# Patient Record
Sex: Female | Born: 1945 | Race: White | Hispanic: No | Marital: Married | Smoking: Never smoker
Health system: Southern US, Community
[De-identification: ages and names within clinical notes are randomized; demographics above are authoritative.]

## PROBLEM LIST (undated history)

## (undated) DIAGNOSIS — E785 Hyperlipidemia, unspecified: Secondary | ICD-10-CM

## (undated) DIAGNOSIS — I4891 Unspecified atrial fibrillation: Secondary | ICD-10-CM

## (undated) DIAGNOSIS — E039 Hypothyroidism, unspecified: Secondary | ICD-10-CM

## (undated) DIAGNOSIS — M810 Age-related osteoporosis without current pathological fracture: Secondary | ICD-10-CM

## (undated) DIAGNOSIS — I10 Essential (primary) hypertension: Secondary | ICD-10-CM

## (undated) DIAGNOSIS — J189 Pneumonia, unspecified organism: Secondary | ICD-10-CM

## (undated) DIAGNOSIS — E079 Disorder of thyroid, unspecified: Secondary | ICD-10-CM

## (undated) HISTORY — DX: Disorder of thyroid, unspecified: E07.9

## (undated) HISTORY — DX: Unspecified atrial fibrillation: I48.91

## (undated) HISTORY — PX: CARDIOVERSION: SHX1299

## (undated) HISTORY — DX: Age-related osteoporosis without current pathological fracture: M81.0

## (undated) HISTORY — DX: Essential (primary) hypertension: I10

## (undated) HISTORY — DX: Hyperlipidemia, unspecified: E78.5

---

## 2010-11-27 ENCOUNTER — Inpatient Hospital Stay (HOSPITAL_COMMUNITY)
Admission: EM | Admit: 2010-11-27 | Discharge: 2010-12-06 | DRG: 194 | Disposition: A | Discharge status: Home / Self Care | Payer: Medicare Other | Attending: Internal Medicine | Admitting: Internal Medicine

## 2010-11-27 ENCOUNTER — Emergency Department (HOSPITAL_COMMUNITY): Admit: 2010-11-27 | Discharge: 2010-11-27 | Disposition: A | Discharge status: Home / Self Care | Payer: Medicare Other

## 2010-11-27 DIAGNOSIS — T380X5A Adverse effect of glucocorticoids and synthetic analogues, initial encounter: Secondary | ICD-10-CM | POA: Diagnosis present

## 2010-11-27 DIAGNOSIS — J90 Pleural effusion, not elsewhere classified: Secondary | ICD-10-CM | POA: Diagnosis present

## 2010-11-27 DIAGNOSIS — R7309 Other abnormal glucose: Secondary | ICD-10-CM | POA: Diagnosis present

## 2010-11-27 DIAGNOSIS — E039 Hypothyroidism, unspecified: Secondary | ICD-10-CM | POA: Diagnosis present

## 2010-11-27 DIAGNOSIS — D509 Iron deficiency anemia, unspecified: Secondary | ICD-10-CM | POA: Diagnosis present

## 2010-11-27 DIAGNOSIS — E236 Other disorders of pituitary gland: Secondary | ICD-10-CM | POA: Diagnosis present

## 2010-11-27 DIAGNOSIS — N390 Urinary tract infection, site not specified: Secondary | ICD-10-CM | POA: Diagnosis present

## 2010-11-27 DIAGNOSIS — J189 Pneumonia, unspecified organism: Principal | ICD-10-CM | POA: Diagnosis present

## 2010-11-27 DIAGNOSIS — J040 Acute laryngitis: Secondary | ICD-10-CM | POA: Diagnosis present

## 2010-11-27 DIAGNOSIS — E876 Hypokalemia: Secondary | ICD-10-CM | POA: Diagnosis present

## 2010-11-27 DIAGNOSIS — I10 Essential (primary) hypertension: Secondary | ICD-10-CM | POA: Diagnosis present

## 2010-11-28 ENCOUNTER — Encounter (HOSPITAL_COMMUNITY): Payer: Self-pay

## 2010-11-28 LAB — URINE MICROSCOPIC-ADD ON

## 2010-11-28 LAB — DIFFERENTIAL
Basophils Absolute: 0 10*3/uL (ref 0.0–0.1)
Basophils Relative: 0 % (ref 0–1)
Lymphocytes Relative: 15 % (ref 12–46)
Monocytes Absolute: 0.8 10*3/uL (ref 0.1–1.0)
Neutro Abs: 3.2 10*3/uL (ref 1.7–7.7)
Neutrophils Relative %: 67 % (ref 43–77)

## 2010-11-28 LAB — BASIC METABOLIC PANEL
BUN: 4 mg/dL — ABNORMAL LOW (ref 6–23)
CO2: 26 mEq/L (ref 19–32)
CO2: 23 mEq/L (ref 19–32)
Calcium: 8.8 mg/dL (ref 8.4–10.5)
Calcium: 8.4 mg/dL (ref 8.4–10.5)
Calcium: 8.5 mg/dL (ref 8.4–10.5)
Creatinine, Ser: 0.63 mg/dL (ref 0.4–1.2)
Creatinine, Ser: 0.61 mg/dL (ref 0.4–1.2)
GFR calc Af Amer: >60 mL/min (ref >60)
GFR calc non Af Amer: >60 mL/min (ref >60)
GFR calc non Af Amer: >60 mL/min (ref >60)
GFR calc non Af Amer: >60 mL/min (ref >60)
Glucose, Bld: 132 mg/dL — ABNORMAL HIGH (ref 70–99)
Glucose, Bld: 143 mg/dL — ABNORMAL HIGH (ref 70–99)
Glucose, Bld: 117 mg/dL — ABNORMAL HIGH (ref 70–99)
Potassium: 2.8 mEq/L — ABNORMAL LOW (ref 3.5–5.1)
Sodium: 121 mEq/L — ABNORMAL LOW (ref 135–145)

## 2010-11-28 LAB — MAGNESIUM: Magnesium: 1.7 mg/dL (ref 1.5–2.5)

## 2010-11-28 LAB — CBC
HCT: 31.2 % — ABNORMAL LOW (ref 36.0–46.0)
Hemoglobin: 11.9 g/dL — ABNORMAL LOW (ref 12.0–15.0)
MCHC: 38.1 g/dL — ABNORMAL HIGH (ref 30.0–36.0)
WBC: 4.7 10*3/uL (ref 4.0–10.5)

## 2010-11-28 LAB — URINALYSIS, ROUTINE W REFLEX MICROSCOPIC
Hgb urine dipstick: NEGATIVE
Nitrite: NEGATIVE
Specific Gravity, Urine: 1.005 (ref 1.005–1.030)
Urobilinogen, UA: 0.2 mg/dL (ref 0.0–1.0)
pH: 5.5 (ref 5.0–8.0)

## 2010-11-28 LAB — HEPATIC FUNCTION PANEL
Bilirubin, Direct: 0.1 mg/dL (ref 0.0–0.3)
Indirect Bilirubin: 0.1 mg/dL — ABNORMAL LOW (ref 0.3–0.9)

## 2010-11-28 LAB — TSH: TSH: 9.818 u[IU]/mL — ABNORMAL HIGH (ref 0.350–4.500)

## 2010-11-28 LAB — T4, FREE: Free T4: 0.83 ng/dL (ref 0.80–1.80)

## 2010-11-29 ENCOUNTER — Inpatient Hospital Stay (HOSPITAL_COMMUNITY): Payer: Medicare Other

## 2010-11-29 LAB — VITAMIN B12: Vitamin B-12: 475 pg/mL (ref 211–911)

## 2010-11-29 LAB — BASIC METABOLIC PANEL
BUN: 4 mg/dL — ABNORMAL LOW (ref 6–23)
GFR calc Af Amer: >60 mL/min (ref >60)
GFR calc non Af Amer: >60 mL/min (ref >60)
Potassium: 4 mEq/L (ref 3.5–5.1)

## 2010-11-29 LAB — CBC
MCV: 86.5 fL (ref 78.0–100.0)
Platelets: 162 10*3/uL (ref 150–400)
RBC: 3.41 MIL/uL — ABNORMAL LOW (ref 3.87–5.11)
WBC: 4.2 10*3/uL (ref 4.0–10.5)

## 2010-11-29 LAB — DIFFERENTIAL
Basophils Relative: 0 % (ref 0–1)
Eosinophils Absolute: 0 10*3/uL (ref 0.0–0.7)
Lymphs Abs: 0.6 10*3/uL — ABNORMAL LOW (ref 0.7–4.0)
Neutrophils Relative %: 71 % (ref 43–77)

## 2010-11-29 LAB — OSMOLALITY, URINE: Osmolality, Ur: 489 mOsm/kg (ref 390–1090)

## 2010-11-29 LAB — URIC ACID: Uric Acid, Serum: 2.8 mg/dL (ref 2.4–7.0)

## 2010-11-29 LAB — CORTISOL: Cortisol, Plasma: 13.4 ug/dL

## 2010-11-30 LAB — DIFFERENTIAL
Basophils Absolute: 0 10*3/uL (ref 0.0–0.1)
Eosinophils Absolute: 0 10*3/uL (ref 0.0–0.7)
Eosinophils Relative: 0 % (ref 0–5)

## 2010-11-30 LAB — BASIC METABOLIC PANEL
BUN: 1 mg/dL — ABNORMAL LOW (ref 6–23)
CO2: 24 mEq/L (ref 19–32)
Calcium: 8.9 mg/dL (ref 8.4–10.5)
Chloride: 96 mEq/L (ref 96–112)
Creatinine, Ser: 0.54 mg/dL (ref 0.4–1.2)
GFR calc Af Amer: >60 mL/min (ref >60)
Glucose, Bld: 120 mg/dL — ABNORMAL HIGH (ref 70–99)

## 2010-11-30 LAB — CBC
MCH: 31.7 pg (ref 26.0–34.0)
MCHC: 36.5 g/dL — ABNORMAL HIGH (ref 30.0–36.0)
MCV: 87 fL (ref 78.0–100.0)
WBC: 6.3 10*3/uL (ref 4.0–10.5)

## 2010-11-30 LAB — URINE CULTURE
Culture: NO GROWTH
Special Requests: NEGATIVE

## 2010-12-01 LAB — CBC
HCT: 29.9 % — ABNORMAL LOW (ref 36.0–46.0)
MCV: 85.7 fL (ref 78.0–100.0)
Platelets: 193 10*3/uL (ref 150–400)
RBC: 3.49 MIL/uL — ABNORMAL LOW (ref 3.87–5.11)
RDW: 13.3 % (ref 11.5–15.5)
WBC: 7.2 10*3/uL (ref 4.0–10.5)

## 2010-12-01 LAB — DIFFERENTIAL
Basophils Absolute: 0 10*3/uL (ref 0.0–0.1)
Eosinophils Relative: 0 % (ref 0–5)
Lymphocytes Relative: 10 % — ABNORMAL LOW (ref 12–46)
Lymphs Abs: 0.7 10*3/uL (ref 0.7–4.0)
Neutrophils Relative %: 79 % — ABNORMAL HIGH (ref 43–77)

## 2010-12-02 ENCOUNTER — Inpatient Hospital Stay (HOSPITAL_COMMUNITY): Payer: Medicare Other

## 2010-12-02 LAB — URINALYSIS, MICROSCOPIC ONLY
Leukocytes, UA: NEGATIVE
Nitrite: NEGATIVE
Specific Gravity, Urine: 1.01 (ref 1.005–1.030)
Urine Glucose, Fasting: NEGATIVE mg/dL
pH: 7 (ref 5.0–8.0)

## 2010-12-02 LAB — BASIC METABOLIC PANEL
Chloride: 89 mEq/L — ABNORMAL LOW (ref 96–112)
GFR calc non Af Amer: >60 mL/min (ref >60)
Potassium: 3.5 mEq/L (ref 3.5–5.1)
Sodium: 124 mEq/L — ABNORMAL LOW (ref 135–145)

## 2010-12-02 LAB — BRAIN NATRIURETIC PEPTIDE: Pro B Natriuretic peptide (BNP): 364 pg/mL — ABNORMAL HIGH (ref 0.0–100.0)

## 2010-12-02 MED ORDER — IOHEXOL 300 MG/ML  SOLN
80.0000 mL | Freq: Once | INTRAMUSCULAR | Status: AC | PRN
  Administered 2010-12-02: 80 mL via INTRAVENOUS

## 2010-12-03 LAB — VANCOMYCIN, TROUGH: Vancomycin Tr: 5.2 ug/mL — ABNORMAL LOW (ref 10.0–20.0)

## 2010-12-03 LAB — BASIC METABOLIC PANEL
Calcium: 8.9 mg/dL (ref 8.4–10.5)
GFR calc Af Amer: >60 mL/min (ref >60)
GFR calc non Af Amer: >60 mL/min (ref >60)
Glucose, Bld: 120 mg/dL — ABNORMAL HIGH (ref 70–99)
Sodium: 129 mEq/L — ABNORMAL LOW (ref 135–145)

## 2010-12-03 LAB — CULTURE, BLOOD (ROUTINE X 2)

## 2010-12-03 LAB — INFLUENZA PANEL BY PCR (TYPE A & B)
H1N1 flu by pcr: NOT DETECTED
Influenza A By PCR: NEGATIVE

## 2010-12-04 LAB — BASIC METABOLIC PANEL
BUN: 5 mg/dL — ABNORMAL LOW (ref 6–23)
Creatinine, Ser: 0.5 mg/dL (ref 0.4–1.2)
GFR calc non Af Amer: >60 mL/min (ref >60)
Glucose, Bld: 115 mg/dL — ABNORMAL HIGH (ref 70–99)
Potassium: 3.1 mEq/L — ABNORMAL LOW (ref 3.5–5.1)

## 2010-12-04 LAB — LEGIONELLA ANTIGEN, URINE: Legionella Antigen, Urine: NEGATIVE

## 2010-12-04 LAB — MAGNESIUM: Magnesium: 2.5 mg/dL (ref 1.5–2.5)

## 2010-12-04 LAB — VANCOMYCIN, TROUGH: Vancomycin Tr: <5 ug/mL — ABNORMAL LOW (ref 10.0–20.0)

## 2010-12-05 LAB — BASIC METABOLIC PANEL
BUN: 4 mg/dL — ABNORMAL LOW (ref 6–23)
Calcium: 8.6 mg/dL (ref 8.4–10.5)
Creatinine, Ser: 0.55 mg/dL (ref 0.4–1.2)
Creatinine, Ser: 0.66 mg/dL (ref 0.4–1.2)
GFR calc Af Amer: >60 mL/min (ref >60)
GFR calc non Af Amer: >60 mL/min (ref >60)
Potassium: 2.8 mEq/L — ABNORMAL LOW (ref 3.5–5.1)

## 2010-12-05 NOTE — H&P (Signed)
NAME:  April Duke, April Duke                 ACCOUNT NO.:  [id number]  MEDICAL RECORD NO.:  [id number]           PATIENT TYPE:  I  LOCATION:  A318                          FACILITY:  [facility]  PHYSICIAN:  Hollice Espy, M.D.DATE OF BIRTH:  12/27/1945  DATE OF ADMISSION:  11/28/2010 DATE OF DISCHARGE:  LH                             HISTORY & PHYSICAL   ATTENDING PHYSICIAN:  Hollice Espy, MD  PATIENT'S PCP:  Delaney Meigs, MD  CHIEF COMPLAINT:  Fever and cough.  HISTORY OF PRESENT ILLNESS:  The patient is a 64-year white female, past medical history of hypertension, hypothyroidism, and has been recently treated for several days for a urinary tract infection, who presents with weakness as well as a cough and fever.  When she came into the emergency room, she was noted to have a temp of 100.7, normal white count, however, most concerning was being getting her blood work, she was noted to have sodium of 121, it was likely felt that she had some dehydration in combination with her medications causing low sodium and hospitalist was called for evaluation and admission.  When I saw the patient, she was doing okay.  She denied any headaches, vision changes. No dysphagia.  No chest pain, palpitations, but did complain of cough, but no shortness of breath, some mild wheezing, but no abdominal pain. No hematuria and she had no dysuria.  No constipation, diarrhea.  No focal extremity numbness, weakness, or pain, although she felt quite fatigued.  REVIEW OF SYSTEMS:  Otherwise negative.  PAST MEDICAL HISTORY:  Includes hypertension, hypothyroidism, and a recent urinary tract infection.  MEDICATIONS: 1. The patient is on captopril 50. 2. Atenolol 50. 3. Crestor 25. 4. Synthroid 25 mcg. 5. HCTZ 25. 6. Cipro 250 daily.  She has an allergy to SULFA.  SOCIAL HISTORY:  She denies any tobacco, alcohol, or drug use.  FAMILY HISTORY:  Noted for hypertension, mom had a cancer of  unknown primary.  PHYSICAL EXAMINATION:  VITAL SIGNS:  On admission, temp 100.7, heart rate 74, blood pressure 159/72, respirations 18, O2 sat 99% on room air. GENERAL:  She is alert and oriented x3 in no apparent distress. HEENT:  Normocephalic, atraumatic.  Mucous membranes are slightly dry. She has no carotid bruits. HEART:  Regular rate and rhythm, S1 and S2. LUNGS:  Bilateral mild end-expiratory wheezing. ABDOMEN:  Soft, nontender, nondistended.  Positive bowel sounds. EXTREMITIES:  Show no clubbing, cyanosis, or edema.  LABORATORY WORK:  Sodium 121, potassium 2.8, chloride 84, bicarb 26, the patient is noted to not have an anion gap, BUN 7, creatinine 0.6, glucose 132.  White count 4.7, H and H is 12 and 31, MCV of 84, platelet count of 53, no shift.  Urinalysis and TSH have been ordered, both of which are pending.  Chest x-ray notes COPD with cardiomegaly, but no active infiltrates.  ASSESSMENT AND PLAN: 1. Hyponatremia, likely this is a combination of dehydration brought     on by upper respiratory infection plus her hydrochlorothiazide.  We     will gently hydrate the patient.  Recheck a sodium  in 12 hours and     hold her HCTZ indefinitely. 2. Bronchitis, it is possible her fever could be from untreated     urinary tract infection given her normal white count however and     her upper respiratory symptoms and her findings on chest x-ray.  We     will plan to treat with IV antibiotics for now which should cover     both. 3. Hypertension, again we will hold her HCTZ, continue her beta-     blocker and captopril. 4. Recent urinary tract infection.  It is clear, she is on 250 daily.     She has no renal insufficiency.  She should be more on 500 q.12     hours, but again the Rocephin and Zithromax will cover both. 5. Hypothyroidism.  TSH is pending. 6. Hypokalemia.  We will replace.     Hollice Espy, M.D.     SKK/MEDQ  D:  11/28/2010  T:  11/28/2010  Job:   [id number]  cc:   Delaney Meigs, M.D. Fax: [phone]  Electronically Signed by Virginia Rochester M.D. on 11/30/2010 11:10:01 AM

## 2010-12-06 ENCOUNTER — Inpatient Hospital Stay (HOSPITAL_COMMUNITY): Payer: Medicare Other

## 2010-12-06 LAB — BASIC METABOLIC PANEL
BUN: 5 mg/dL — ABNORMAL LOW (ref 6–23)
CO2: 29 mEq/L (ref 19–32)
Chloride: 90 mEq/L — ABNORMAL LOW (ref 96–112)
Creatinine, Ser: 0.55 mg/dL (ref 0.4–1.2)
Glucose, Bld: 164 mg/dL — ABNORMAL HIGH (ref 70–99)
Potassium: 3.2 mEq/L — ABNORMAL LOW (ref 3.5–5.1)

## 2010-12-07 LAB — CULTURE, BLOOD (ROUTINE X 2): Culture: NO GROWTH

## 2010-12-11 NOTE — Discharge Summary (Signed)
NAME:  April Duke, COCKERILL                 ACCOUNT NO.:  [id number]  MEDICAL RECORD NO.:  [id number]           PATIENT TYPE:  I  LOCATION:  A311                          FACILITY:  [facility]  PHYSICIAN:  Elliot Cousin, M.D.    DATE OF BIRTH:  Mar 10, 1946  DATE OF ADMISSION:  11/27/2010 DATE OF DISCHARGE:  02/12/2012LH                              DISCHARGE SUMMARY   DISCHARGE DIAGNOSES: 1. Bilateral pneumonia with concomitant peribronchial thickening and a     small bilateral pleural effusions. 2. Urinary tract infection. 3. Laryngitis 4. Malignant hypertension. 5. Elevated inflammatory markers (the patient's C-reactive protein was     26.3, less than 0.6 being normal;  her sed rate was 120 with     0-22 being the normal range). 6. 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  The     patient's serum sodium was 121 on admission and 130 at the time of     discharge. 7. Hypokalemia.  The patient's serum potassium was 3.2 at the time of     discharge. 8. Hyperglycemia secondary to steroids.  The patient's venous glucose     will need reassessment off of prednisone. 9. Iron deficiency anemia.  The patient's total iron was less than 10,     vitamin B12 was 475, and ferritin was 706. 10.Hypothyroidism.  The patient has a history of hypothyroidism.  Her     TSH was elevated at 9.8 and her free T4 was on the lower end of     normal at 0.83.  DISCHARGE MEDICATIONS: 1. Albuterol inhaler 2 puffs every 4 hours as needed for shortness of     breath and chest congestion (new medication). 2. Amlodipine 5 mg daily (new medication). 3. Tessalon Perles 100 mg 1 tablet 3 times daily as needed for cough     (new medication). 4. Doxycycline 100 mg b.i.d. for 6 more days. 5. Multivitamin with iron 1 tablet daily. 6. Potassium chloride 20 mEq b.i.d. for 7 more days (the patient will     need to have her serum potassium rechecked  in 1 week). 7. Prednisone 10-mg tablets, taper as directed over the next 5 days. 8. Captopril.  The dose was increased from 50 mg once a day to 50 mg 3     times a day. 9. Synthroid.  The dose was increased from 25 mcg daily to 50 mcg     daily. 10.Atenolol 50 mg daily. 11.Calcium 600 mg daily. 12.Crestor 5 mg daily. 13.Stop hydrochlorothiazide. 14.Stop Cipro.  DISCHARGE DISPOSITION:  The patient was discharged home in improved and stable condition on December 06, 2010.  She will follow up with her primary care physician, Dr. Lysbeth Galas, as scheduled on December 14, 2010.  CONSULTATIONS:  None.  PROCEDURE PERFORMED: 1. Chest x-ray on December 06, 2010.  The results revealed bilateral     lower lobe airspace disease compatible with pneumonia.  Small     bilateral pleural effusions.  Peribronchial thickening. 2. CT of the chest with contrast on December 02, 2010.  The results  revealed multifocal airspace disease compatible with pneumonia.     Airspace consolidation is greater in the right lower lobe.     Bilateral pleural effusions.  Degenerative changes of the thoracic     spine with posterior endplate spurring at the T6-T7 contacting and     distorting the ventral surface of the cord.  Nodular ground-glass     attenuation within the superior segment of the left lower lobe of     the lung and ground-glass attenuation within the upper lobes     bilaterally.  HISTORY OF PRESENT ILLNESS:  The patient is a April Duke with a past medical history significant for hypertension and hypothyroidism. She had been recently started on Cipro for several days for treatment of urinary tract infection.  She presented to the emergency department on November 28, 2010, with a chief complaint of fever and cough.  When she was initially evaluated, she was noted to be febrile with a temperature of 100.7.  Otherwise, she was hemodynamically stable.  Her chest x-ray revealed COPD and cardiomegaly,  but no active infiltrates.  Her WBC was 4.7.  Her urinalysis revealed 3-6 wbc's.  Serum sodium was 121 and her serum potassium was 2.8.  She was admitted for further evaluation and management.  HOSPITAL COURSE: 1. BILATERAL PNEUMONIA, URINARY TRACT INFECTION, AND LARYNGITIS.  The     patient was started empirically on Rocephin and azithromycin to     cover both a possible acute bronchitis or pneumonia that had not     fluffed out yet and the partially treated urinary tract infection.     A urine culture was ordered.  It revealed no growth.  The patient     remained febrile on Rocephin and azithromycin and therefore     vancomycin was added empirically.  Blood cultures were ordered when     her temperature increased to 102.7.  The blood cultures remained     without any growth for 5 days.  A followup chest x-ray 1 day after     admission revealed new or progressive right lower lobe airspace     disease consistent with infection.  Her oxygen saturations were     generally ranging from 93 to 99% on 2-3 liters of oxygen.  She     really had no complaints of shortness of breath but she did     complain of a cough and generalized weakness.  Albuterol     bronchodilator therapy was added as needed.  Tessalon Perles were     given 3 times daily for her persistent cough.  Antibiotic therapy     was changed several times due to her persistent fever.  Zosyn was     added to vancomycin while Rocephin was discontinued.  Another set     of blood cultures were ordered on December 02, 2010, and as of the     time of this dictation, they have remained without any growth.  A     CT of her chest was ordered for further clarification.  The results     were dictated above.  However in essence, the CT revealed     multifocal airspace disease and small pleural effusions and     associated ground-glass opacities.  Additional studies were     ordered.  Influenza A and B PCR were both negative.  Her sed rate      was elevated at 120.  Her C-reactive protein was quite elevated at  26.3.  Her Legionella antigen was negative.  Due to the elevations     in her inflammatory markers, there was some concern about an     organizing pneumonia versus an autoimmune phenomenon versus an     immunodeficiency disorder.  Therefore, HIV antibody, rheumatoid     factor, and ANA were ordered.  All of the results are currently     pending.  Prednisone was added empirically.  Intravenous     antibiotics were discontinued in favor of Augmentin and     doxycycline.  Her followup chest x-ray prior to discharge revealed     persistent bilateral lower lobe airspace disease.  The patient has     improved symptomatically .  She has been afebrile over 24 hours.     She is oxygenating 93% on room air.  She is insistent on going home     today.  She received a total of 10 days of antibiotic therapy.  She     was discharged home on doxycycline 100 mg b.i.d.  Of note, the     patient did develop laryngitis.  She had no complaints of sore     throat.  Upon my examination, there was no evidence of posterior     pharyngeal erythema, edema, or exudates.  The patient would benefit     from a followup chest x-ray or CT of the chest in 4-6 weeks to     assess for interval improvement. 2. MALIGNANT HYPERTENSION.  The patient was maintained on atenolol,     however, hydrochlorothiazide was discontinued in light of the     severe hyponatremia and hypokalemia.  At the time of the initial     assessment, the dose of captopril was unknown and therefore was not     restarted until the dose was confirmed.  She was therefore     restarted on captopril 50 mg daily.  Her blood pressure became     quite elevated.  The IV fluids were tapered off.  Captopril was     increased to three times daily.  Norvasc was added at 5 mg daily.     The dose of atenolol was increased to 100 mg.  The patient was     somewhat resistant to the increases in  captopril and atenolol.  She     did finally accept the increase in captopril, however, she refused     to take the higher dose of atenolol.  Hydrochlorothiazide was     discontinued which may have attributed to the increasing blood     pressures.  I encouraged the patient to take the medications as     prescribed because of the stroke risk associated with malignant     hypertension.  She still was not receptive to increasing the dose     of atenolol.  At the time of this dictation, her blood pressure is     195/82.  Further management will be deferred to her primary care     physician, Dr. Lysbeth Galas. 3. HYPOKALEMIA AND HYPONATREMIA.  On admission, the patient's sodium     was 121 and her potassium was 2.8.  She was started on gentle IV     fluids with normal saline.  She was repleted with potassium     chloride orally.  A blood magnesium level was ordered and it was     within normal limits at 1.7.  After approximately 24-36 hours of IV  fluids, her sodium level improved to 127, but the next day it     decreased to 125.  The IV fluids were discontinued as it appeared     that the patient had been amply hydrated.  A number of studies were     ordered to assess her hyponatremia.  Her TSH was elevated at 9.8     with a borderline low free T4 of 0.83.  The dose of Synthroid was     increased to 37.5 and then subsequently to 50 mcg daily.  Her uric     acid level was borderline low at 2.8.  Her urine sodium was 148.     Her cortisol level was within normal limits at 13.4.  Her serum     osmolality was low at 265.  Her urine osmolality was normal at 489.     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  It was also noted that she drank quite a bit     of water and certainly polydipsia could have played a role.  Also,     HCTZ may have contributed to her initial hyponatremia.     Nevertheless, the patient was started on fluid restrictions.  Her      serum sodium did increase to 132 but then decreased again to 126.     Prior to discharge, it stabilized at 130.  Her serum potassium also     improved but decreased again to 3.2 despite multiple doses of oral     potassium chloride.  She was discharged home on potassium chloride     supplementation.  She will need to have her serum potassium and     sodium reassessed in 1 week. 4. IRON DEFICIENCY ANEMIA.  The patient's hemoglobin ranged from 11.9     to 10.8.  Her total iron was low at less than 10, ferritin was 706,     and vitamin B12 was 475.  She was not started on iron     supplementation due to the infections.  She was advised to take a     multivitamin with iron once daily.  Further assessment and/or     management will be deferred to her primary care physician, Dr.     Lysbeth Galas. 5. HYPOTHYROIDISM.  As stated above, the patient's TSH was elevated     and her free T4 was borderline low.  The dose of Synthroid was     increased to 50 mcg daily.  A followup TSH and free T4 is     recommended in 4-5 weeks. 6. Hyperglycemia.  The patient's venous glucose increased to the 150s-     160s on prednisone.  Her venous glucose will need to be reassessed     off of steroid therapy.  DISCHARGE LABORATORY RESULTS:  Sodium 130, potassium 3.2, chloride 90, CO2 29, glucose 164, BUN 5, creatinine 0.55, calcium 9.0.  Blood cultures negative x4 days.  LABORATORY RESULTS PENDING:  HIV antibody, ANA, and rheumatoid factor.     Elliot Cousin, M.D.     DF/MEDQ  D:  12/06/2010  T:  12/07/2010  Job:  [id number]  cc:   Delaney Meigs, M.D. Fax: [phone]  Electronically Signed by Elliot Cousin M.D. on 12/11/2010 09:15:13 AM

## 2010-12-20 NOTE — Progress Notes (Signed)
NAME:  April Duke, April Duke                 ACCOUNT NO.:  [id number]  MEDICAL RECORD NO.:  [id number]           PATIENT TYPE:  I  LOCATION:  A311                          FACILITY:  [facility]  PHYSICIAN:  Tynell Winchell L. Lendell Caprice, MDDATE OF BIRTH:  1945-12-12  DATE OF PROCEDURE:  12/05/2010 DATE OF DISCHARGE:  11/27/2010                                PROGRESS NOTE   SUBJECTIVE:  Ms. Aguinaldo feels better today.  She still is hoarse and can only whisper.  She still has a cough.  PHYSICAL EXAMINATION:  VITAL SIGNS:  Temperature is 101.4, blood pressure is 197/91, oxygenation is 96% on 2 L on room air, she drops to 85.  Earlier in the day, she appeared comfortable, nontoxic and had clear lung sounds.  Currently upon recheck, she appears more ill.  She has some wheezing.  She has right-sided pleural rub, some rhonchi. CARDIOVASCULAR:  Regular rate and rhythm without murmurs, gallops or rubs. ABDOMEN:  Normal bowel sounds, soft, nontender and nondistended. EXTREMITIES:  No clubbing, cyanosis or edema.  LABORATORY DATA:  Sodium is 126, potassium 3.1, chloride 86, bicarbonate 27, glucose 196, BUN 4, creatinine 0.66.  Her CRP from yesterday is 26. HIV is pending.  Urine Legionella antigen negative.  Flu swab negative. ESR is 120.  ASSESSMENT AND PLAN: 1. Continued fevers despite 12 days of antibiotic including Cipro for     earlier urinary tract infection and subsequently Rocephin and     azithromycin.  She has had 5 days of azithromycin and been switched     to vancomycin and Zosyn, but continues to spikes fevers and has     continued worsening infiltrates based on her lung imaging on the     [facility].  Also, her inflammatory markers are quite high.  She did have     ground-glass opacities on her CT of the chest, but no empyema.  I     am concerned about organizing pneumonia or an autoimmune     phenomenon.  She is wheezing currently and I will start prednisone     which would help hopefully all of the above.   I will check a     rheumatoid factor and ANA.  We also need to follow up the human     immunodeficiency virus and if positive put her on Bactrim, but the     clinical picture is inconsistent with Pneumocystis pneumonia.  The     patient has limited understanding, but I have tried to explain to     her the reasoning behind her medication changes.  Initially earlier     today, I was going to discharge her home as she had not had a fever     until recently again.  I had changed her over to Augmentin and     doxycycline in preparation for a possible discharge today or     tomorrow.  I will keep her on this for now and repeat a PA and     lateral chest x-ray. 2. Community acquired pneumonia, failing to improve on maximal medical     therapy.  See above. 3. Difficult to control hypertension.  The patient is very hesitant to     accelerate any of her medications, but I have explained to her the     risk of running high blood pressures and I will place her on     Norvasc.  I have already doubled her atenolol dose and changed her     captopril to t.i.d.  She was only taking it once daily previously. 4. 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  She is on a higher dose of Synthroid     and has been off     her hydrochlorothiazide. 5. Hypokalemia being repleted.  Her magnesium level is normal. 6. Hypoxia, see above.     Kellyn Mccary L. Lendell Caprice, MD     CLS/MEDQ  D:  12/05/2010  T:  12/06/2010  Job:  [id number]  Electronically Signed by Crista Curb MD on 12/20/2010 09:29:30 PM

## 2011-01-18 ENCOUNTER — Other Ambulatory Visit (HOSPITAL_COMMUNITY): Payer: Self-pay | Admitting: Family Medicine

## 2011-01-18 ENCOUNTER — Ambulatory Visit (HOSPITAL_COMMUNITY)
Admission: RE | Admit: 2011-01-18 | Discharge: 2011-01-18 | Disposition: A | Discharge status: Home / Self Care | Payer: Medicare Other | Source: Ambulatory Visit | Attending: Family Medicine | Admitting: Family Medicine

## 2011-01-18 DIAGNOSIS — J189 Pneumonia, unspecified organism: Secondary | ICD-10-CM

## 2014-07-02 DIAGNOSIS — K219 Gastro-esophageal reflux disease without esophagitis: Secondary | ICD-10-CM | POA: Insufficient documentation

## 2014-11-06 DIAGNOSIS — J0191 Acute recurrent sinusitis, unspecified: Secondary | ICD-10-CM | POA: Insufficient documentation

## 2015-12-22 DIAGNOSIS — Z8679 Personal history of other diseases of the circulatory system: Secondary | ICD-10-CM | POA: Insufficient documentation

## 2019-04-26 ENCOUNTER — Other Ambulatory Visit: Payer: Self-pay

## 2019-04-27 ENCOUNTER — Ambulatory Visit (INDEPENDENT_AMBULATORY_CARE_PROVIDER_SITE_OTHER): Payer: Medicare Other | Admitting: Family Medicine

## 2019-04-27 ENCOUNTER — Encounter: Payer: Self-pay | Admitting: Family Medicine

## 2019-04-27 DIAGNOSIS — I10 Essential (primary) hypertension: Secondary | ICD-10-CM | POA: Diagnosis not present

## 2019-04-27 DIAGNOSIS — I4891 Unspecified atrial fibrillation: Secondary | ICD-10-CM | POA: Insufficient documentation

## 2019-04-27 DIAGNOSIS — E039 Hypothyroidism, unspecified: Secondary | ICD-10-CM | POA: Insufficient documentation

## 2019-04-27 DIAGNOSIS — E782 Mixed hyperlipidemia: Secondary | ICD-10-CM

## 2019-04-27 DIAGNOSIS — M810 Age-related osteoporosis without current pathological fracture: Secondary | ICD-10-CM | POA: Insufficient documentation

## 2019-04-27 DIAGNOSIS — I480 Paroxysmal atrial fibrillation: Secondary | ICD-10-CM | POA: Diagnosis not present

## 2019-04-27 DIAGNOSIS — E785 Hyperlipidemia, unspecified: Secondary | ICD-10-CM | POA: Insufficient documentation

## 2019-04-27 NOTE — Progress Notes (Signed)
BP (!) 163/87   Pulse 88   Temp 98 F (36.7 C) (Oral)   Ht 5' 3.5" (1.613 m)   Wt 144 lb 9.6 oz (65.6 kg)   BMI 25.21 kg/m    Subjective:    Patient ID: April Duke, female    DOB: 05-31-1946, 73 y.o.   MRN: [id number]  HPI: April Duke is a 73 y.o. female presenting on 04/27/2019 for New Patient (Initial Visit) ([place]- no complaints) and Establish Care   HPI Hypothyroidism recheck Patient is coming in for thyroid recheck today as well. They deny any issues with hair changes or heat or cold problems or diarrhea or constipation. They deny any chest pain or palpitations. They are currently on levothyroxine 42micrograms   Hyperlipidemia Patient is coming in for recheck of his hyperlipidemia. The patient is currently taking Crestor. They deny any issues with myalgias or history of liver damage from it. They deny any focal numbness or weakness or chest pain.   Hypertension Patient is currently on amlodipine and atenolol and captopril, and their blood pressure today is 163/87. Patient denies any lightheadedness or dizziness. Patient denies headaches, blurred vision, chest pains, shortness of breath, or weakness. Denies any side effects from medication and is content with current medication.   Patient has A. fib and wants to get a cardiologist in our system.  She says occasionally she will feel a flutter but most the time she does not feel a flutter.  She is currently taking Xarelto for this.  She denies any chest pain or shortness of breath.  Patient also has a diagnosis of osteoporosis but is not currently on any medication because she is previously tried oral did not tolerate them well and had a lot of side effects with them.  Relevant past medical, surgical, family and social history reviewed and updated as indicated. Interim medical history since our last visit reviewed. Allergies and medications reviewed and updated.  Review of Systems  Constitutional: Negative for chills and fever.   HENT: Negative for congestion, ear discharge and ear pain.   Eyes: Negative for redness and visual disturbance.  Respiratory: Negative for chest tightness and shortness of breath.   Cardiovascular: Negative for chest pain and leg swelling.  Genitourinary: Negative for difficulty urinating and dysuria.  Musculoskeletal: Negative for back pain and gait problem.  Skin: Negative for rash.  Neurological: Negative for light-headedness and headaches.  Psychiatric/Behavioral: Negative for agitation and behavioral problems.  All other systems reviewed and are negative.   Per HPI unless specifically indicated above  Social History   Socioeconomic History  . Marital status: Married    Spouse name: Not on file  . Number of children: Not on file  . Years of education: Not on file  . Highest education level: Not on file  Occupational History  . Not on file  Social Needs  . Financial resource strain: Not on file  . Food insecurity    Worry: Not on file    Inability: Not on file  . Transportation needs    Medical: Not on file    Non-medical: Not on file  Tobacco Use  . Smoking status: Never Smoker  . Smokeless tobacco: Never Used  Substance and Sexual Activity  . Alcohol use: Never    Frequency: Never  . Drug use: Never  . Sexual activity: Not on file  Lifestyle  . Physical activity    Days per week: Not on file    Minutes per session: Not on  file  . Stress: Not on file  Relationships  . Social Musicianconnections    Talks on phone: Not on file    Gets together: Not on file    Attends religious service: Not on file    Active member of club or organization: Not on file    Attends meetings of clubs or organizations: Not on file    Relationship status: Not on file  . Intimate partner violence    Fear of current or ex partner: Not on file    Emotionally abused: Not on file    Physically abused: Not on file    Forced sexual activity: Not on file  Other Topics Concern  . Not on file   Social History Narrative  . Not on file    History reviewed. No pertinent surgical history.  Family History  Problem Relation Age of Onset  . Cancer Mother        breast  . Hypertension Mother   . Hypertension Father     Allergies as of 04/27/2019      Reactions   Sulfa Antibiotics Rash      Medication List       Accurate as of April 27, 2019  1:45 PM. If you have any questions, ask your nurse or doctor.        amLODipine 5 MG tablet Commonly known as: NORVASC Take 1 tablet by mouth daily.   atenolol 50 MG tablet Commonly known as: TENORMIN Take 1 tablet by mouth daily.   calcium carbonate 1500 (600 Ca) MG Tabs tablet Commonly known as: OSCAL Take by mouth 2 (two) times daily with a meal.   captopril 25 MG tablet Commonly known as: CAPOTEN Take 1 tablet by mouth daily.   multivitamin tablet Take 1 tablet by mouth daily.   rosuvastatin 5 MG tablet Commonly known as: CRESTOR Take 1 tablet by mouth daily.   Synthroid 50 MCG tablet Generic drug: levothyroxine Take 1 tablet by mouth daily.   Xarelto 20 MG Tabs tablet Generic drug: rivaroxaban Take 20 mg by mouth daily.          Objective:    BP (!) 163/87   Pulse 88   Temp 98 F (36.7 C) (Oral)   Ht 5' 3.5" (1.613 m)   Wt 144 lb 9.6 oz (65.6 kg)   BMI 25.21 kg/m   Wt Readings from Last 3 Encounters:  04/27/19 144 lb 9.6 oz (65.6 kg)    Physical Exam Vitals signs and nursing note reviewed.  Constitutional:      General: She is not in acute distress.    Appearance: She is well-developed. She is not diaphoretic.  Eyes:     Conjunctiva/sclera: Conjunctivae normal.  Cardiovascular:     Rate and Rhythm: Normal rate and regular rhythm.     Heart sounds: Normal heart sounds. No murmur.  Pulmonary:     Effort: Pulmonary effort is normal. No respiratory distress.     Breath sounds: Normal breath sounds. No wheezing.  Musculoskeletal: Normal range of motion.        General: No tenderness.  Skin:     General: Skin is warm and dry.     Findings: No rash.  Neurological:     Mental Status: She is alert and oriented to person, place, and time.     Coordination: Coordination normal.  Psychiatric:        Behavior: Behavior normal.         Assessment & Plan:  Problem List Items Addressed This Visit      Cardiovascular and Mediastinum   A-fib (HCC)   Relevant Medications   XARELTO 20 MG TABS tablet   amLODipine (NORVASC) 5 MG tablet   captopril (CAPOTEN) 25 MG tablet   rosuvastatin (CRESTOR) 5 MG tablet   atenolol (TENORMIN) 50 MG tablet   Other Relevant Orders   Ambulatory referral to Cardiology   HTN (hypertension)   Relevant Medications   XARELTO 20 MG TABS tablet   amLODipine (NORVASC) 5 MG tablet   captopril (CAPOTEN) 25 MG tablet   rosuvastatin (CRESTOR) 5 MG tablet   atenolol (TENORMIN) 50 MG tablet   Other Relevant Orders   Ambulatory referral to Cardiology     Endocrine   Hypothyroid   Relevant Medications   atenolol (TENORMIN) 50 MG tablet   levothyroxine (SYNTHROID) 50 MCG tablet     Musculoskeletal and Integument   Osteoporosis   Relevant Medications   calcium carbonate (OSCAL) 1500 (600 Ca) MG TABS tablet     Other   Hyperlipemia   Relevant Medications   XARELTO 20 MG TABS tablet   amLODipine (NORVASC) 5 MG tablet   captopril (CAPOTEN) 25 MG tablet   rosuvastatin (CRESTOR) 5 MG tablet   atenolol (TENORMIN) 50 MG tablet      Continue current medication, no changes currently.   Will establish with cardiology in [facility] Follow up plan: Return in about 4 months (around 08/28/2019), or if symptoms worsen or fail to improve, for htn and afib.  Arville CareJoshua Avan Gullett, MD [facility] 04/27/2019, 1:45 PM

## 2019-05-08 NOTE — Progress Notes (Signed)
Cardiology Office Note   Date:  05/09/2019   ID:  Daven Pinckney, DOB [birth year], MRN [id number]  PCP:  Dettinger, Fransisca Kaufmann, MD  Cardiologist:   No primary care provider on file. Referring:  Dettinger, Fransisca Kaufmann, MD  No chief complaint on file.     History of Present Illness: April Duke is a 73 y.o. female who is referred by Dettinger, Fransisca Kaufmann, MD for evaluation of atrial fib.  Marland Kitchen  She was previously seen by Dr. Eulogio Ditch at [place].  I reviewed these records for this visit.   The patient has been on anticoagulation for several years.  She has had cardioversion in the past.    She says that she does well with fibrillation.  She feels that sometimes it becomes fast.  She is been instructed to take an extra half of atenolol if it is going fast but she rarely has to do this.  She does not want to take a lot of medications.  She does not switch a lot of medicines that she is done well with her respiratory.  She does take a blood thinner.  She not had any problems with this.  She typically does not feel palpitations and has no presyncope or syncope.  She denies any chest pressure, neck or arm discomfort.  She has no weight gain or edema.  She exercises by walking every day.   Past Medical History:  Diagnosis Date  . Atrial fibrillation ([place])   . Hyperlipidemia   . Hypertension   . Osteoporosis   . Thyroid disease     No past surgical history on file.   Current Outpatient Medications  Medication Sig Dispense Refill  . amLODipine (NORVASC) 5 MG tablet Take 1 tablet by mouth daily.    Marland Kitchen atenolol (TENORMIN) 50 MG tablet Take 1 tablet by mouth daily.    . calcium carbonate (OSCAL) 1500 (600 Ca) MG TABS tablet Take by mouth 2 (two) times daily with a meal.    . captopril (CAPOTEN) 25 MG tablet Take 1 tablet by mouth daily.    Marland Kitchen levothyroxine (SYNTHROID) 50 MCG tablet Take 1 tablet by mouth daily.    . Multiple Vitamin (MULTIVITAMIN) tablet Take 1 tablet by mouth daily.    .  rosuvastatin (CRESTOR) 5 MG tablet Take 1 tablet by mouth daily.    Alveda Reasons 20 MG TABS tablet Take 20 mg by mouth daily.     No current facility-administered medications for this visit.     Allergies:   Sulfa antibiotics    Social History:  The patient  reports that she has never smoked. She has never used smokeless tobacco. She reports that she does not drink alcohol or use drugs.   Family History:  The patient's family history includes Cancer in her mother; Hypertension in her father and mother.    ROS:  Please see the history of present illness.   Otherwise, review of systems are positive for none.   All other systems are reviewed and negative.    PHYSICAL EXAM: VS:  BP (!) 160/80   Pulse 70   Ht 5' 3.5" (1.613 m)   Wt 146 lb (66.2 kg)   BMI 25.46 kg/m  , BMI Body mass index is 25.46 kg/m. GENERAL:  Well appearing HEENT:  Pupils equal round and reactive, fundi not visualized, oral mucosa unremarkable NECK:  No jugular venous distention, waveform within normal limits, carotid upstroke brisk and symmetric, no bruits, no thyromegaly LYMPHATICS:  No cervical, inguinal adenopathy LUNGS:  Clear to auscultation bilaterally BACK:  No CVA tenderness CHEST:  Unremarkable HEART:  PMI not displaced or sustained,S1 and S2 within normal limits, no S3, no clicks, no rubs, no murmurs, irregular ABD:  Flat, positive bowel sounds normal in frequency in pitch, no bruits, no rebound, no guarding, no midline pulsatile mass, no hepatomegaly, no splenomegaly EXT:  2 plus pulses throughout, no edema, no cyanosis no clubbing SKIN:  No rashes no nodules NEURO:  Cranial nerves II through XII grossly intact, motor grossly intact throughout PSYCH:  Cognitively intact, oriented to person place and time    EKG:  EKG is ordered today. The ekg ordered today demonstrates atrial fib with RVR, no acute ST T wave changes.  05/09/2019   Recent Labs: No results found for requested labs within last 8760  hours.    Lipid Panel No results found for: CHOL, TRIG, HDL, CHOLHDL, VLDL, LDLCALC, LDLDIRECT    Wt Readings from Last 3 Encounters:  05/09/19 146 lb (66.2 kg)  04/27/19 144 lb 9.6 oz (65.6 kg)      Other studies Reviewed: Additional studies/ records that were reviewed today include:   None. Review of the above records demonstrates:  Please see elsewhere in the note.     ASSESSMENT AND PLAN:  PAF:  Ms. Alvina FilbertBrenda Digman has a CHA2DS2 - VASc score of 3.  She tolerates this rhythm.  She tolerates anticoagulation.  No change in therapy.  We talked about an ablation which was discussed with her in the past and she prefer not to and I do not think it is indicated.  We talked about cardioversion and again I do not think this is indicated.  HTN: The blood pressure is elevated.  She is only taking her captopril once a day.  She does not want to take a lot of medications which she does agree to at least take this twice a day and then she can keep an eye on her blood pressure.  DYSLIPIDEMIA:   LDL was 40 in May.  No change in therapy.   Current medicines are reviewed at length with the patient today.  The patient does not have concerns regarding medicines.  The following changes have been made:  no change  Labs/ tests ordered today include: None No orders of the defined types were placed in this encounter.    Disposition:   FU with me in 12 months    Signed, Rollene RotundaJames Kamalei Roeder, MD  05/09/2019 4:04 PM    [facility]

## 2019-05-09 ENCOUNTER — Other Ambulatory Visit: Payer: Self-pay

## 2019-05-09 ENCOUNTER — Encounter: Payer: Self-pay | Admitting: Cardiology

## 2019-05-09 ENCOUNTER — Ambulatory Visit (INDEPENDENT_AMBULATORY_CARE_PROVIDER_SITE_OTHER): Payer: Medicare Other | Admitting: Cardiology

## 2019-05-09 VITALS — BP 160/80 | HR 70 | Ht 63.5 in | Wt 146.0 lb

## 2019-05-09 DIAGNOSIS — I482 Chronic atrial fibrillation, unspecified: Secondary | ICD-10-CM

## 2019-05-09 DIAGNOSIS — I10 Essential (primary) hypertension: Secondary | ICD-10-CM

## 2019-05-09 MED ORDER — CAPTOPRIL 25 MG PO TABS: 25.0000 mg | ORAL_TABLET | Freq: Two times a day (BID) | ORAL | 11 refills | Status: DC

## 2019-05-09 NOTE — Patient Instructions (Signed)
Medication Instructions:  Please increase Captopril to 25 mg twice a day. Continue all other medications as listed.  If you need a refill on your cardiac medications before your next appointment, please call your pharmacy.   Follow-Up: Follow up in 1 year with Dr. Percival Spanish.  You will receive a letter in the mail 2 months before you are due.  Please call us when you receive this letter to schedule your follow up appointment.   Thank you for choosing [facility]!!

## 2019-07-11 ENCOUNTER — Other Ambulatory Visit: Payer: Self-pay | Admitting: *Deleted

## 2019-07-11 MED ORDER — AMLODIPINE BESYLATE 5 MG PO TABS: 5.0000 mg | ORAL_TABLET | Freq: Every day | ORAL | 3 refills | Status: DC

## 2019-07-27 ENCOUNTER — Other Ambulatory Visit: Payer: Self-pay | Admitting: Family Medicine

## 2019-07-27 MED ORDER — LEVOTHYROXINE SODIUM 50 MCG PO TABS: 50.0000 ug | ORAL_TABLET | Freq: Every day | ORAL | 1 refills | Status: DC

## 2019-07-27 NOTE — Telephone Encounter (Signed)
She had labs last year with Dr. Edrick Oh, new patient to [place], okay to go ahead and refill her thyroid, she will need labs at the next visit

## 2019-07-30 NOTE — Telephone Encounter (Signed)
No answer at phone number listed. Script is ready.

## 2019-08-01 ENCOUNTER — Telehealth: Payer: Self-pay | Admitting: Family Medicine

## 2019-08-01 MED ORDER — SYNTHROID 50 MCG PO TABS: 50.0000 ug | ORAL_TABLET | Freq: Every day | ORAL | 1 refills | Status: DC

## 2019-08-01 NOTE — Telephone Encounter (Signed)
Resent to pharmacy for brand name

## 2019-08-22 ENCOUNTER — Other Ambulatory Visit: Payer: Self-pay

## 2019-08-22 ENCOUNTER — Other Ambulatory Visit: Payer: Medicare Other

## 2019-08-22 DIAGNOSIS — E039 Hypothyroidism, unspecified: Secondary | ICD-10-CM

## 2019-08-22 DIAGNOSIS — E782 Mixed hyperlipidemia: Secondary | ICD-10-CM

## 2019-08-22 DIAGNOSIS — I10 Essential (primary) hypertension: Secondary | ICD-10-CM

## 2019-08-23 LAB — CMP14+EGFR
ALT: 14 IU/L (ref 0–32)
AST: 22 IU/L (ref 0–40)
Albumin/Globulin Ratio: 1.6 (ref 1.2–2.2)
Albumin: 4.9 g/dL — ABNORMAL HIGH (ref 3.7–4.7)
Alkaline Phosphatase: 86 IU/L (ref 39–117)
BUN/Creatinine Ratio: 13 (ref 12–28)
BUN: 9 mg/dL (ref 8–27)
Bilirubin Total: 0.4 mg/dL (ref 0.0–1.2)
CO2: 22 mmol/L (ref 20–29)
Calcium: 10 mg/dL (ref 8.7–10.3)
Chloride: 99 mmol/L (ref 96–106)
Creatinine, Ser: 0.67 mg/dL (ref 0.57–1.00)
GFR calc Af Amer: 100 mL/min/{1.73_m2} (ref >59)
GFR calc non Af Amer: 87 mL/min/{1.73_m2} (ref >59)
Globulin, Total: 3 g/dL (ref 1.5–4.5)
Glucose: 123 mg/dL — ABNORMAL HIGH (ref 65–99)
Potassium: 4.6 mmol/L (ref 3.5–5.2)
Sodium: 136 mmol/L (ref 134–144)
Total Protein: 7.9 g/dL (ref 6.0–8.5)

## 2019-08-23 LAB — LIPID PANEL
Chol/HDL Ratio: 3 ratio (ref 0.0–4.4)
Cholesterol, Total: 155 mg/dL (ref 100–199)
HDL: 52 mg/dL (ref >39)
LDL Chol Calc (NIH): 83 mg/dL (ref 0–99)
Triglycerides: 108 mg/dL (ref 0–149)
VLDL Cholesterol Cal: 20 mg/dL (ref 5–40)

## 2019-08-23 LAB — CBC WITH DIFFERENTIAL/PLATELET
Basophils Absolute: 0 10*3/uL (ref 0.0–0.2)
Basos: 1 %
EOS (ABSOLUTE): 0.3 10*3/uL (ref 0.0–0.4)
Eos: 6 %
Hematocrit: 40.1 % (ref 34.0–46.6)
Hemoglobin: 13.8 g/dL (ref 11.1–15.9)
Immature Grans (Abs): 0 10*3/uL (ref 0.0–0.1)
Immature Granulocytes: 0 %
Lymphocytes Absolute: 1.7 10*3/uL (ref 0.7–3.1)
Lymphs: 37 %
MCH: 30.9 pg (ref 26.6–33.0)
MCHC: 34.4 g/dL (ref 31.5–35.7)
MCV: 90 fL (ref 79–97)
Monocytes Absolute: 0.6 10*3/uL (ref 0.1–0.9)
Monocytes: 13 %
Neutrophils Absolute: 2 10*3/uL (ref 1.4–7.0)
Neutrophils: 43 %
Platelets: 240 10*3/uL (ref 150–450)
RBC: 4.46 x10E6/uL (ref 3.77–5.28)
RDW: 12.8 % (ref 11.7–15.4)
WBC: 4.6 10*3/uL (ref 3.4–10.8)

## 2019-08-23 LAB — TSH: TSH: 1.76 u[IU]/mL (ref 0.450–4.500)

## 2019-08-28 ENCOUNTER — Other Ambulatory Visit: Payer: Self-pay

## 2019-08-29 ENCOUNTER — Encounter: Payer: Self-pay | Admitting: Family Medicine

## 2019-08-29 ENCOUNTER — Ambulatory Visit (INDEPENDENT_AMBULATORY_CARE_PROVIDER_SITE_OTHER): Payer: Medicare Other | Admitting: Family Medicine

## 2019-08-29 VITALS — BP 134/77 | HR 62 | Temp 93.7°F | Ht 63.5 in | Wt 145.2 lb

## 2019-08-29 DIAGNOSIS — E782 Mixed hyperlipidemia: Secondary | ICD-10-CM | POA: Diagnosis not present

## 2019-08-29 DIAGNOSIS — E039 Hypothyroidism, unspecified: Secondary | ICD-10-CM | POA: Diagnosis not present

## 2019-08-29 DIAGNOSIS — I480 Paroxysmal atrial fibrillation: Secondary | ICD-10-CM | POA: Diagnosis not present

## 2019-08-29 DIAGNOSIS — I10 Essential (primary) hypertension: Secondary | ICD-10-CM

## 2019-08-29 DIAGNOSIS — R7301 Impaired fasting glucose: Secondary | ICD-10-CM

## 2019-08-29 DIAGNOSIS — Z13820 Encounter for screening for osteoporosis: Secondary | ICD-10-CM

## 2019-08-29 NOTE — Progress Notes (Addendum)
BP (!) 163/82   Pulse 62   Temp (!) 93.7 F (34.3 C) (Temporal)   Ht 5' 3.5" (1.613 m)   Wt 145 lb 3.2 oz (65.9 kg)   SpO2 99%   BMI 25.32 kg/m    Subjective:   Patient ID: April Duke, female    DOB: [birth year], 73 y.o.   MRN: [id number]  HPI: April Duke is a 73 y.o. female presenting on 08/29/2019 for Atrial Fibrillation (4 month follow up) and Depression   HPI Hypothyroidism recheck Patient is coming in for thyroid recheck today as well. They deny any issues with hair changes or heat or cold problems or diarrhea or constipation. They deny any chest pain or palpitations. They are currently on levothyroxine 50 micrograms   Hyperlipidemia Patient is coming in for recheck of his hyperlipidemia. The patient is currently taking Crestor. They deny any issues with myalgias or history of liver damage from it. They deny any focal numbness or weakness or chest pain.   Hypertension Patient is currently on atenolol and amlodipine and captopril, and their blood pressure today is 163/82. Patient denies any lightheadedness or dizziness. Patient denies headaches, blurred vision, chest pains, shortness of breath, or weakness. Denies any side effects from medication and is content with current medication.   Patient has known A. fib and takes Xarelto and denies any major bruising or bleeding or palpitations or flutters or chest pain or shortness of breath.  Relevant past medical, surgical, family and social history reviewed and updated as indicated. Interim medical history since our last visit reviewed. Allergies and medications reviewed and updated.  Review of Systems  Constitutional: Negative for chills and fever.  HENT: Negative for congestion, ear discharge and ear pain.   Eyes: Negative for redness and visual disturbance.  Respiratory: Negative for chest tightness and shortness of breath.   Cardiovascular: Negative for chest pain and leg swelling.  Musculoskeletal: Negative for back pain  and gait problem.  Skin: Negative for rash.  Neurological: Negative for dizziness, light-headedness and headaches.  Psychiatric/Behavioral: Negative for agitation and behavioral problems.  All other systems reviewed and are negative.   Per HPI unless specifically indicated above   Allergies as of 08/29/2019      Reactions   Sulfa Antibiotics Rash      Medication List       Accurate as of August 29, 2019  8:36 AM. If you have any questions, ask your nurse or doctor.        amLODipine 5 MG tablet Commonly known as: NORVASC Take 1 tablet (5 mg total) by mouth daily.   atenolol 50 MG tablet Commonly known as: TENORMIN Take 1 tablet by mouth daily.   calcium carbonate 1500 (600 Ca) MG Tabs tablet Commonly known as: OSCAL Take by mouth 2 (two) times daily with a meal.   captopril 25 MG tablet Commonly known as: CAPOTEN Take 1 tablet (25 mg total) by mouth 2 (two) times daily.   multivitamin tablet Take 1 tablet by mouth daily.   rosuvastatin 5 MG tablet Commonly known as: CRESTOR Take 1 tablet by mouth daily.   Synthroid 50 MCG tablet Generic drug: levothyroxine Take 1 tablet (50 mcg total) by mouth daily.   Xarelto 20 MG Tabs tablet Generic drug: rivaroxaban Take 20 mg by mouth daily.        Objective:   BP (!) 163/82   Pulse 62   Temp (!) 93.7 F (34.3 C) (Temporal)   Ht 5' 3.5" (1.613  m)   Wt 145 lb 3.2 oz (65.9 kg)   SpO2 99%   BMI 25.32 kg/m   Wt Readings from Last 3 Encounters:  08/29/19 145 lb 3.2 oz (65.9 kg)  05/09/19 146 lb (66.2 kg)  04/27/19 144 lb 9.6 oz (65.6 kg)    Physical Exam Vitals signs and nursing note reviewed.  Constitutional:      General: She is not in acute distress.    Appearance: She is well-developed. She is not diaphoretic.  Eyes:     Conjunctiva/sclera: Conjunctivae normal.  Cardiovascular:     Rate and Rhythm: Normal rate. Rhythm irregular.     Heart sounds: Normal heart sounds. No murmur.  Pulmonary:      Effort: Pulmonary effort is normal. No respiratory distress.     Breath sounds: Normal breath sounds. No wheezing.  Musculoskeletal: Normal range of motion.        General: No tenderness.  Skin:    General: Skin is warm and dry.     Findings: No rash.  Neurological:     Mental Status: She is alert and oriented to person, place, and time.     Coordination: Coordination normal.  Psychiatric:        Behavior: Behavior normal.     Results for orders placed or performed in visit on 08/22/19  CBC with Differential/Platelet  Result Value Ref Range   WBC 4.6 3.4 - 10.8 x10E3/uL   RBC 4.46 3.77 - 5.28 x10E6/uL   Hemoglobin 13.8 11.1 - 15.9 g/dL   Hematocrit 40.1 34.0 - 46.6 %   MCV 90 79 - 97 fL   MCH 30.9 26.6 - 33.0 pg   MCHC 34.4 31.5 - 35.7 g/dL   RDW 12.8 11.7 - 15.4 %   Platelets 240 150 - 450 x10E3/uL   Neutrophils 43 Not Estab. %   Lymphs 37 Not Estab. %   Monocytes 13 Not Estab. %   Eos 6 Not Estab. %   Basos 1 Not Estab. %   Neutrophils Absolute 2.0 1.4 - 7.0 x10E3/uL   Lymphocytes Absolute 1.7 0.7 - 3.1 x10E3/uL   Monocytes Absolute 0.6 0.1 - 0.9 x10E3/uL   EOS (ABSOLUTE) 0.3 0.0 - 0.4 x10E3/uL   Basophils Absolute 0.0 0.0 - 0.2 x10E3/uL   Immature Granulocytes 0 Not Estab. %   Immature Grans (Abs) 0.0 0.0 - 0.1 x10E3/uL  CMP14+EGFR  Result Value Ref Range   Glucose 123 (H) 65 - 99 mg/dL   BUN 9 8 - 27 mg/dL   Creatinine, Ser 0.67 0.57 - 1.00 mg/dL   GFR calc non Af Amer 87 >59 mL/min/1.73   GFR calc Af Amer 100 >59 mL/min/1.73   BUN/Creatinine Ratio 13 12 - 28   Sodium 136 134 - 144 mmol/L   Potassium 4.6 3.5 - 5.2 mmol/L   Chloride 99 96 - 106 mmol/L   CO2 22 20 - 29 mmol/L   Calcium 10.0 8.7 - 10.3 mg/dL   Total Protein 7.9 6.0 - 8.5 g/dL   Albumin 4.9 (H) 3.7 - 4.7 g/dL   Globulin, Total 3.0 1.5 - 4.5 g/dL   Albumin/Globulin Ratio 1.6 1.2 - 2.2   Bilirubin Total 0.4 0.0 - 1.2 mg/dL   Alkaline Phosphatase 86 39 - 117 IU/L   AST 22 0 - 40 IU/L   ALT 14 0  - 32 IU/L  Lipid panel  Result Value Ref Range   Cholesterol, Total 155 100 - 199 mg/dL   Triglycerides 108 0 -  149 mg/dL   HDL 52 >39 mg/dL   VLDL Cholesterol Cal 20 5 - 40 mg/dL   LDL Chol Calc (NIH) 83 0 - 99 mg/dL   Chol/HDL Ratio 3.0 0.0 - 4.4 ratio  TSH  Result Value Ref Range   TSH 1.760 0.450 - 4.500 uIU/mL    Assessment & Plan:   Problem List Items Addressed This Visit      Cardiovascular and Mediastinum   A-fib ([place]) - Primary   HTN (hypertension)     Endocrine   Hypothyroid     Other   Hyperlipemia    Other Visit Diagnoses    Elevated fasting glucose       Relevant Orders   Bayer DCA Hb A1c Waived   Osteoporosis screening       Relevant Orders   DG Bone Density    Continue current medication including amlodipine and atenolol and captopril and Crestor and Synthroid and Xarelto.  Patient's blood pressure is up slightly today, she says she runs in the 120s and 130s at home, will have her check it over the next couple weeks and send [place] some numbers.  Likely elevated because of mask and situation visit in the doctor's office during coronavirus.  Follow up plan: Return in about 4 months (around 12/27/2019), or if symptoms worsen or fail to improve, for Thyroid and A. fib and hypertension.  Counseling provided for all of the vaccine components Orders Placed This Encounter  Procedures  . DG Bone Density  . Bayer [facility] Hb A1c Waived    Caryl Pina, MD [facility] 08/29/2019, 8:36 AM

## 2019-08-30 LAB — SPECIMEN STATUS REPORT

## 2019-09-05 ENCOUNTER — Telehealth: Payer: Self-pay | Admitting: Family Medicine

## 2019-09-05 NOTE — Telephone Encounter (Signed)
Patient aware that las A1c I can see was 2018. Patient will come by Friday and have one drawn with order that is in from 11/04

## 2019-09-07 ENCOUNTER — Other Ambulatory Visit: Payer: Medicare Other

## 2019-09-07 ENCOUNTER — Other Ambulatory Visit: Payer: Self-pay

## 2019-09-07 DIAGNOSIS — R7301 Impaired fasting glucose: Secondary | ICD-10-CM

## 2019-09-07 LAB — BAYER DCA HB A1C WAIVED: HB A1C (BAYER DCA - WAIVED): 6 % (ref <7.0)

## 2019-09-11 ENCOUNTER — Encounter: Payer: Self-pay | Admitting: Family Medicine

## 2019-11-16 ENCOUNTER — Other Ambulatory Visit: Payer: Self-pay | Admitting: *Deleted

## 2019-11-16 MED ORDER — ATENOLOL 50 MG PO TABS: 50.0000 mg | ORAL_TABLET | Freq: Every day | ORAL | 0 refills | Status: DC

## 2019-11-19 ENCOUNTER — Telehealth: Payer: Self-pay | Admitting: Family Medicine

## 2019-11-20 NOTE — Telephone Encounter (Signed)
Attempted to contact patient - NA °

## 2019-11-20 NOTE — Telephone Encounter (Signed)
I do not fully understand what this question is asking, was the February 2 appointment to early because it had not been 2 years since her insurance initially scheduled for November 30?  I agree with getting her DEXA but insurance does typically make you wait 2 years, I do not know if that is the issue, please let me know, I do not fully understand the question Arville Care, MD [facility] 11/20/2019, 9:29 AM

## 2019-12-17 ENCOUNTER — Ambulatory Visit: Payer: Medicare Other

## 2019-12-21 ENCOUNTER — Other Ambulatory Visit: Payer: Medicare Other

## 2019-12-21 ENCOUNTER — Other Ambulatory Visit: Payer: Self-pay

## 2019-12-21 DIAGNOSIS — E782 Mixed hyperlipidemia: Secondary | ICD-10-CM

## 2019-12-21 DIAGNOSIS — E039 Hypothyroidism, unspecified: Secondary | ICD-10-CM

## 2019-12-21 DIAGNOSIS — I10 Essential (primary) hypertension: Secondary | ICD-10-CM

## 2019-12-21 LAB — BAYER DCA HB A1C WAIVED: HB A1C (BAYER DCA - WAIVED): 6.1 % (ref <7.0)

## 2019-12-22 LAB — CBC WITH DIFFERENTIAL/PLATELET
Basophils Absolute: 0 10*3/uL (ref 0.0–0.2)
Basos: 1 %
EOS (ABSOLUTE): 0.2 10*3/uL (ref 0.0–0.4)
Eos: 5 %
Hematocrit: 37.5 % (ref 34.0–46.6)
Hemoglobin: 12.6 g/dL (ref 11.1–15.9)
Immature Grans (Abs): 0 10*3/uL (ref 0.0–0.1)
Immature Granulocytes: 1 %
Lymphocytes Absolute: 1.4 10*3/uL (ref 0.7–3.1)
Lymphs: 35 %
MCH: 30.6 pg (ref 26.6–33.0)
MCHC: 33.6 g/dL (ref 31.5–35.7)
MCV: 91 fL (ref 79–97)
Monocytes Absolute: 0.5 10*3/uL (ref 0.1–0.9)
Monocytes: 14 %
Neutrophils Absolute: 1.8 10*3/uL (ref 1.4–7.0)
Neutrophils: 44 %
Platelets: 220 10*3/uL (ref 150–450)
RBC: 4.12 x10E6/uL (ref 3.77–5.28)
RDW: 12.7 % (ref 11.7–15.4)
WBC: 3.9 10*3/uL (ref 3.4–10.8)

## 2019-12-22 LAB — LIPID PANEL
Chol/HDL Ratio: 2.7 ratio (ref 0.0–4.4)
Cholesterol, Total: 137 mg/dL (ref 100–199)
HDL: 51 mg/dL (ref >39)
LDL Chol Calc (NIH): 70 mg/dL (ref 0–99)
Triglycerides: 86 mg/dL (ref 0–149)
VLDL Cholesterol Cal: 16 mg/dL (ref 5–40)

## 2019-12-22 LAB — TSH: TSH: 2.1 u[IU]/mL (ref 0.450–4.500)

## 2019-12-22 LAB — CMP14+EGFR
ALT: 9 IU/L (ref 0–32)
AST: 21 IU/L (ref 0–40)
Albumin/Globulin Ratio: 1.5 (ref 1.2–2.2)
Albumin: 4.6 g/dL (ref 3.7–4.7)
Alkaline Phosphatase: 80 IU/L (ref 39–117)
BUN/Creatinine Ratio: 14 (ref 12–28)
BUN: 10 mg/dL (ref 8–27)
Bilirubin Total: 0.4 mg/dL (ref 0.0–1.2)
CO2: 22 mmol/L (ref 20–29)
Calcium: 9.7 mg/dL (ref 8.7–10.3)
Chloride: 97 mmol/L (ref 96–106)
Creatinine, Ser: 0.72 mg/dL (ref 0.57–1.00)
GFR calc Af Amer: 95 mL/min/{1.73_m2} (ref >59)
GFR calc non Af Amer: 83 mL/min/{1.73_m2} (ref >59)
Globulin, Total: 3 g/dL (ref 1.5–4.5)
Glucose: 112 mg/dL — ABNORMAL HIGH (ref 65–99)
Potassium: 4.3 mmol/L (ref 3.5–5.2)
Sodium: 135 mmol/L (ref 134–144)
Total Protein: 7.6 g/dL (ref 6.0–8.5)

## 2019-12-27 ENCOUNTER — Other Ambulatory Visit: Payer: Self-pay

## 2019-12-27 ENCOUNTER — Ambulatory Visit (INDEPENDENT_AMBULATORY_CARE_PROVIDER_SITE_OTHER): Payer: Medicare Other | Admitting: Family Medicine

## 2019-12-27 ENCOUNTER — Encounter: Payer: Self-pay | Admitting: Family Medicine

## 2019-12-27 ENCOUNTER — Ambulatory Visit (INDEPENDENT_AMBULATORY_CARE_PROVIDER_SITE_OTHER): Payer: Medicare Other

## 2019-12-27 VITALS — BP 139/74 | HR 72 | Temp 98.7°F | Ht 64.0 in | Wt 144.2 lb

## 2019-12-27 DIAGNOSIS — K219 Gastro-esophageal reflux disease without esophagitis: Secondary | ICD-10-CM | POA: Diagnosis not present

## 2019-12-27 DIAGNOSIS — I480 Paroxysmal atrial fibrillation: Secondary | ICD-10-CM

## 2019-12-27 DIAGNOSIS — Z780 Asymptomatic menopausal state: Secondary | ICD-10-CM | POA: Diagnosis not present

## 2019-12-27 DIAGNOSIS — E782 Mixed hyperlipidemia: Secondary | ICD-10-CM

## 2019-12-27 DIAGNOSIS — E039 Hypothyroidism, unspecified: Secondary | ICD-10-CM

## 2019-12-27 DIAGNOSIS — Z8781 Personal history of (healed) traumatic fracture: Secondary | ICD-10-CM

## 2019-12-27 DIAGNOSIS — I10 Essential (primary) hypertension: Secondary | ICD-10-CM | POA: Diagnosis not present

## 2019-12-27 DIAGNOSIS — Z13820 Encounter for screening for osteoporosis: Secondary | ICD-10-CM

## 2019-12-27 NOTE — Progress Notes (Signed)
BP 139/74   Pulse 72   Temp 98.7 F (37.1 C)   Ht '5\' 4"'  (1.626 m)   Wt 144 lb 4 oz (65.4 kg)   SpO2 100%   BMI 24.76 kg/m    Subjective:   Patient ID: April Duke, female    DOB: 1946/07/13, 74 y.o.   MRN: [id number]  HPI: April Duke is a 74 y.o. female presenting on 12/27/2019 for Medical Management of Chronic Issues, Atrial Fibrillation, and Dexa (folllowing appt today- pt states she was told she had right hip fracture. C/O pain today.)   HPI Right hip fracture Patient had a right hip fracture and needs a bone density scan follow-up for this.  We will do this today.  She had the hip fracture due to medicine that she was taking for her bone density  Patient has chronic A. fib and does see cardiology once a year for this.  Her hemoglobin looks stable.  She denies any bruising or bleeding.  She denies any chest pain or palpitations.  Hypothyroidism recheck Patient is coming in for thyroid recheck today as well. They deny any issues with hair changes or heat or cold problems or diarrhea or constipation. They deny any chest pain or palpitations. They are currently on levothyroxine 50 micrograms   Hyperlipidemia Patient is coming in for recheck of his hyperlipidemia. The patient is currently taking Crestor. They deny any issues with myalgias or history of liver damage from it. They deny any focal numbness or weakness or chest pain.   GERD Patient is currently on no medication currently and has been doing well without.  She denies any major symptoms or abdominal pain or belching or burping. She denies any blood in her stool or lightheadedness or dizziness.   Hypertension Patient is currently on amlodipine and atenolol and captopril, and their blood pressure today is 139/74. Patient denies any lightheadedness or dizziness. Patient denies headaches, blurred vision, chest pains, shortness of breath, or weakness. Denies any side effects from medication and is content with current medication.    Relevant past medical, surgical, family and social history reviewed and updated as indicated. Interim medical history since our last visit reviewed. Allergies and medications reviewed and updated.  Review of Systems  Constitutional: Negative for chills and fever.  Eyes: Negative for visual disturbance.  Respiratory: Negative for chest tightness and shortness of breath.   Cardiovascular: Negative for chest pain and leg swelling.  Genitourinary: Negative for difficulty urinating and dysuria.  Musculoskeletal: Negative for back pain and gait problem.  Skin: Negative for rash.  Neurological: Negative for dizziness, light-headedness and headaches.  Psychiatric/Behavioral: Negative for agitation and behavioral problems.  All other systems reviewed and are negative.   Per HPI unless specifically indicated above   Allergies as of 12/27/2019      Reactions   Sulfa Antibiotics Rash      Medication List       Accurate as of December 27, 2019  9:18 AM. If you have any questions, ask your nurse or doctor.        amLODipine 5 MG tablet Commonly known as: NORVASC Take 1 tablet (5 mg total) by mouth daily.   atenolol 50 MG tablet Commonly known as: TENORMIN Take 1 tablet (50 mg total) by mouth daily.   calcium carbonate 1500 (600 Ca) MG Tabs tablet Commonly known as: OSCAL Take by mouth 2 (two) times daily with a meal.   captopril 25 MG tablet Commonly known as: CAPOTEN Take 1  tablet (25 mg total) by mouth 2 (two) times daily.   multivitamin tablet Take 1 tablet by mouth daily.   rosuvastatin 5 MG tablet Commonly known as: CRESTOR Take 1 tablet by mouth daily.   Synthroid 50 MCG tablet Generic drug: levothyroxine Take 1 tablet (50 mcg total) by mouth daily.   Xarelto 20 MG Tabs tablet Generic drug: rivaroxaban Take 20 mg by mouth daily.        Objective:   BP 139/74   Pulse 72   Temp 98.7 F (37.1 C)   Ht '5\' 4"'  (1.626 m)   Wt 144 lb 4 oz (65.4 kg)   SpO2 100%    BMI 24.76 kg/m   Wt Readings from Last 3 Encounters:  12/27/19 144 lb 4 oz (65.4 kg)  08/29/19 145 lb 3.2 oz (65.9 kg)  05/09/19 146 lb (66.2 kg)    Physical Exam Vitals and nursing note reviewed.  Constitutional:      General: She is not in acute distress.    Appearance: She is well-developed. She is not diaphoretic.  Eyes:     Conjunctiva/sclera: Conjunctivae normal.  Cardiovascular:     Rate and Rhythm: Normal rate and regular rhythm.     Heart sounds: Normal heart sounds. No murmur.  Pulmonary:     Effort: Pulmonary effort is normal. No respiratory distress.     Breath sounds: Normal breath sounds. No wheezing.  Musculoskeletal:        General: No tenderness. Normal range of motion.  Skin:    General: Skin is warm and dry.     Findings: No rash.  Neurological:     Mental Status: She is alert and oriented to person, place, and time.     Coordination: Coordination normal.  Psychiatric:        Behavior: Behavior normal.     Results for orders placed or performed in visit on 12/21/19  Lipid panel  Result Value Ref Range   Cholesterol, Total 137 100 - 199 mg/dL   Triglycerides 86 0 - 149 mg/dL   HDL 51 >39 mg/dL   VLDL Cholesterol Cal 16 5 - 40 mg/dL   LDL Chol Calc (NIH) 70 0 - 99 mg/dL   Chol/HDL Ratio 2.7 0.0 - 4.4 ratio  CBC with Differential/Platelet  Result Value Ref Range   WBC 3.9 3.4 - 10.8 x10E3/uL   RBC 4.12 3.77 - 5.28 x10E6/uL   Hemoglobin 12.6 11.1 - 15.9 g/dL   Hematocrit 37.5 34.0 - 46.6 %   MCV 91 79 - 97 fL   MCH 30.6 26.6 - 33.0 pg   MCHC 33.6 31.5 - 35.7 g/dL   RDW 12.7 11.7 - 15.4 %   Platelets 220 150 - 450 x10E3/uL   Neutrophils 44 Not Estab. %   Lymphs 35 Not Estab. %   Monocytes 14 Not Estab. %   Eos 5 Not Estab. %   Basos 1 Not Estab. %   Neutrophils Absolute 1.8 1.4 - 7.0 x10E3/uL   Lymphocytes Absolute 1.4 0.7 - 3.1 x10E3/uL   Monocytes Absolute 0.5 0.1 - 0.9 x10E3/uL   EOS (ABSOLUTE) 0.2 0.0 - 0.4 x10E3/uL   Basophils  Absolute 0.0 0.0 - 0.2 x10E3/uL   Immature Granulocytes 1 Not Estab. %   Immature Grans (Abs) 0.0 0.0 - 0.1 x10E3/uL  CMP14+EGFR  Result Value Ref Range   Glucose 112 (H) 65 - 99 mg/dL   BUN 10 8 - 27 mg/dL   Creatinine, Ser 0.72 0.57 -  1.00 mg/dL   GFR calc non Af Amer 83 >59 mL/min/1.73   GFR calc Af Amer 95 >59 mL/min/1.73   BUN/Creatinine Ratio 14 12 - 28   Sodium 135 134 - 144 mmol/L   Potassium 4.3 3.5 - 5.2 mmol/L   Chloride 97 96 - 106 mmol/L   CO2 22 20 - 29 mmol/L   Calcium 9.7 8.7 - 10.3 mg/dL   Total Protein 7.6 6.0 - 8.5 g/dL   Albumin 4.6 3.7 - 4.7 g/dL   Globulin, Total 3.0 1.5 - 4.5 g/dL   Albumin/Globulin Ratio 1.5 1.2 - 2.2   Bilirubin Total 0.4 0.0 - 1.2 mg/dL   Alkaline Phosphatase 80 39 - 117 IU/L   AST 21 0 - 40 IU/L   ALT 9 0 - 32 IU/L  Bayer DCA Hb A1c Waived  Result Value Ref Range   HB A1C (BAYER DCA - WAIVED) 6.1 <7.0 %  TSH  Result Value Ref Range   TSH 2.100 0.450 - 4.500 uIU/mL    Assessment & Plan:   Problem List Items Addressed This Visit      Cardiovascular and Mediastinum   A-fib (HCC)   HTN (hypertension) - Primary   Relevant Orders   CBC with Differential/Platelet     Digestive   GERD (gastroesophageal reflux disease)   Relevant Orders   CBC with Differential/Platelet     Endocrine   Hypothyroid   Relevant Orders   TSH     Other   Hyperlipemia   Relevant Orders   Lipid panel    Other Visit Diagnoses    Status post closed fracture of right hip       Relevant Orders   DG [facility] DEXA      Continue current medication, everything looks good, will do a bone density scan and may do referral to specialist for osteoporosis management because of history of fracture to the medications. Follow up plan: Return in about 4 months (around 04/27/2020), or if symptoms worsen or fail to improve, for Thyroid and hypertension recheck.  Counseling provided for all of the vaccine components Orders Placed This Encounter  Procedures  . DG  [facility] DEXA  . CBC with Differential/Platelet  . Lipid panel  . TSH    Caryl Pina, MD [facility] 12/27/2019, 9:18 AM

## 2020-01-04 ENCOUNTER — Telehealth: Payer: Self-pay | Admitting: Family Medicine

## 2020-01-04 ENCOUNTER — Telehealth: Payer: Self-pay

## 2020-01-04 NOTE — Telephone Encounter (Signed)
Patient called and would like DEXA results and to know if there is any changes or additions that she needs to make at this time. Please review and advise.

## 2020-01-07 NOTE — Telephone Encounter (Signed)
No changes at this time.

## 2020-01-08 ENCOUNTER — Ambulatory Visit (INDEPENDENT_AMBULATORY_CARE_PROVIDER_SITE_OTHER): Payer: Medicare Other

## 2020-01-08 DIAGNOSIS — Z0000 Encounter for general adult medical examination without abnormal findings: Secondary | ICD-10-CM | POA: Diagnosis not present

## 2020-01-08 NOTE — Progress Notes (Addendum)
MEDICARE ANNUAL WELLNESS VISIT  01/08/2020  Telephone Visit Disclaimer This Medicare AWV was conducted by telephone due to national recommendations for restrictions regarding the COVID-19 Pandemic (e.g. social distancing).  I verified, using two identifiers, that I am speaking with April Duke or their authorized healthcare agent. I discussed the limitations, risks, security, and privacy concerns of performing an evaluation and management service by telephone and the potential availability of an in-person appointment in the future. The patient expressed understanding and agreed to proceed.   Subjective:  April Duke is a 74 y.o. female patient of Dettinger, Elige Radon, MD who had a Medicare Annual Wellness Visit today via telephone. April Duke is Retired and lives with their spouse. she has 3 children. she reports that she is socially active and does interact with friends/family regularly. she is minimally physically active and enjoys walking and cooking, staying busy.  Patient Care Team: Dettinger, Elige Radon, MD as PCP - General (Family Medicine)  No flowsheet data found.  Hospital Utilization Over the Past 12 Months: # of hospitalizations or ER visits: 0 # of surgeries: 0  Review of Systems    Patient reports that her overall health is unchanged compared to last year.  Patient Reported Readings (BP, Pulse, CBG, Weight, etc) none  Pain Assessment       Current Medications & Allergies (verified) Allergies as of 01/08/2020       Reactions   Sulfa Antibiotics Rash        Medication List        Accurate as of January 08, 2020  8:14 AM. If you have any questions, ask your nurse or doctor.          amLODipine 5 MG tablet Commonly known as: NORVASC Take 1 tablet (5 mg total) by mouth daily.   atenolol 50 MG tablet Commonly known as: TENORMIN Take 1 tablet (50 mg total) by mouth daily.   calcium carbonate 1500 (600 Ca) MG Tabs tablet Commonly known as: OSCAL Take by  mouth 2 (two) times daily with a meal.   captopril 25 MG tablet Commonly known as: CAPOTEN Take 1 tablet (25 mg total) by mouth 2 (two) times daily.   multivitamin tablet Take 1 tablet by mouth daily.   rosuvastatin 5 MG tablet Commonly known as: CRESTOR Take 1 tablet by mouth daily.   Synthroid 50 MCG tablet Generic drug: levothyroxine Take 1 tablet (50 mcg total) by mouth daily.   Xarelto 20 MG Tabs tablet Generic drug: rivaroxaban Take 20 mg by mouth daily.        History (reviewed): Past Medical History:  Diagnosis Date   Atrial fibrillation (HCC)    Hyperlipidemia    Hypertension    Osteoporosis    Thyroid disease    No past surgical history on file. Family History  Problem Relation Age of Onset   Cancer Mother        breast   Hypertension Mother    Hypertension Father    Social History   Socioeconomic History   Marital status: Married    Spouse name: Not on file   Number of children: 3   Years of education: Not on file   Highest education level: Not on file  Occupational History   Not on file  Tobacco Use   Smoking status: Never Smoker   Smokeless tobacco: Never Used  Substance and Sexual Activity   Alcohol use: Never   Drug use: Never   Sexual activity: Not on file  Comment: married 56 years in 2020  Other Topics Concern   Not on file  Social History Narrative   Lives with husband.  3 sons.     Social Determinants of Health   Financial Resource Strain:    Difficulty of Paying Living Expenses:   Food Insecurity:    Worried About Programme researcher, broadcasting/film/video in the Last Year:    Barista in the Last Year:   Transportation Needs:    Freight forwarder (Medical):    Lack of Transportation (Non-Medical):   Physical Activity:    Days of Exercise per Week:    Minutes of Exercise per Session:   Stress:    Feeling of Stress :   Social Connections:    Frequency of Communication with Friends and Family:    Frequency of Social  Gatherings with Friends and Family:    Attends Religious Services:    Active Member of Clubs or Organizations:    Attends Banker Meetings:    Marital Status:     Activities of Daily Living No flowsheet data found.  Patient Education/ Literacy  9th grade  Exercise  Walking 5 days per week  Diet Patient reports consuming 3 meals a day and 0 snack(s) a day Patient reports that her primary diet is: Low Sodium Patient reports that she does have regular access to food.   Depression Screen PHQ 2/9 Scores 12/27/2019 08/29/2019 04/27/2019  PHQ - 2 Score 0 0 0     Fall Risk Fall Risk  12/27/2019 08/29/2019 04/27/2019  Falls in the past year? 0 0 0     Objective:  April Duke seemed alert and oriented and she participated appropriately during our telephone visit.  Blood Pressure Weight BMI  BP Readings from Last 3 Encounters:  12/27/19 139/74  08/29/19 134/77  05/09/19 (!) 160/80   Wt Readings from Last 3 Encounters:  12/27/19 144 lb 4 oz (65.4 kg)  08/29/19 145 lb 3.2 oz (65.9 kg)  05/09/19 146 lb (66.2 kg)   BMI Readings from Last 1 Encounters:  12/27/19 24.76 kg/m    *Unable to obtain current vital signs, weight, and BMI due to telephone visit type  Hearing/Vision  April Duke did not seem to have difficulty with hearing/understanding during the telephone conversation Reports that she has had a formal eye exam by an eye care professional within the past year Reports that she has not had a formal hearing evaluation within the past year *Unable to fully assess hearing and vision during telephone visit type  Cognitive Function: No flowsheet data found. (Normal:0-7, Significant for Dysfunction: >8)  Normal Cognitive Function Screening: Yes   Immunization & Health Maintenance Record Immunization History  Administered Date(s) Administered   Tdap 04/16/2015    Health Maintenance  Topic Date Due   INFLUENZA VACCINE  01/23/2020 (Originally 05/26/2019)   COLONOSCOPY   04/26/2020 (Originally 01/27/1995)   PNA vac Low Risk Adult (1 of 2 - PCV13) 04/26/2020 (Originally 01/26/2010)   Hepatitis C Screening  08/28/2020 (Originally 10/01/1946)   MAMMOGRAM  04/26/2020   TETANUS/TDAP  04/26/2028   DEXA SCAN  Completed       Assessment  This is a routine wellness examination for [employer].  Health Maintenance: Due or Overdue There are no preventive care reminders to display for this patient.  April Duke does not need a referral for Community Assistance: Care Management:   no Social Work:    no Prescription Assistance:  no Nutrition/Diabetes Education:  no   Plan:  Personalized Goals  Fall Prevention  Personalized Health Maintenance & Screening Recommendations    Lung Cancer Screening Recommended: no (Low Dose CT Chest recommended if Age 25-80 years, 30 pack-year currently smoking OR have quit w/in past 15 years) Hepatitis C Screening recommended: no HIV Screening recommended: no  Advanced Directives: Written information was not prepared per patient's request.  Referrals & Orders No orders of the defined types were placed in this encounter.   Follow-up Plan Follow-up with Dettinger, Fransisca Kaufmann, MD as planned Schedule 04/30/2020    I have personally reviewed and noted the following in the patient's chart:   Medical and social history Use of alcohol, tobacco or illicit drugs  Current medications and supplements Functional ability and status Nutritional status Physical activity Advanced directives List of other physicians Hospitalizations, surgeries, and ER visits in previous 12 months Vitals Screenings to include cognitive, depression, and falls Referrals and appointments  In addition, I have reviewed and discussed with April Duke certain preventive protocols, quality metrics, and best practice recommendations. A written personalized care plan for preventive services as well as general preventive health recommendations is available and can  be mailed to the patient at her request.      Alphonzo Dublin, LPN 1/49/7026  I have reviewed and agree with the above AWV documentation.   Evelina Dun, FNP

## 2020-01-31 ENCOUNTER — Ambulatory Visit (INDEPENDENT_AMBULATORY_CARE_PROVIDER_SITE_OTHER): Payer: Medicare Other | Admitting: Nurse Practitioner

## 2020-01-31 ENCOUNTER — Encounter: Payer: Self-pay | Admitting: Nurse Practitioner

## 2020-01-31 DIAGNOSIS — N3000 Acute cystitis without hematuria: Secondary | ICD-10-CM | POA: Diagnosis not present

## 2020-01-31 MED ORDER — FLUCONAZOLE 150 MG PO TABS: 150.0000 mg | ORAL_TABLET | Freq: Once | ORAL | 0 refills | Status: AC

## 2020-01-31 MED ORDER — AMOXICILLIN-POT CLAVULANATE 875-125 MG PO TABS: 1.0000 | ORAL_TABLET | Freq: Two times a day (BID) | ORAL | 0 refills | Status: DC

## 2020-01-31 NOTE — Progress Notes (Signed)
   Virtual Visit via telephone Note Due to COVID-19 pandemic this visit was conducted virtually. This visit type was conducted due to national recommendations for restrictions regarding the COVID-19 Pandemic (e.g. social distancing, sheltering in place) in an effort to limit this patient's exposure and mitigate transmission in our community. All issues noted in this document were discussed and addressed.  A physical exam was not performed with this format.  I connected with April Duke on 01/31/20 at 11:25 by telephone and verified that I am speaking with the correct person using two identifiers. April Duke is currently located at home and  No one is currently with  her during visit. The provider, Mary-Margaret Daphine Deutscher, FNP is located in their office at time of visit.  I discussed the limitations, risks, security and privacy concerns of performing an evaluation and management service by telephone and the availability of in person appointments. I also discussed with the patient that there may be a patient responsible charge related to this service. The patient expressed understanding and agreed to proceed.   History and Present Illness:  Patient calls in for telephone visit c/o freq and urgency. She went to the ER 2 weeks ago with UTI. She was given antibiotics. She says her symptoms have returned. Now she has a fever and slight nausea. She says she has dysuria and vaginal itching.   Review of Systems  Constitutional: Positive for chills and fever.  Respiratory: Negative.   Cardiovascular: Negative.   Genitourinary: Positive for dysuria, frequency and urgency.       Vaginal itching  Neurological: Negative.   Psychiatric/Behavioral: Negative.   All other systems reviewed and are negative.    Observations/Objective: Alert and oriented- answers all questions appropriately No distress    Assessment and Plan: April Duke in today with chief complaint of No chief complaint on file.   1.  Acute cystitis without hematuria Take medication as prescribe Cotton underwear Take shower not bath Cranberry juice, yogurt Force fluids AZO over the counter X2 days RTO prn- will need  To be seen if not getting better  - amoxicillin-clavulanate (AUGMENTIN) 875-125 MG tablet; Take 1 tablet by mouth 2 (two) times daily.  Dispense: 14 tablet; Refill: 0 - fluconazole (DIFLUCAN) 150 MG tablet; Take 1 tablet (150 mg total) by mouth once for 1 dose.  Dispense: 1 tablet; Refill: 0    Follow Up Instructions: prn    I discussed the assessment and treatment plan with the patient. The patient was provided an opportunity to ask questions and all were answered. The patient agreed with the plan and demonstrated an understanding of the instructions.   The patient was advised to call back or seek an in-person evaluation if the symptoms worsen or if the condition fails to improve as anticipated.  The above assessment and management plan was discussed with the patient. The patient verbalized understanding of and has agreed to the management plan. Patient is aware to call the clinic if symptoms persist or worsen. Patient is aware when to return to the clinic for a follow-up visit. Patient educated on when it is appropriate to go to the emergency department.   Time call ended:  11:40  I provided 20 minutes of non-face-to-face time during this encounter.    Mary-Margaret Daphine Deutscher, FNP

## 2020-02-14 ENCOUNTER — Other Ambulatory Visit: Payer: Self-pay | Admitting: Family Medicine

## 2020-02-29 ENCOUNTER — Other Ambulatory Visit: Payer: Self-pay

## 2020-02-29 ENCOUNTER — Ambulatory Visit
Admission: EM | Admit: 2020-02-29 | Discharge: 2020-02-29 | Disposition: A | Discharge status: Home / Self Care | Payer: Medicare Other | Attending: Emergency Medicine | Admitting: Emergency Medicine

## 2020-02-29 DIAGNOSIS — L0390 Cellulitis, unspecified: Secondary | ICD-10-CM | POA: Diagnosis not present

## 2020-02-29 MED ORDER — CEPHALEXIN 500 MG PO CAPS: 500.0000 mg | ORAL_CAPSULE | Freq: Four times a day (QID) | ORAL | 0 refills | Status: DC

## 2020-02-29 NOTE — Discharge Instructions (Addendum)
Apply warm compresses 3-4x daily for 10-15 minutes Take antibiotic as prescribed and to completion Follow up here or with PCP if symptoms persists Return or go to the ED if you have any new or worsening symptoms increased redness, swelling, pain, nausea, vomiting, fever, chills, etc..Marland Kitchen

## 2020-02-29 NOTE — ED Provider Notes (Addendum)
.  [place]   [id number] 02/29/20 Arrival Time: 6295   [id number]  SUBJECTIVE:  April Duke is a 74 y.o. female who presents with a possible abscess and pain on her left elbow for the past few days.  Report redness and swelling.  Described the pain as achy and rated a 7 on a scale 1-10.  Denies any drainage.  Has not tried any OTC medication.  Denies similar symptoms in the past.  Denies chills, fever, nausea, vomiting, diarrhea  ROS: As per HPI.  All other pertinent ROS negative.     Past Medical History:  Diagnosis Date  . Atrial fibrillation ([place])   . Hyperlipidemia   . Hypertension   . Osteoporosis   . Thyroid disease    History reviewed. No pertinent surgical history. Allergies  Allergen Reactions  . Sulfa Antibiotics Rash   No current facility-administered medications on file prior to encounter.   Current Outpatient Medications on File Prior to Encounter  Medication Sig Dispense Refill  . amLODipine (NORVASC) 5 MG tablet Take 1 tablet (5 mg total) by mouth daily. 90 tablet 3  . amoxicillin-clavulanate (AUGMENTIN) 875-125 MG tablet Take 1 tablet by mouth 2 (two) times daily. 14 tablet 0  . atenolol (TENORMIN) 50 MG tablet Take 1 tablet by mouth once daily 90 tablet 0  . calcium carbonate (OSCAL) 1500 (600 Ca) MG TABS tablet Take by mouth 2 (two) times daily with a meal.    . captopril (CAPOTEN) 25 MG tablet Take 1 tablet (25 mg total) by mouth 2 (two) times daily. 60 tablet 11  . Multiple Vitamin (MULTIVITAMIN) tablet Take 1 tablet by mouth daily.    . rosuvastatin (CRESTOR) 5 MG tablet Take 1 tablet by mouth daily.    Marland Kitchen SYNTHROID 50 MCG tablet Take 1 tablet (50 mcg total) by mouth daily. 90 tablet 1  . XARELTO 20 MG TABS tablet Take 20 mg by mouth daily.     Social History   Socioeconomic History  . Marital status: Married    Spouse name: Not on file  . Number of children: 3  . Years of education: Not on file  . Highest education level: Not on file    Occupational History  . Not on file  Tobacco Use  . Smoking status: Never Smoker  . Smokeless tobacco: Never Used  Substance and Sexual Activity  . Alcohol use: Never  . Drug use: Never  . Sexual activity: Not on file    Comment: married 56 years in 2020  Other Topics Concern  . Not on file  Social History Narrative   Lives with husband.  3 sons.     Social Determinants of Health   Financial Resource Strain:   . Difficulty of Paying Living Expenses:   Food Insecurity:   . Worried About Charity fundraiser in the Last Year:   . Arboriculturist in the Last Year:   Transportation Needs:   . Film/video editor (Medical):   Marland Kitchen Lack of Transportation (Non-Medical):   Physical Activity:   . Days of Exercise per Week:   . Minutes of Exercise per Session:   Stress:   . Feeling of Stress :   Social Connections:   . Frequency of Communication with Friends and Family:   . Frequency of Social Gatherings with Friends and Family:   . Attends Religious Services:   . Active Member of Clubs or Organizations:   . Attends Archivist Meetings:   .  Marital Status:   Intimate Partner Violence:   . Fear of Current or Ex-Partner:   . Emotionally Abused:   Marland Kitchen Physically Abused:   . Sexually Abused:    Family History  Problem Relation Age of Onset  . Cancer Mother        breast  . Hypertension Mother   . Hypertension Father     OBJECTIVE:  Vitals:   02/29/20 1603  BP: (!) 164/81  Pulse: 72  Resp: 20  Temp: 98.1 F (36.7 C)  SpO2: 98%     Physical Exam Vitals and nursing note reviewed.  Constitutional:      General: She is not in acute distress.    Appearance: Normal appearance. She is normal weight. She is not ill-appearing, toxic-appearing or diaphoretic.  Cardiovascular:     Rate and Rhythm: Normal rate and regular rhythm.     Pulses: Normal pulses.     Heart sounds: Normal heart sounds. No murmur. No friction rub. No gallop.   Pulmonary:     Effort:  Pulmonary effort is normal. No respiratory distress.     Breath sounds: Normal breath sounds. No stridor. No wheezing, rhonchi or rales.  Chest:     Chest wall: No tenderness.  Skin:    Findings: Abscess and erythema present.     Comments: There is a hard knot around her left elbow.  Tender to touch with swelling  Neurological:     Mental Status: She is alert.     ASSESSMENT & PLAN:  1. Cellulitis of skin    Meds ordered this encounter  Medications  . cephALEXin (KEFLEX) 500 MG capsule    Sig: Take 1 capsule (500 mg total) by mouth 4 (four) times daily.    Dispense:  20 capsule    Refill:  0   There is a concern of abscess and cellulitis of skin.  Keflex will be prescribed.  She was advised to follow-up with PCP if symptoms persist.  Discharge instruction Apply warm compresses 3-4x daily for 10-15 minutes Take antibiotic as prescribed and to completion Follow up here or with PCP if symptoms persists Return or go to the ED if you have any new or worsening symptoms increased redness, swelling, pain, nausea, vomiting, fever, chills, etc...   Reviewed expectations re: course of current medical issues. Questions answered. Outlined signs and symptoms indicating need for more acute intervention. Patient verbalized understanding. After Visit Summary given.       Durward Parcel, FNP 02/29/20 1624

## 2020-02-29 NOTE — ED Triage Notes (Signed)
Pt has developed cellulitis in left upper arm. Pain began in shoulder

## 2020-04-24 ENCOUNTER — Other Ambulatory Visit: Payer: Medicare Other

## 2020-04-24 ENCOUNTER — Other Ambulatory Visit: Payer: Self-pay

## 2020-04-24 DIAGNOSIS — E782 Mixed hyperlipidemia: Secondary | ICD-10-CM

## 2020-04-24 DIAGNOSIS — E039 Hypothyroidism, unspecified: Secondary | ICD-10-CM

## 2020-04-24 DIAGNOSIS — K219 Gastro-esophageal reflux disease without esophagitis: Secondary | ICD-10-CM

## 2020-04-24 DIAGNOSIS — I10 Essential (primary) hypertension: Secondary | ICD-10-CM

## 2020-04-25 LAB — CBC WITH DIFFERENTIAL/PLATELET
Basophils Absolute: 0.1 10*3/uL (ref 0.0–0.2)
Basos: 1 %
EOS (ABSOLUTE): 0.2 10*3/uL (ref 0.0–0.4)
Eos: 5 %
Hematocrit: 38.1 % (ref 34.0–46.6)
Hemoglobin: 13 g/dL (ref 11.1–15.9)
Immature Grans (Abs): 0 10*3/uL (ref 0.0–0.1)
Immature Granulocytes: 0 %
Lymphocytes Absolute: 2 10*3/uL (ref 0.7–3.1)
Lymphs: 39 %
MCH: 30 pg (ref 26.6–33.0)
MCHC: 34.1 g/dL (ref 31.5–35.7)
MCV: 88 fL (ref 79–97)
Monocytes Absolute: 0.7 10*3/uL (ref 0.1–0.9)
Monocytes: 13 %
Neutrophils Absolute: 2.1 10*3/uL (ref 1.4–7.0)
Neutrophils: 42 %
Platelets: 222 10*3/uL (ref 150–450)
RBC: 4.34 x10E6/uL (ref 3.77–5.28)
RDW: 13.6 % (ref 11.7–15.4)
WBC: 5.1 10*3/uL (ref 3.4–10.8)

## 2020-04-25 LAB — LIPID PANEL
Chol/HDL Ratio: 2.6 ratio (ref 0.0–4.4)
Cholesterol, Total: 132 mg/dL (ref 100–199)
HDL: 51 mg/dL (ref >39)
LDL Chol Calc (NIH): 65 mg/dL (ref 0–99)
Triglycerides: 81 mg/dL (ref 0–149)
VLDL Cholesterol Cal: 16 mg/dL (ref 5–40)

## 2020-04-25 LAB — TSH: TSH: 2.66 u[IU]/mL (ref 0.450–4.500)

## 2020-04-30 ENCOUNTER — Ambulatory Visit (INDEPENDENT_AMBULATORY_CARE_PROVIDER_SITE_OTHER): Payer: Medicare Other | Admitting: Family Medicine

## 2020-04-30 ENCOUNTER — Encounter: Payer: Self-pay | Admitting: Family Medicine

## 2020-04-30 ENCOUNTER — Other Ambulatory Visit: Payer: Self-pay

## 2020-04-30 VITALS — BP 152/76 | HR 64 | Temp 97.6°F | Ht 64.0 in | Wt 139.0 lb

## 2020-04-30 DIAGNOSIS — R7301 Impaired fasting glucose: Secondary | ICD-10-CM | POA: Diagnosis not present

## 2020-04-30 DIAGNOSIS — E039 Hypothyroidism, unspecified: Secondary | ICD-10-CM

## 2020-04-30 DIAGNOSIS — Z1159 Encounter for screening for other viral diseases: Secondary | ICD-10-CM

## 2020-04-30 DIAGNOSIS — K219 Gastro-esophageal reflux disease without esophagitis: Secondary | ICD-10-CM

## 2020-04-30 DIAGNOSIS — E782 Mixed hyperlipidemia: Secondary | ICD-10-CM

## 2020-04-30 DIAGNOSIS — Z1211 Encounter for screening for malignant neoplasm of colon: Secondary | ICD-10-CM

## 2020-04-30 LAB — GLUCOSE HEMOCUE WAIVED: Glu Hemocue Waived: 126 mg/dL — ABNORMAL HIGH (ref 65–99)

## 2020-04-30 NOTE — Progress Notes (Signed)
BP (!) 152/76   Pulse 64   Temp 97.6 F (36.4 C)   Ht '5\' 4"'  (1.626 m)   Wt 139 lb (63 kg)   SpO2 98%   BMI 23.86 kg/m    Subjective:   Patient ID: April Duke, female    DOB: 1946-08-28, 74 y.o.   MRN: [id number]  HPI: April Duke is a 74 y.o. female presenting on 04/30/2020 for Medical Management of Chronic Issues   HPI Hypothyroidism recheck Patient is coming in for thyroid recheck today as well. They deny any issues with hair changes or heat or cold problems or diarrhea or constipation. They deny any chest pain or palpitations. They are currently on levothyroxine 50 micrograms   Hypertension Patient is currently on amlodipine and captopril and atenolol, and their blood pressure today is 152/76. Patient denies any lightheadedness or dizziness. Patient denies headaches, blurred vision, chest pains, shortness of breath, or weakness. Denies any side effects from medication and is content with current medication.   GERD Patient is currently on no medication currently.  She denies any major symptoms or abdominal pain or belching or burping. She denies any blood in her stool or lightheadedness or dizziness.   Patient has A. fib and is currently anticoagulated with Xarelto and denies any bruising or bleeding. Blood counts look good.  Patient did have an elevated fasting glucose last time we will keep an eye on it. A1c is 6.0 today  Relevant past medical, surgical, family and social history reviewed and updated as indicated. Interim medical history since our last visit reviewed. Allergies and medications reviewed and updated.  Review of Systems  Constitutional: Negative for chills and fever.  Eyes: Negative for visual disturbance.  Respiratory: Negative for chest tightness and shortness of breath.   Cardiovascular: Negative for chest pain and leg swelling.  Musculoskeletal: Negative for back pain and gait problem.  Skin: Negative for rash.  Neurological: Negative for  light-headedness and headaches.  Psychiatric/Behavioral: Negative for agitation and behavioral problems.  All other systems reviewed and are negative.   Per HPI unless specifically indicated above   Allergies as of 04/30/2020      Reactions   Sulfa Antibiotics Rash      Medication List       Accurate as of April 30, 2020  8:53 AM. If you have any questions, ask your nurse or doctor.        STOP taking these medications   amoxicillin-clavulanate 875-125 MG tablet Commonly known as: AUGMENTIN Stopped by: Fransisca Kaufmann Ariv Penrod, MD   cephALEXin 500 MG capsule Commonly known as: KEFLEX Stopped by: Fransisca Kaufmann Samah Lapiana, MD     TAKE these medications   amLODipine 5 MG tablet Commonly known as: NORVASC Take 1 tablet (5 mg total) by mouth daily.   atenolol 50 MG tablet Commonly known as: TENORMIN Take 1 tablet by mouth once daily   calcium carbonate 1500 (600 Ca) MG Tabs tablet Commonly known as: OSCAL Take by mouth 2 (two) times daily with a meal.   captopril 25 MG tablet Commonly known as: CAPOTEN Take 1 tablet (25 mg total) by mouth 2 (two) times daily.   multivitamin tablet Take 1 tablet by mouth daily.   rosuvastatin 5 MG tablet Commonly known as: CRESTOR Take 1 tablet by mouth daily.   Synthroid 50 MCG tablet Generic drug: levothyroxine Take 1 tablet (50 mcg total) by mouth daily.   Xarelto 20 MG Tabs tablet Generic drug: rivaroxaban Take 20 mg by mouth  daily.        Objective:   BP (!) 152/76   Pulse 64   Temp 97.6 F (36.4 C)   Ht '5\' 4"'  (1.626 m)   Wt 139 lb (63 kg)   SpO2 98%   BMI 23.86 kg/m   Wt Readings from Last 3 Encounters:  04/30/20 139 lb (63 kg)  12/27/19 144 lb 4 oz (65.4 kg)  08/29/19 145 lb 3.2 oz (65.9 kg)    Physical Exam Vitals and nursing note reviewed.  Constitutional:      General: She is not in acute distress.    Appearance: She is well-developed. She is not diaphoretic.  Eyes:     Conjunctiva/sclera: Conjunctivae  normal.  Cardiovascular:     Rate and Rhythm: Normal rate and regular rhythm.     Heart sounds: Normal heart sounds. No murmur heard.   Pulmonary:     Effort: Pulmonary effort is normal. No respiratory distress.     Breath sounds: Normal breath sounds. No wheezing.  Musculoskeletal:        General: No tenderness. Normal range of motion.  Skin:    General: Skin is warm and dry.     Findings: No rash.  Neurological:     Mental Status: She is alert and oriented to person, place, and time.     Coordination: Coordination normal.  Psychiatric:        Behavior: Behavior normal.     Results for orders placed or performed in visit on 04/24/20  TSH  Result Value Ref Range   TSH 2.660 0.450 - 4.500 uIU/mL  Lipid panel  Result Value Ref Range   Cholesterol, Total 132 100 - 199 mg/dL   Triglycerides 81 0 - 149 mg/dL   HDL 51 >39 mg/dL   VLDL Cholesterol Cal 16 5 - 40 mg/dL   LDL Chol Calc (NIH) 65 0 - 99 mg/dL   Chol/HDL Ratio 2.6 0.0 - 4.4 ratio  CBC with Differential/Platelet  Result Value Ref Range   WBC 5.1 3.4 - 10.8 x10E3/uL   RBC 4.34 3.77 - 5.28 x10E6/uL   Hemoglobin 13.0 11.1 - 15.9 g/dL   Hematocrit 38.1 34.0 - 46.6 %   MCV 88 79 - 97 fL   MCH 30.0 26.6 - 33.0 pg   MCHC 34.1 31 - 35 g/dL   RDW 13.6 11.7 - 15.4 %   Platelets 222 150 - 450 x10E3/uL   Neutrophils 42 Not Estab. %   Lymphs 39 Not Estab. %   Monocytes 13 Not Estab. %   Eos 5 Not Estab. %   Basos 1 Not Estab. %   Neutrophils Absolute 2.1 1 - 7 x10E3/uL   Lymphocytes Absolute 2.0 0 - 3 x10E3/uL   Monocytes Absolute 0.7 0 - 0 x10E3/uL   EOS (ABSOLUTE) 0.2 0.0 - 0.4 x10E3/uL   Basophils Absolute 0.1 0 - 0 x10E3/uL   Immature Granulocytes 0 Not Estab. %   Immature Grans (Abs) 0.0 0.0 - 0.1 x10E3/uL    Assessment & Plan:   Problem List Items Addressed This Visit      Digestive   GERD (gastroesophageal reflux disease)   Relevant Orders   CBC with Differential/Platelet     Endocrine   Hypothyroid    Relevant Orders   TSH     Other   Hyperlipemia    Other Visit Diagnoses    Elevated fasting glucose    -  Primary   Relevant Orders   CMP14+EGFR  Bayer DCA Hb A1c Waived   Glucose Hemocue Waived   Colon cancer screening       Relevant Orders   Cologuard   Need for hepatitis C screening test       Relevant Orders   Hepatitis C antibody      Blood work looked good from last time, A1c is 6.0 today. Patient seems to be doing well. Continue current medication. Her bone density had improved and shows improvement on her osteoporosis. Follow up plan: Return in about 4 months (around 08/31/2020), or if symptoms worsen or fail to improve, for hypothyroid.  Counseling provided for all of the vaccine components Orders Placed This Encounter  Procedures  . Cologuard  . Hepatitis C antibody  . CMP14+EGFR  . CBC with Differential/Platelet  . TSH  . Bayer DCA Hb A1c Waived  . Glucose Hemocue [place] Keyaira Clapham, MD [facility] 04/30/2020, 8:53 AM

## 2020-05-11 ENCOUNTER — Other Ambulatory Visit: Payer: Self-pay | Admitting: Family Medicine

## 2020-05-11 ENCOUNTER — Other Ambulatory Visit: Payer: Self-pay | Admitting: Cardiology

## 2020-05-12 NOTE — Telephone Encounter (Signed)
Rx(s) sent to pharmacy electronically.  

## 2020-05-20 LAB — COLOGUARD: Cologuard: NEGATIVE

## 2020-05-27 DIAGNOSIS — Z7189 Other specified counseling: Secondary | ICD-10-CM | POA: Insufficient documentation

## 2020-05-27 NOTE — Progress Notes (Signed)
Cardiology Office Note   Date:  05/27/2020   ID:  April Duke, DOB [birth year], MRN [id number]  PCP:  Dettinger, Elige Radon, MD  Cardiologist:   No primary care provider on file. Referring:  Dettinger, Elige Radon, MD  No chief complaint on file.     History of Present Illness: April Duke is a 74 y.o. female who is referred by Dettinger, Elige Radon, MD for evaluation of atrial fib.    She was previously seen by Dr. Billey Chang at [facility].   The patient has been on anticoagulation for several years.  She has had cardioversion in the past.    Since I last saw her she has done well.  The patient denies any new symptoms such as chest discomfort, neck or arm discomfort. There has been no new shortness of breath, PND or orthopnea. There have been no reported palpitations, presyncope or syncope.  She does notice when her heart is out of rhythm it does not bother her.  She might rarely take an extra half of atenolol if she is feeling a little anxious particularly having trouble sleeping at night.  She walks for exercise.  Past Medical History:  Diagnosis Date  . Atrial fibrillation ([facility])   . Hyperlipidemia   . Hypertension   . Osteoporosis   . Thyroid disease     No past surgical history on file.   Current Outpatient Medications  Medication Sig Dispense Refill  . amLODipine (NORVASC) 5 MG tablet Take 1 tablet (5 mg total) by mouth daily. 90 tablet 3  . atenolol (TENORMIN) 50 MG tablet Take 1 tablet by mouth once daily 90 tablet 1  . calcium carbonate (OSCAL) 1500 (600 Ca) MG TABS tablet Take by mouth 2 (two) times daily with a meal.    . captopril (CAPOTEN) 25 MG tablet Take 1 tablet by mouth twice daily 60 tablet 1  . Multiple Vitamin (MULTIVITAMIN) tablet Take 1 tablet by mouth daily.    . rosuvastatin (CRESTOR) 5 MG tablet Take 1 tablet by mouth daily.    Marland Kitchen SYNTHROID 50 MCG tablet Take 1 tablet (50 mcg total) by mouth daily. 90 tablet 1  . XARELTO 20 MG TABS tablet Take 20 mg by mouth  daily.     No current facility-administered medications for this visit.    Allergies:   Sulfa antibiotics    ROS:  Please see the history of present illness.   Otherwise, review of systems are positive for none.   All other systems are reviewed and negative.    PHYSICAL EXAM: VS:  There were no vitals taken for this visit. , BMI There is no height or weight on file to calculate BMI. GENERAL:  Well appearing NECK:  No jugular venous distention, waveform within normal limits, carotid upstroke brisk and symmetric, no bruits, no thyromegaly LUNGS:  Clear to auscultation bilaterally CHEST:  Unremarkable HEART:  PMI not displaced or sustained,S1 and S2 within normal limits, no S3, no clicks, no rubs, no murmurs, irregular ABD:  Flat, positive bowel sounds normal in frequency in pitch, no bruits, no rebound, no guarding, no midline pulsatile mass, no hepatomegaly, no splenomegaly EXT:  2 plus pulses throughout, no edema, no cyanosis no clubbing   EKG:  EKG is  ordered today. The ekg ordered today demonstrates atrial fibrillation, rate 61, axis within normal limits, intervals within normal limits, no acute ST-T wave changes.  Recent Labs: 12/21/2019: ALT 9; BUN 10; Creatinine, Ser 0.72; Potassium 4.3; Sodium 135  04/24/2020: Hemoglobin 13.0; Platelets 222; TSH 2.660    Lipid Panel    Component Value Date/Time   CHOL 132 04/24/2020 0938   TRIG 81 04/24/2020 0938   HDL 51 04/24/2020 0938   CHOLHDL 2.6 04/24/2020 0938   LDLCALC 65 04/24/2020 0938      Wt Readings from Last 3 Encounters:  04/30/20 139 lb (63 kg)  12/27/19 144 lb 4 oz (65.4 kg)  08/29/19 145 lb 3.2 oz (65.9 kg)      Other studies Reviewed: Additional studies/ records that were reviewed today includes:  Labs  Review of the above records demonstrates:  Please see elsewhere in the note.     ASSESSMENT AND PLAN:  PAF:  April Duke has a CHA2DS2 - VASc score of 3.  She tolerates this rhythm.  She tolerates  anticoagulation.   No change in therapy.  She is up-to-date with blood work.  She is on the current dose of anticoagulation.  HTN: The blood pressure is well controlled.  No change in therapy.   DYSLIPIDEMIA:   LDL was 65 in July.  No change in therapy.    COVID EDUCATION:  She has not had the vaccine.  Her sister had a bad reaction.  We talked about risk benefits.   Current medicines are reviewed at length with the patient today.  The patient does not have concerns regarding medicines.  The following changes have been made:  None  Labs/ tests ordered today include: None No orders of the defined types were placed in this encounter.    Disposition:   FU with me in 12 months    Signed, April Rotunda, MD  05/27/2020 10:51 AM    [facility]

## 2020-05-28 ENCOUNTER — Ambulatory Visit (INDEPENDENT_AMBULATORY_CARE_PROVIDER_SITE_OTHER): Payer: Medicare Other | Admitting: Cardiology

## 2020-05-28 ENCOUNTER — Encounter: Payer: Self-pay | Admitting: Cardiology

## 2020-05-28 ENCOUNTER — Other Ambulatory Visit: Payer: Self-pay

## 2020-05-28 VITALS — BP 146/80 | HR 68 | Ht 64.0 in | Wt 137.0 lb

## 2020-05-28 DIAGNOSIS — I482 Chronic atrial fibrillation, unspecified: Secondary | ICD-10-CM | POA: Diagnosis not present

## 2020-05-28 DIAGNOSIS — Z7189 Other specified counseling: Secondary | ICD-10-CM

## 2020-05-28 DIAGNOSIS — I10 Essential (primary) hypertension: Secondary | ICD-10-CM | POA: Diagnosis not present

## 2020-05-28 MED ORDER — XARELTO 20 MG PO TABS: 20.0000 mg | ORAL_TABLET | Freq: Every day | ORAL | 11 refills | Status: DC

## 2020-05-28 NOTE — Addendum Note (Signed)
Addended by: Sharin Grave on: 05/28/2020 01:56 PM   Modules accepted: Orders

## 2020-05-28 NOTE — Patient Instructions (Signed)
Medication Instructions:  The current medical regimen is effective;  continue present plan and medications.  *If you need a refill on your cardiac medications before your next appointment, please call your pharmacy*  Follow-Up: At [facility], you and your health needs are our priority.  As part of our continuing mission to provide you with exceptional heart care, we have created designated Provider Care Teams.  These Care Teams include your primary Cardiologist (physician) and Advanced Practice Providers (APPs -  Physician Assistants and Nurse Practitioners) who all work together to provide you with the care you need, when you need it.  We recommend signing up for the patient portal called "MyChart".  Sign up information is provided on this After Visit Summary.  MyChart is used to connect with patients for Virtual Visits (Telemedicine).  Patients are able to view lab/test results, encounter notes, upcoming appointments, etc.  Non-urgent messages can be sent to your provider as well.   To learn more about what you can do with MyChart, go to [email or web address].    Your next appointment:   12 month(s)  The format for your next appointment:   In Person  Provider:   James Hochrein, MD   Thank you for choosing [facility]!!     

## 2020-06-24 ENCOUNTER — Other Ambulatory Visit: Payer: Self-pay | Admitting: *Deleted

## 2020-06-24 MED ORDER — ROSUVASTATIN CALCIUM 5 MG PO TABS: 5.0000 mg | ORAL_TABLET | Freq: Every day | ORAL | 0 refills | Status: DC

## 2020-07-02 ENCOUNTER — Other Ambulatory Visit: Payer: Self-pay | Admitting: Family Medicine

## 2020-07-14 ENCOUNTER — Encounter: Payer: Self-pay | Admitting: Family Medicine

## 2020-07-14 ENCOUNTER — Other Ambulatory Visit: Payer: Self-pay

## 2020-07-14 ENCOUNTER — Ambulatory Visit (INDEPENDENT_AMBULATORY_CARE_PROVIDER_SITE_OTHER): Payer: Medicare Other | Admitting: Family Medicine

## 2020-07-14 DIAGNOSIS — U071 COVID-19: Secondary | ICD-10-CM | POA: Diagnosis not present

## 2020-07-14 NOTE — Progress Notes (Signed)
Virtual Visit via Telephone Note  I connected with April Duke on 07/14/20 at 4:26 PM by telephone and verified that I am speaking with the correct person using two identifiers. April Duke is currently located at home and her husband is currently with her during this visit. The provider, Gwenlyn Fudge, FNP is located in their office at time of visit.  I discussed the limitations, risks, security and privacy concerns of performing an evaluation and management service by telephone and the availability of in person appointments. I also discussed with the patient that there may be a patient responsible charge related to this service. The patient expressed understanding and agreed to proceed.  Subjective: PCP: Dettinger, Elige Radon, MD  Chief Complaint  Patient presents with  . Covid Positive   Patient reports she was diagnosed with COVID-19 2 days ago.  Her symptoms have actually been going on for 2 weeks and include weakness and a fever on and off that was easily reduced with Tylenol.  She does feel she is getting better, she was just wanting to know if there was something she could take for weakness.   ROS: Per HPI  Current Outpatient Medications:  .  amLODipine (NORVASC) 5 MG tablet, Take 1 tablet by mouth once daily, Disp: 90 tablet, Rfl: 0 .  atenolol (TENORMIN) 50 MG tablet, Take 1 tablet by mouth once daily, Disp: 90 tablet, Rfl: 1 .  calcium carbonate (OSCAL) 1500 (600 Ca) MG TABS tablet, Take by mouth 2 (two) times daily with a meal., Disp: , Rfl:  .  captopril (CAPOTEN) 25 MG tablet, Take 1 tablet by mouth twice daily, Disp: 60 tablet, Rfl: 1 .  Multiple Vitamin (MULTIVITAMIN) tablet, Take 1 tablet by mouth daily., Disp: , Rfl:  .  rosuvastatin (CRESTOR) 5 MG tablet, Take 1 tablet (5 mg total) by mouth daily., Disp: 90 tablet, Rfl: 0 .  SYNTHROID 50 MCG tablet, Take 1 tablet (50 mcg total) by mouth daily., Disp: 90 tablet, Rfl: 1 .  XARELTO 20 MG TABS tablet, Take 1 tablet (20 mg  total) by mouth daily., Disp: 30 tablet, Rfl: 11  Allergies  Allergen Reactions  . Sulfa Antibiotics Rash   Past Medical History:  Diagnosis Date  . Atrial fibrillation ([facility])   . Hyperlipidemia   . Hypertension   . Osteoporosis   . Thyroid disease     Observations/Objective: A&O  No respiratory distress or wheezing audible over the phone Mood, judgement, and thought processes all WNL  Assessment and Plan: 1. COVID-19 - Encouraged to try adding zinc, vitamin C, and vitamin D to her daily regimen, but discussed there is not a pill I can give her to help with weakness.  Encouraged her to stay active and well-hydrated.   Follow Up Instructions:  I discussed the assessment and treatment plan with the patient. The patient was provided an opportunity to ask questions and all were answered. The patient agreed with the plan and demonstrated an understanding of the instructions.   The patient was advised to call back or seek an in-person evaluation if the symptoms worsen or if the condition fails to improve as anticipated.  The above assessment and management plan was discussed with the patient. The patient verbalized understanding of and has agreed to the management plan. Patient is aware to call the clinic if symptoms persist or worsen. Patient is aware when to return to the clinic for a follow-up visit. Patient educated on when it is appropriate to go  to the emergency department.   Time call ended: 4:32 PM  I provided 8 minutes of non-face-to-face time during this encounter.  Deliah Boston, MSN, APRN, FNP-C [facility] 07/14/20

## 2020-07-18 ENCOUNTER — Other Ambulatory Visit: Payer: Self-pay | Admitting: Family Medicine

## 2020-08-27 ENCOUNTER — Other Ambulatory Visit: Payer: Self-pay

## 2020-08-27 ENCOUNTER — Other Ambulatory Visit: Payer: Medicare Other

## 2020-08-27 DIAGNOSIS — R7301 Impaired fasting glucose: Secondary | ICD-10-CM

## 2020-08-27 DIAGNOSIS — E039 Hypothyroidism, unspecified: Secondary | ICD-10-CM

## 2020-08-27 DIAGNOSIS — Z1159 Encounter for screening for other viral diseases: Secondary | ICD-10-CM

## 2020-08-27 DIAGNOSIS — K219 Gastro-esophageal reflux disease without esophagitis: Secondary | ICD-10-CM

## 2020-08-28 LAB — CBC WITH DIFFERENTIAL/PLATELET
Basophils Absolute: 0.1 10*3/uL (ref 0.0–0.2)
Basos: 1 %
EOS (ABSOLUTE): 0.2 10*3/uL (ref 0.0–0.4)
Eos: 5 %
Hematocrit: 43.9 % (ref 34.0–46.6)
Hemoglobin: 14.2 g/dL (ref 11.1–15.9)
Immature Grans (Abs): 0 10*3/uL (ref 0.0–0.1)
Immature Granulocytes: 0 %
Lymphocytes Absolute: 1.6 10*3/uL (ref 0.7–3.1)
Lymphs: 35 %
MCH: 29 pg (ref 26.6–33.0)
MCHC: 32.3 g/dL (ref 31.5–35.7)
MCV: 90 fL (ref 79–97)
Monocytes Absolute: 0.6 10*3/uL (ref 0.1–0.9)
Monocytes: 13 %
Neutrophils Absolute: 2.1 10*3/uL (ref 1.4–7.0)
Neutrophils: 46 %
Platelets: 264 10*3/uL (ref 150–450)
RBC: 4.89 x10E6/uL (ref 3.77–5.28)
RDW: 13.9 % (ref 11.7–15.4)
WBC: 4.5 10*3/uL (ref 3.4–10.8)

## 2020-08-28 LAB — TSH: TSH: 2 u[IU]/mL (ref 0.450–4.500)

## 2020-08-28 LAB — CMP14+EGFR
ALT: 11 IU/L (ref 0–32)
AST: 19 IU/L (ref 0–40)
Albumin/Globulin Ratio: 1.6 (ref 1.2–2.2)
Albumin: 5 g/dL — ABNORMAL HIGH (ref 3.7–4.7)
Alkaline Phosphatase: 89 IU/L (ref 44–121)
BUN/Creatinine Ratio: 10 — ABNORMAL LOW (ref 12–28)
BUN: 7 mg/dL — ABNORMAL LOW (ref 8–27)
Bilirubin Total: 0.5 mg/dL (ref 0.0–1.2)
CO2: 24 mmol/L (ref 20–29)
Calcium: 10.4 mg/dL — ABNORMAL HIGH (ref 8.7–10.3)
Chloride: 97 mmol/L (ref 96–106)
Creatinine, Ser: 0.73 mg/dL (ref 0.57–1.00)
GFR calc Af Amer: 93 mL/min/{1.73_m2} (ref >59)
GFR calc non Af Amer: 81 mL/min/{1.73_m2} (ref >59)
Globulin, Total: 3.2 g/dL (ref 1.5–4.5)
Glucose: 128 mg/dL — ABNORMAL HIGH (ref 65–99)
Potassium: 4.2 mmol/L (ref 3.5–5.2)
Sodium: 136 mmol/L (ref 134–144)
Total Protein: 8.2 g/dL (ref 6.0–8.5)

## 2020-08-28 LAB — HEPATITIS C ANTIBODY: Hep C Virus Ab: <0.1 s/co ratio (ref 0.0–0.9)

## 2020-09-01 ENCOUNTER — Other Ambulatory Visit: Payer: Self-pay

## 2020-09-01 ENCOUNTER — Encounter: Payer: Self-pay | Admitting: Family Medicine

## 2020-09-01 ENCOUNTER — Ambulatory Visit (INDEPENDENT_AMBULATORY_CARE_PROVIDER_SITE_OTHER): Payer: Medicare Other | Admitting: Family Medicine

## 2020-09-01 VITALS — BP 176/81 | HR 57 | Temp 97.4°F | Ht 64.0 in | Wt 134.0 lb

## 2020-09-01 DIAGNOSIS — R739 Hyperglycemia, unspecified: Secondary | ICD-10-CM

## 2020-09-01 DIAGNOSIS — K219 Gastro-esophageal reflux disease without esophagitis: Secondary | ICD-10-CM | POA: Diagnosis not present

## 2020-09-01 DIAGNOSIS — E039 Hypothyroidism, unspecified: Secondary | ICD-10-CM

## 2020-09-01 DIAGNOSIS — I10 Essential (primary) hypertension: Secondary | ICD-10-CM | POA: Diagnosis not present

## 2020-09-01 DIAGNOSIS — I480 Paroxysmal atrial fibrillation: Secondary | ICD-10-CM

## 2020-09-01 DIAGNOSIS — E782 Mixed hyperlipidemia: Secondary | ICD-10-CM | POA: Diagnosis not present

## 2020-09-01 LAB — BAYER DCA HB A1C WAIVED: HB A1C (BAYER DCA - WAIVED): 5.7 % (ref <7.0)

## 2020-09-01 MED ORDER — ATENOLOL 50 MG PO TABS
50.0000 mg | ORAL_TABLET | Freq: Every day | ORAL | 3 refills | Status: DC
Start: 2020-09-01 — End: 2020-11-06

## 2020-09-01 MED ORDER — SYNTHROID 50 MCG PO TABS
50.0000 ug | ORAL_TABLET | Freq: Every day | ORAL | 3 refills | Status: DC
Start: 2020-09-01 — End: 2020-10-15

## 2020-09-01 MED ORDER — ROSUVASTATIN CALCIUM 5 MG PO TABS
5.0000 mg | ORAL_TABLET | Freq: Every day | ORAL | 3 refills | Status: DC
Start: 2020-09-01 — End: 2020-09-16

## 2020-09-01 MED ORDER — AMLODIPINE BESYLATE 5 MG PO TABS: 5.0000 mg | ORAL_TABLET | Freq: Every day | ORAL | 3 refills | Status: DC

## 2020-09-01 NOTE — Progress Notes (Signed)
BP (!) 176/81    Pulse (!) 57    Temp (!) 97.4 F (36.3 C)    Ht '5\' 4"'  (1.626 m)    Wt 134 lb (60.8 kg)    SpO2 100%    BMI 23.00 kg/m    Subjective:   Patient ID: April Duke, female    DOB: 03-Jul-1946, 74 y.o.   MRN: [id number]  HPI: April Duke is a 74 y.o. female presenting on 09/01/2020 for Medical Management of Chronic Issues, Hypertension, Hypothyroidism, and Atrial Fibrillation   HPI Hypothyroidism recheck Patient is coming in for thyroid recheck today as well. They deny any issues with hair changes or heat or cold problems or diarrhea or constipation. They deny any chest pain or palpitations. They are currently on levothyroxine 50 micrograms   Hypertension Patient is currently on amlodipine and atenolol and captopril, and their blood pressure today is 176/81 the patient does admit she did not take her medicines today, she checked it at home last week and it was 115/62.Marland Kitchen Patient denies any lightheadedness or dizziness. Patient denies headaches, blurred vision, chest pains, shortness of breath, or weakness. Denies any side effects from medication and is content with current medication.   GERD Patient is currently on no medication currently has been doing well..  She denies any major symptoms or abdominal pain or belching or burping. She denies any blood in her stool or lightheadedness or dizziness.   Hyperlipidemia Patient is coming in for recheck of his hyperlipidemia. The patient is currently taking Crestor. They deny any issues with myalgias or history of liver damage from it. They deny any focal numbness or weakness or chest pain.   Patient is on Xarelto for A. fib and denies any bruising or bleeding.  She says she gets palpitations or flutters every now and then but not back consistently.  Patient has some right hip soreness that is been bothering her off and on but definitely worse with the weather change.  She did have history of fracture in that hip.  Recommend stretching  and Tylenol and heating pad.  Relevant past medical, surgical, family and social history reviewed and updated as indicated. Interim medical history since our last visit reviewed. Allergies and medications reviewed and updated.  Review of Systems  Constitutional: Negative for chills and fever.  Eyes: Negative for visual disturbance.  Respiratory: Negative for chest tightness and shortness of breath.   Cardiovascular: Negative for chest pain and leg swelling.  Musculoskeletal: Positive for arthralgias. Negative for back pain and gait problem.  Skin: Negative for rash.  Neurological: Negative for light-headedness and headaches.  Psychiatric/Behavioral: Negative for agitation and behavioral problems.  All other systems reviewed and are negative.   Per HPI unless specifically indicated above   Allergies as of 09/01/2020      Reactions   Sulfa Antibiotics Rash      Medication List       Accurate as of September 01, 2020  8:43 AM. If you have any questions, ask your nurse or doctor.        amLODipine 5 MG tablet Commonly known as: NORVASC Take 1 tablet (5 mg total) by mouth daily.   atenolol 50 MG tablet Commonly known as: TENORMIN Take 1 tablet (50 mg total) by mouth daily.   calcium carbonate 1500 (600 Ca) MG Tabs tablet Commonly known as: OSCAL Take by mouth 2 (two) times daily with a meal.   captopril 25 MG tablet Commonly known as: CAPOTEN Take 1 tablet  by mouth twice daily   multivitamin tablet Take 1 tablet by mouth daily.   rosuvastatin 5 MG tablet Commonly known as: CRESTOR Take 1 tablet (5 mg total) by mouth daily.   Synthroid 50 MCG tablet Generic drug: levothyroxine Take 1 tablet (50 mcg total) by mouth daily. What changed: how much to take Changed by: Fransisca Kaufmann Narek Kniss, MD   Xarelto 20 MG Tabs tablet Generic drug: rivaroxaban Take 1 tablet (20 mg total) by mouth daily.        Objective:   BP (!) 176/81    Pulse (!) 57    Temp (!) 97.4 F  (36.3 C)    Ht '5\' 4"'  (1.626 m)    Wt 134 lb (60.8 kg)    SpO2 100%    BMI 23.00 kg/m   Wt Readings from Last 3 Encounters:  09/01/20 134 lb (60.8 kg)  05/28/20 137 lb (62.1 kg)  04/30/20 139 lb (63 kg)    Physical Exam Vitals and nursing note reviewed.  Constitutional:      General: She is not in acute distress.    Appearance: She is well-developed. She is not diaphoretic.  Eyes:     Conjunctiva/sclera: Conjunctivae normal.  Cardiovascular:     Rate and Rhythm: Normal rate and regular rhythm.     Heart sounds: Normal heart sounds. No murmur heard.   Pulmonary:     Effort: Pulmonary effort is normal. No respiratory distress.     Breath sounds: Normal breath sounds. No wheezing.  Musculoskeletal:        General: No swelling or tenderness. Normal range of motion.     Right hip: No deformity, lacerations, tenderness or bony tenderness. Normal range of motion. Normal strength.     Comments: Pain in right hip with range of motion but not to palpation  Skin:    General: Skin is warm and dry.     Findings: No rash.  Neurological:     Mental Status: She is alert and oriented to person, place, and time.     Coordination: Coordination normal.  Psychiatric:        Behavior: Behavior normal.     Results for orders placed or performed in visit on 08/27/20  TSH  Result Value Ref Range   TSH 2.000 0.450 - 4.500 uIU/mL  CBC with Differential/Platelet  Result Value Ref Range   WBC 4.5 3.4 - 10.8 x10E3/uL   RBC 4.89 3.77 - 5.28 x10E6/uL   Hemoglobin 14.2 11.1 - 15.9 g/dL   Hematocrit 43.9 34.0 - 46.6 %   MCV 90 79 - 97 fL   MCH 29.0 26.6 - 33.0 pg   MCHC 32.3 31 - 35 g/dL   RDW 13.9 11.7 - 15.4 %   Platelets 264 150 - 450 x10E3/uL   Neutrophils 46 Not Estab. %   Lymphs 35 Not Estab. %   Monocytes 13 Not Estab. %   Eos 5 Not Estab. %   Basos 1 Not Estab. %   Neutrophils Absolute 2.1 1.40 - 7.00 x10E3/uL   Lymphocytes Absolute 1.6 0 - 3 x10E3/uL   Monocytes Absolute 0.6 0 - 0  x10E3/uL   EOS (ABSOLUTE) 0.2 0.0 - 0.4 x10E3/uL   Basophils Absolute 0.1 0 - 0 x10E3/uL   Immature Granulocytes 0 Not Estab. %   Immature Grans (Abs) 0.0 0.0 - 0.1 x10E3/uL  CMP14+EGFR  Result Value Ref Range   Glucose 128 (H) 65 - 99 mg/dL   BUN 7 (L) 8 -  27 mg/dL   Creatinine, Ser 0.73 0.57 - 1.00 mg/dL   GFR calc non Af Amer 81 >59 mL/min/1.73   GFR calc Af Amer 93 >59 mL/min/1.73   BUN/Creatinine Ratio 10 (L) 12 - 28   Sodium 136 134 - 144 mmol/L   Potassium 4.2 3.5 - 5.2 mmol/L   Chloride 97 96 - 106 mmol/L   CO2 24 20 - 29 mmol/L   Calcium 10.4 (H) 8.7 - 10.3 mg/dL   Total Protein 8.2 6.0 - 8.5 g/dL   Albumin 5.0 (H) 3.7 - 4.7 g/dL   Globulin, Total 3.2 1.5 - 4.5 g/dL   Albumin/Globulin Ratio 1.6 1.2 - 2.2   Bilirubin Total 0.5 0.0 - 1.2 mg/dL   Alkaline Phosphatase 89 44 - 121 IU/L   AST 19 0 - 40 IU/L   ALT 11 0 - 32 IU/L  Hepatitis C antibody  Result Value Ref Range   Hep C Virus Ab <0.1 0.0 - 0.9 s/co ratio    Assessment & Plan:   Problem List Items Addressed This Visit      Cardiovascular and Mediastinum   A-fib (HCC)   Relevant Medications   amLODipine (NORVASC) 5 MG tablet   atenolol (TENORMIN) 50 MG tablet   rosuvastatin (CRESTOR) 5 MG tablet   HTN (hypertension)   Relevant Medications   amLODipine (NORVASC) 5 MG tablet   atenolol (TENORMIN) 50 MG tablet   rosuvastatin (CRESTOR) 5 MG tablet     Digestive   GERD (gastroesophageal reflux disease)     Endocrine   Hypothyroid - Primary   Relevant Medications   atenolol (TENORMIN) 50 MG tablet   SYNTHROID 50 MCG tablet     Other   Hyperlipemia   Relevant Medications   amLODipine (NORVASC) 5 MG tablet   atenolol (TENORMIN) 50 MG tablet   rosuvastatin (CRESTOR) 5 MG tablet    Other Visit Diagnoses    Elevated blood sugar       Relevant Orders   Bayer DCA Hb A1c Waived      Right hip pain is likely arthritis, recommended stretching and heating pad and Tylenol.  Continue blood pressure  medications, likely elevated because she did not take her medications this morning.  She says running good at home, and will keep a log for 2 weeks and bring it back with her next time.  Patient's blood sugar was 128 fasting, will add on an A1c to be done today. Follow up plan: Return in about 6 months (around 03/01/2021), or if symptoms worsen or fail to improve, for Hypertension and thyroid and cholesterol.  Counseling provided for all of the vaccine components Orders Placed This Encounter  Procedures   Bayer [place] Hb A1c [place] Manu Rubey, MD [facility] 09/01/2020, 8:43 AM

## 2020-09-08 ENCOUNTER — Other Ambulatory Visit: Payer: Self-pay | Admitting: Cardiology

## 2020-09-08 NOTE — Telephone Encounter (Signed)
Rx has been sent to the pharmacy electronically. ° °

## 2020-09-16 ENCOUNTER — Other Ambulatory Visit: Payer: Self-pay | Admitting: Family Medicine

## 2020-09-23 ENCOUNTER — Telehealth: Payer: Self-pay

## 2020-09-23 NOTE — Telephone Encounter (Signed)
Spoke to [facility] - patient is not due for DXA and not order was sent to them - patient had done at our location this year - not sure why appointment was made by their office.

## 2020-09-23 NOTE — Telephone Encounter (Signed)
April Duke called from [facility] stating that they need new order for Bone Density sent to them ASAP.  Can fax order to [phone]

## 2020-09-30 ENCOUNTER — Other Ambulatory Visit: Payer: Self-pay | Admitting: Family Medicine

## 2020-10-01 ENCOUNTER — Other Ambulatory Visit: Payer: Self-pay | Admitting: Family Medicine

## 2020-10-15 ENCOUNTER — Other Ambulatory Visit: Payer: Self-pay | Admitting: Family Medicine

## 2020-11-06 ENCOUNTER — Other Ambulatory Visit: Payer: Self-pay | Admitting: Family Medicine

## 2020-12-26 ENCOUNTER — Other Ambulatory Visit: Payer: Self-pay

## 2020-12-26 ENCOUNTER — Other Ambulatory Visit: Payer: Medicare Other

## 2020-12-26 DIAGNOSIS — R739 Hyperglycemia, unspecified: Secondary | ICD-10-CM

## 2020-12-26 DIAGNOSIS — M810 Age-related osteoporosis without current pathological fracture: Secondary | ICD-10-CM

## 2020-12-26 DIAGNOSIS — E039 Hypothyroidism, unspecified: Secondary | ICD-10-CM

## 2020-12-26 DIAGNOSIS — E782 Mixed hyperlipidemia: Secondary | ICD-10-CM

## 2020-12-26 DIAGNOSIS — I10 Essential (primary) hypertension: Secondary | ICD-10-CM

## 2020-12-26 LAB — BAYER DCA HB A1C WAIVED: HB A1C (BAYER DCA - WAIVED): 5.8 % (ref <7.0)

## 2020-12-27 LAB — CBC WITH DIFFERENTIAL/PLATELET
Basophils Absolute: 0 10*3/uL (ref 0.0–0.2)
Basos: 1 %
EOS (ABSOLUTE): 0.1 10*3/uL (ref 0.0–0.4)
Eos: 3 %
Hematocrit: 41.5 % (ref 34.0–46.6)
Hemoglobin: 13.9 g/dL (ref 11.1–15.9)
Immature Grans (Abs): 0 10*3/uL (ref 0.0–0.1)
Immature Granulocytes: 0 %
Lymphocytes Absolute: 1.5 10*3/uL (ref 0.7–3.1)
Lymphs: 38 %
MCH: 30 pg (ref 26.6–33.0)
MCHC: 33.5 g/dL (ref 31.5–35.7)
MCV: 90 fL (ref 79–97)
Monocytes Absolute: 0.6 10*3/uL (ref 0.1–0.9)
Monocytes: 16 %
Neutrophils Absolute: 1.6 10*3/uL (ref 1.4–7.0)
Neutrophils: 42 %
Platelets: 218 10*3/uL (ref 150–450)
RBC: 4.63 x10E6/uL (ref 3.77–5.28)
RDW: 13.1 % (ref 11.7–15.4)
WBC: 3.8 10*3/uL (ref 3.4–10.8)

## 2020-12-27 LAB — CMP14+EGFR
ALT: 12 IU/L (ref 0–32)
AST: 20 IU/L (ref 0–40)
Albumin/Globulin Ratio: 1.6 (ref 1.2–2.2)
Albumin: 4.8 g/dL — ABNORMAL HIGH (ref 3.7–4.7)
Alkaline Phosphatase: 94 IU/L (ref 44–121)
BUN/Creatinine Ratio: 11 — ABNORMAL LOW (ref 12–28)
BUN: 8 mg/dL (ref 8–27)
Bilirubin Total: 0.4 mg/dL (ref 0.0–1.2)
CO2: 23 mmol/L (ref 20–29)
Calcium: 10.1 mg/dL (ref 8.7–10.3)
Chloride: 97 mmol/L (ref 96–106)
Creatinine, Ser: 0.71 mg/dL (ref 0.57–1.00)
Globulin, Total: 3 g/dL (ref 1.5–4.5)
Glucose: 116 mg/dL — ABNORMAL HIGH (ref 65–99)
Potassium: 4.6 mmol/L (ref 3.5–5.2)
Sodium: 137 mmol/L (ref 134–144)
Total Protein: 7.8 g/dL (ref 6.0–8.5)
eGFR: 89 mL/min/{1.73_m2} (ref >59)

## 2020-12-27 LAB — LIPID PANEL
Chol/HDL Ratio: 2.7 ratio (ref 0.0–4.4)
Cholesterol, Total: 144 mg/dL (ref 100–199)
HDL: 54 mg/dL (ref >39)
LDL Chol Calc (NIH): 74 mg/dL (ref 0–99)
Triglycerides: 85 mg/dL (ref 0–149)
VLDL Cholesterol Cal: 16 mg/dL (ref 5–40)

## 2020-12-27 LAB — TSH: TSH: 1.92 u[IU]/mL (ref 0.450–4.500)

## 2020-12-27 LAB — VITAMIN D 25 HYDROXY (VIT D DEFICIENCY, FRACTURES): Vit D, 25-Hydroxy: 47.1 ng/mL (ref 30.0–100.0)

## 2020-12-31 ENCOUNTER — Other Ambulatory Visit: Payer: Self-pay

## 2020-12-31 ENCOUNTER — Encounter: Payer: Self-pay | Admitting: Family Medicine

## 2020-12-31 ENCOUNTER — Ambulatory Visit (INDEPENDENT_AMBULATORY_CARE_PROVIDER_SITE_OTHER): Payer: Medicare Other | Admitting: Family Medicine

## 2020-12-31 VITALS — BP 175/85 | HR 58 | Ht 64.0 in | Wt 133.5 lb

## 2020-12-31 DIAGNOSIS — I480 Paroxysmal atrial fibrillation: Secondary | ICD-10-CM | POA: Diagnosis not present

## 2020-12-31 DIAGNOSIS — E782 Mixed hyperlipidemia: Secondary | ICD-10-CM

## 2020-12-31 DIAGNOSIS — E039 Hypothyroidism, unspecified: Secondary | ICD-10-CM

## 2020-12-31 DIAGNOSIS — I10 Essential (primary) hypertension: Secondary | ICD-10-CM | POA: Diagnosis not present

## 2020-12-31 DIAGNOSIS — M19041 Primary osteoarthritis, right hand: Secondary | ICD-10-CM

## 2020-12-31 DIAGNOSIS — K219 Gastro-esophageal reflux disease without esophagitis: Secondary | ICD-10-CM

## 2020-12-31 MED ORDER — ATENOLOL 50 MG PO TABS: 50.0000 mg | ORAL_TABLET | Freq: Every day | ORAL | 3 refills | Status: DC

## 2020-12-31 MED ORDER — DICLOFENAC SODIUM 1 % EX GEL: 2.0000 g | Freq: Four times a day (QID) | CUTANEOUS | 3 refills | Status: DC

## 2020-12-31 NOTE — Progress Notes (Signed)
BP (!) 175/85   Pulse (!) 58   Ht '5\' 4"'  (1.626 m)   Wt 133 lb 8 oz (60.6 kg)   SpO2 98%   BMI 22.92 kg/m    Subjective:   Patient ID: April Duke, female    DOB: [birth year], 75 y.o.   MRN: [id number]  HPI: April Duke is a 75 y.o. female presenting on 12/31/2020 for Medical Management of Chronic Issues, Hypothyroidism, Hypertension, and Atrial Fibrillation   HPI Hypothyroidism recheck Patient is coming in for thyroid recheck today as well. They deny any issues with hair changes or heat or cold problems or diarrhea or constipation. They deny any chest pain or palpitations. They are currently on levothyroxine 50 micrograms h  Hyperlipidemia Patient is coming in for recheck of his hyperlipidemia. The patient is currently taking Crestor. They deny any issues with myalgias or history of liver damage from it. They deny any focal numbness or weakness or chest pain.   Hypertension Patient is currently on amlodipine and atenolol and captopril, and their blood pressure today is 162/69, at home she says is running 120/60, has multiple numbers over the past couple weeks.. Patient denies any lightheadedness or dizziness. Patient denies headaches, blurred vision, chest pains, shortness of breath, or weakness. Denies any side effects from medication and is content with current medication.   GERD Patient is currently on no medication currently denies any issues..  She denies any major symptoms or abdominal pain or belching or burping. She denies any blood in her stool or lightheadedness or dizziness.   Patient has A. fib and currently takes Xarelto.  She denies any issues with bruising or bleeding.  Relevant past medical, surgical, family and social history reviewed and updated as indicated. Interim medical history since our last visit reviewed. Allergies and medications reviewed and updated.  Review of Systems  Constitutional: Negative for chills and fever.  Eyes: Negative for visual disturbance.   Respiratory: Negative for chest tightness and shortness of breath.   Cardiovascular: Negative for chest pain and leg swelling.  Gastrointestinal: Negative for abdominal pain and blood in stool.  Musculoskeletal: Negative for back pain and gait problem.  Skin: Negative for rash.  Neurological: Negative for light-headedness and headaches.  Psychiatric/Behavioral: Negative for agitation and behavioral problems.  All other systems reviewed and are negative.   Per HPI unless specifically indicated above   Allergies as of 12/31/2020      Reactions   Sulfa Antibiotics Rash      Medication List       Accurate as of December 31, 2020  8:57 AM. If you have any questions, ask your nurse or doctor.        amLODipine 5 MG tablet Commonly known as: NORVASC Take 1 tablet (5 mg total) by mouth daily.   atenolol 50 MG tablet Commonly known as: TENORMIN Take 1 tablet (50 mg total) by mouth daily.   calcium carbonate 1500 (600 Ca) MG Tabs tablet Commonly known as: OSCAL Take by mouth 2 (two) times daily with a meal.   captopril 25 MG tablet Commonly known as: CAPOTEN Take 1 tablet by mouth twice daily   multivitamin tablet Take 1 tablet by mouth daily.   rosuvastatin 5 MG tablet Commonly known as: CRESTOR Take 1 tablet by mouth once daily   Synthroid 50 MCG tablet Generic drug: levothyroxine Take 1 tablet by mouth once daily   Xarelto 20 MG Tabs tablet Generic drug: rivaroxaban Take 1 tablet (20 mg total) by mouth  daily.        Objective:   BP (!) 175/85   Pulse (!) 58   Ht '5\' 4"'  (1.626 m)   Wt 133 lb 8 oz (60.6 kg)   SpO2 98%   BMI 22.92 kg/m   Wt Readings from Last 3 Encounters:  12/31/20 133 lb 8 oz (60.6 kg)  09/01/20 134 lb (60.8 kg)  05/28/20 137 lb (62.1 kg)    Physical Exam Vitals and nursing note reviewed.  Constitutional:      General: She is not in acute distress.    Appearance: She is well-developed and well-nourished. She is not diaphoretic.  Eyes:      Extraocular Movements: EOM normal.     Conjunctiva/sclera: Conjunctivae normal.  Cardiovascular:     Rate and Rhythm: Normal rate and regular rhythm.     Pulses: Intact distal pulses.     Heart sounds: Normal heart sounds. No murmur heard.   Pulmonary:     Effort: Pulmonary effort is normal. No respiratory distress.     Breath sounds: Normal breath sounds. No wheezing.  Musculoskeletal:        General: No tenderness or edema. Normal range of motion.  Skin:    General: Skin is warm and dry.     Findings: No rash.  Neurological:     Mental Status: She is alert and oriented to person, place, and time.     Coordination: Coordination normal.  Psychiatric:        Mood and Affect: Mood and affect normal.        Behavior: Behavior normal.     Results for orders placed or performed in visit on 12/26/20  CBC with Differential/Platelet  Result Value Ref Range   WBC 3.8 3.4 - 10.8 x10E3/uL   RBC 4.63 3.77 - 5.28 x10E6/uL   Hemoglobin 13.9 11.1 - 15.9 g/dL   Hematocrit 41.5 34.0 - 46.6 %   MCV 90 79 - 97 fL   MCH 30.0 26.6 - 33.0 pg   MCHC 33.5 31.5 - 35.7 g/dL   RDW 13.1 11.7 - 15.4 %   Platelets 218 150 - 450 x10E3/uL   Neutrophils 42 Not Estab. %   Lymphs 38 Not Estab. %   Monocytes 16 Not Estab. %   Eos 3 Not Estab. %   Basos 1 Not Estab. %   Neutrophils Absolute 1.6 1.4 - 7.0 x10E3/uL   Lymphocytes Absolute 1.5 0.7 - 3.1 x10E3/uL   Monocytes Absolute 0.6 0.1 - 0.9 x10E3/uL   EOS (ABSOLUTE) 0.1 0.0 - 0.4 x10E3/uL   Basophils Absolute 0.0 0.0 - 0.2 x10E3/uL   Immature Granulocytes 0 Not Estab. %   Immature Grans (Abs) 0.0 0.0 - 0.1 x10E3/uL  CMP14+EGFR  Result Value Ref Range   Glucose 116 (H) 65 - 99 mg/dL   BUN 8 8 - 27 mg/dL   Creatinine, Ser 0.71 0.57 - 1.00 mg/dL   eGFR 89 >59 mL/min/1.73   BUN/Creatinine Ratio 11 (L) 12 - 28   Sodium 137 134 - 144 mmol/L   Potassium 4.6 3.5 - 5.2 mmol/L   Chloride 97 96 - 106 mmol/L   CO2 23 20 - 29 mmol/L   Calcium 10.1  8.7 - 10.3 mg/dL   Total Protein 7.8 6.0 - 8.5 g/dL   Albumin 4.8 (H) 3.7 - 4.7 g/dL   Globulin, Total 3.0 1.5 - 4.5 g/dL   Albumin/Globulin Ratio 1.6 1.2 - 2.2   Bilirubin Total 0.4 0.0 - 1.2 mg/dL  Alkaline Phosphatase 94 44 - 121 IU/L   AST 20 0 - 40 IU/L   ALT 12 0 - 32 IU/L  Lipid panel  Result Value Ref Range   Cholesterol, Total 144 100 - 199 mg/dL   Triglycerides 85 0 - 149 mg/dL   HDL 54 >39 mg/dL   VLDL Cholesterol Cal 16 5 - 40 mg/dL   LDL Chol Calc (NIH) 74 0 - 99 mg/dL   Chol/HDL Ratio 2.7 0.0 - 4.4 ratio  TSH  Result Value Ref Range   TSH 1.920 0.450 - 4.500 uIU/mL  VITAMIN D 25 Hydroxy (Vit-D Deficiency, Fractures)  Result Value Ref Range   Vit D, 25-Hydroxy 47.1 30.0 - 100.0 ng/mL  Bayer DCA Hb A1c Waived  Result Value Ref Range   HB A1C (BAYER DCA - WAIVED) 5.8 <7.0 %    Assessment & Plan:   Problem List Items Addressed This Visit      Cardiovascular and Mediastinum   A-fib (HCC)   Relevant Medications   atenolol (TENORMIN) 50 MG tablet   HTN (hypertension)   Relevant Medications   atenolol (TENORMIN) 50 MG tablet   Other Relevant Orders   CMP14+EGFR   TSH     Digestive   GERD (gastroesophageal reflux disease)   Relevant Orders   CBC with Differential/Platelet     Endocrine   Hypothyroid - Primary   Relevant Medications   atenolol (TENORMIN) 50 MG tablet   Other Relevant Orders   TSH     Other   Hyperlipemia   Relevant Medications   atenolol (TENORMIN) 50 MG tablet    Other Visit Diagnoses    OA (osteoarthritis) of finger, right       Relevant Medications   diclofenac Sodium (VOLTAREN) 1 % GEL      Patient has good home blood pressures over the past couple weeks that run in the 120s over 60s consistently, I think she just gets high when she comes here.  Blood work looks good. Follow up plan: Return in about 4 months (around 05/02/2021), or if symptoms worsen or fail to improve, for Hypothyroidism and hypertension and  GERD.  Counseling provided for all of the vaccine components Orders Placed This Encounter  Procedures  . CBC with Differential/Platelet  . CMP14+EGFR  . TSH    Caryl Pina, MD [facility] 12/31/2020, 8:57 AM

## 2021-01-22 ENCOUNTER — Ambulatory Visit
Admission: EM | Admit: 2021-01-22 | Discharge: 2021-01-22 | Disposition: A | Discharge status: Home / Self Care | Payer: Medicare Other | Attending: Internal Medicine | Admitting: Internal Medicine

## 2021-01-22 ENCOUNTER — Ambulatory Visit (INDEPENDENT_AMBULATORY_CARE_PROVIDER_SITE_OTHER): Payer: Medicare Other

## 2021-01-22 ENCOUNTER — Other Ambulatory Visit: Payer: Self-pay

## 2021-01-22 ENCOUNTER — Encounter: Payer: Self-pay | Admitting: Emergency Medicine

## 2021-01-22 DIAGNOSIS — S20211A Contusion of right front wall of thorax, initial encounter: Secondary | ICD-10-CM

## 2021-01-22 DIAGNOSIS — W2209XA Striking against other stationary object, initial encounter: Secondary | ICD-10-CM

## 2021-01-22 DIAGNOSIS — S2231XA Fracture of one rib, right side, initial encounter for closed fracture: Secondary | ICD-10-CM

## 2021-01-22 MED ORDER — LIDOCAINE 5 % EX PTCH: 1.0000 | MEDICATED_PATCH | CUTANEOUS | 0 refills | Status: DC

## 2021-01-22 NOTE — ED Provider Notes (Signed)
[facility]    CSN: [id number] Arrival date & time: 01/22/21  1630      History   Chief Complaint Chief Complaint  Patient presents with  . Breast Pain  . Rib Injury    HPI April Duke is a 75 y.o. female who presents with R rib pain under her breast after hitting it on a chair 2 weeks ago. Pain provoked with cough and breathing. She thought she would get better, but she is still hurting. Denies being SOB.      Past Medical History:  Diagnosis Date  . Atrial fibrillation (HCC)   . Hyperlipidemia   . Hypertension   . Osteoporosis   . Thyroid disease     Patient Active Problem List   Diagnosis Date Noted  . A-fib (HCC) 04/27/2019  . HTN (hypertension) 04/27/2019  . Hyperlipemia 04/27/2019  . Hypothyroid 04/27/2019  . Osteoporosis 04/27/2019  . Acute recurrent sinusitis 11/06/2014  . GERD (gastroesophageal reflux disease) 07/02/2014    History reviewed. No pertinent surgical history.  OB History   No obstetric history on file.      Home Medications    Prior to Admission medications   Medication Sig Start Date End Date Taking? Authorizing Provider  lidocaine (LIDODERM) 5 % Place 1 patch onto the skin daily. Remove & Discard patch within 12 hours or as directed by MD 01/22/21  Yes Rodriguez-Southworth, Nettie Elm, PA-C  amLODipine (NORVASC) 5 MG tablet Take 1 tablet (5 mg total) by mouth daily. 09/01/20   Dettinger, Elige Radon, MD  atenolol (TENORMIN) 50 MG tablet Take 1 tablet (50 mg total) by mouth daily. 12/31/20   Dettinger, Elige Radon, MD  calcium carbonate (OSCAL) 1500 (600 Ca) MG TABS tablet Take by mouth 2 (two) times daily with a meal.    [provider]  captopril (CAPOTEN) 25 MG tablet Take 1 tablet by mouth twice daily 09/08/20   Rollene Rotunda, MD  diclofenac Sodium (VOLTAREN) 1 % GEL Apply 2 g topically 4 (four) times daily. 12/31/20   Dettinger, Elige Radon, MD  Multiple Vitamin (MULTIVITAMIN) tablet Take 1 tablet by mouth daily.    [provider]  rosuvastatin (CRESTOR) 5 MG tablet Take 1 tablet by mouth once daily 09/16/20   Dettinger, Elige Radon, MD  SYNTHROID 50 MCG tablet Take 1 tablet by mouth once daily 10/15/20   Dettinger, Elige Radon, MD  XARELTO 20 MG TABS tablet Take 1 tablet (20 mg total) by mouth daily. 05/28/20   Rollene Rotunda, MD    Family History Family History  Problem Relation Age of Onset  . Cancer Mother        breast  . Hypertension Mother   . Hypertension Father     Social History Social History   Tobacco Use  . Smoking status: Never Smoker  . Smokeless tobacco: Never Used  Vaping Use  . Vaping Use: Never used  Substance Use Topics  . Alcohol use: Never  . Drug use: Never     Allergies   Sulfa antibiotics   Review of Systems Review of Systems  Constitutional: Negative for appetite change, chills, diaphoresis, fatigue and fever.  HENT: Negative for congestion, ear discharge, ear pain, postnasal drip and rhinorrhea.   Eyes: Negative for discharge.  Respiratory: Negative for cough and shortness of breath.   Gastrointestinal: Negative for nausea and vomiting.  Musculoskeletal: Negative for myalgias.  Skin: Negative for color change, rash and wound.     Physical Exam Triage Vital Signs  ED Triage Vitals [01/22/21 1718]  Enc Vitals Group     BP 137/78     Pulse Rate 73     Resp 17     Temp 97.8 F (36.6 C)     Temp Source Oral     SpO2 96 %     Weight      Height      Head Circumference      Peak Flow      Pain Score 5     Pain Loc      Pain Edu?      Excl. in GC?    No data found.  Updated Vital Signs BP 137/78 (BP Location: Right Arm)   Pulse 73   Temp 97.8 F (36.6 C) (Oral)   Resp 17   SpO2 96%   Visual Acuity Right Eye Distance:   Left Eye Distance:   Bilateral Distance:    Right Eye Near:   Left Eye Near:    Bilateral Near:     Physical Exam Vitals and nursing note reviewed.  Constitutional:      General: She is not in acute distress.     Appearance: She is normal weight. She is not toxic-appearing.  HENT:     Head: Normocephalic.     Right Ear: External ear normal.     Left Ear: External ear normal.  Eyes:     General: No scleral icterus.    Conjunctiva/sclera: Conjunctivae normal.  Cardiovascular:     Rate and Rhythm: Normal rate and regular rhythm.  Pulmonary:     Effort: Pulmonary effort is normal.     Breath sounds: Normal breath sounds.     Comments: On ribs under R breast.  Chest:     Chest wall: Tenderness present.  Breasts:     Right: Normal.      Comments: Has minimal tenderness on the lower area, but no ecchymosis or lumps palpated  Musculoskeletal:     Cervical back: Neck supple.  Neurological:     Mental Status: She is alert and oriented to person, place, and time.     Gait: Gait normal.  Psychiatric:        Mood and Affect: Mood normal.        Behavior: Behavior normal.        Thought Content: Thought content normal.        Judgment: Judgment normal.      UC Treatments / Results  Labs (all labs ordered are listed, but only abnormal results are displayed) Labs Reviewed - No data to display  EKG   Radiology DG Ribs Unilateral W/Chest Right  Result Date: 01/22/2021 CLINICAL DATA:  Right rib contusion, hit on chair 2 weeks ago EXAM: RIGHT RIBS AND CHEST - 3+ VIEW COMPARISON:  01/18/2011 FINDINGS: Frontal view of the chest as well as oblique views of the right thoracic cage are obtained. Cardiac silhouette is stable. No airspace disease, effusion, or pneumothorax. There is a minimally displaced fracture at the right anterior sixth rib costochondral junction. No other acute bony abnormalities. IMPRESSION: 1. Minimally displaced fracture right anterior sixth rib costochondral junction. 2. No effusion or pneumothorax. Electronically Signed   By: Sharlet Salina M.D.   On: 01/22/2021 17:54    Procedures Procedures (including critical care time)  Medications Ordered in UC Medications - No data to  display  Initial Impression / Assessment and Plan / UC Course  I have reviewed the triage vital signs and the nursing  notes. Pertinent imaging results that were available during my care of the patient were reviewed by me and considered in my medical decision making (see chart for details). Non displaced R 6th  rib fracture. I placed her on Lidoderm patches as noted and educated her how to use it. See instructions.  Final Clinical Impressions(s) / UC Diagnoses   Final diagnoses:  Closed fracture of one rib of right side, initial encounter     Discharge Instructions     Use  a pillow against your chest to cough or take deep breaths. Use the patches for pain as needed.     ED Prescriptions    Medication Sig Dispense Auth. Provider   lidocaine (LIDODERM) 5 % Place 1 patch onto the skin daily. Remove & Discard patch within 12 hours or as directed by MD 30 patch Rodriguez-Southworth, Nettie Elm, PA-C     I have reviewed the PDMP during this encounter.   Garey Ham, PA-C 01/23/21 1053

## 2021-01-22 NOTE — ED Triage Notes (Signed)
Hit her RT breast on a chair x 2 weeks ago.  Pt reports pain to that area with coughing and breathing

## 2021-01-22 NOTE — Discharge Instructions (Addendum)
Use  a pillow against your chest to cough or take deep breaths. Use the patches for pain as needed.

## 2021-01-30 ENCOUNTER — Ambulatory Visit (INDEPENDENT_AMBULATORY_CARE_PROVIDER_SITE_OTHER): Payer: Medicare Other

## 2021-01-30 VITALS — Ht 64.0 in | Wt 133.0 lb

## 2021-01-30 DIAGNOSIS — Z0001 Encounter for general adult medical examination with abnormal findings: Secondary | ICD-10-CM

## 2021-01-30 DIAGNOSIS — Z9181 History of falling: Secondary | ICD-10-CM | POA: Diagnosis not present

## 2021-01-30 DIAGNOSIS — M1611 Unilateral primary osteoarthritis, right hip: Secondary | ICD-10-CM | POA: Insufficient documentation

## 2021-01-30 DIAGNOSIS — Z7409 Other reduced mobility: Secondary | ICD-10-CM | POA: Diagnosis not present

## 2021-01-30 DIAGNOSIS — Z0000 Encounter for general adult medical examination without abnormal findings: Secondary | ICD-10-CM

## 2021-01-30 DIAGNOSIS — I4891 Unspecified atrial fibrillation: Secondary | ICD-10-CM | POA: Insufficient documentation

## 2021-01-30 NOTE — Progress Notes (Signed)
Subjective:   April Duke is a 75 y.o. female who presents for Medicare Annual (Subsequent) preventive examination.  Virtual Visit via Telephone Note  I connected with  April Duke on 01/30/21 at  9:00 AM EDT by telephone and verified that I am speaking with the correct person using two identifiers.  Location: Patient: Home Provider: [facility] Persons participating in the virtual visit: patient/Nurse Health Advisor   I discussed the limitations, risks, security and privacy concerns of performing an evaluation and management service by telephone and the availability of in person appointments. The patient expressed understanding and agreed to proceed.  Interactive audio and video telecommunications were attempted between this nurse and patient, however failed, due to patient having technical difficulties OR patient did not have access to video capability.  We continued and completed visit with audio only.  Some vital signs may be absent or patient reported.   April Cryder E Wolf Boulay, LPN   Review of Systems     Cardiac Risk Factors include: advanced age (>1655men, 66>65 women);dyslipidemia;hypertension     Objective:    Today's Vitals   01/30/21 0845  Weight: 133 lb (60.3 kg)  Height: 5\' 4"  (1.626 m)  PainSc: 3    Body mass index is 22.83 kg/m.  Advanced Directives 01/08/2020  Does Patient Have a Medical Advance Directive? No  Would patient like information on creating a medical advance directive? No - Patient declined    Current Medications (verified) Outpatient Encounter Medications as of 01/30/2021  Medication Sig  . amLODipine (NORVASC) 5 MG tablet Take 1 tablet (5 mg total) by mouth daily.  Marland Kitchen. atenolol (TENORMIN) 50 MG tablet Take 1 tablet (50 mg total) by mouth daily.  . calcium carbonate (OSCAL) 1500 (600 Ca) MG TABS tablet Take by mouth 2 (two) times daily with a meal.  . captopril (CAPOTEN) 25 MG tablet Take 1 tablet by mouth twice daily  . Multiple Vitamin (MULTIVITAMIN) tablet  Take 1 tablet by mouth daily.  . rosuvastatin (CRESTOR) 5 MG tablet Take 1 tablet by mouth once daily  . SYNTHROID 50 MCG tablet Take 1 tablet by mouth once daily  . XARELTO 20 MG TABS tablet Take 1 tablet (20 mg total) by mouth daily.  . diclofenac Sodium (VOLTAREN) 1 % GEL Apply 2 g topically 4 (four) times daily. (Patient not taking: Reported on 01/30/2021)  . lidocaine (LIDODERM) 5 % Place 1 patch onto the skin daily. Remove & Discard patch within 12 hours or as directed by MD (Patient not taking: Reported on 01/30/2021)   No facility-administered encounter medications on file as of 01/30/2021.    Allergies (verified) Sulfa antibiotics and Ethanol   History: Past Medical History:  Diagnosis Date  . Atrial fibrillation (HCC)   . Hyperlipidemia   . Hypertension   . Osteoporosis   . Thyroid disease    History reviewed. No pertinent surgical history. Family History  Problem Relation Age of Onset  . Cancer Mother        breast  . Hypertension Mother   . Hypertension Father    Social History   Socioeconomic History  . Marital status: Married    Spouse name: Not on file  . Number of children: 3  . Years of education: Not on file  . Highest education level: Not on file  Occupational History  . Occupation: retired  Tobacco Use  . Smoking status: Never Smoker  . Smokeless tobacco: Never Used  Vaping Use  . Vaping Use: Never used  Substance  and Sexual Activity  . Alcohol use: Never  . Drug use: Never  . Sexual activity: Not on file    Comment: married 56 years in 2020  Other Topics Concern  . Not on file  Social History Narrative   Lives with husband.  3 sons.     Social Determinants of Health   Financial Resource Strain: Low Risk   . Difficulty of Paying Living Expenses: Not hard at all  Food Insecurity: No Food Insecurity  . Worried About Programme researcher, broadcasting/film/video in the Last Year: Never true  . Ran Out of Food in the Last Year: Never true  Transportation Needs: No  Transportation Needs  . Lack of Transportation (Medical): No  . Lack of Transportation (Non-Medical): No  Physical Activity: Insufficiently Active  . Days of Exercise per Week: 5 days  . Minutes of Exercise per Session: 20 min  Stress: No Stress Concern Present  . Feeling of Stress : Only a little  Social Connections: Socially Integrated  . Frequency of Communication with Friends and Family: More than three times a week  . Frequency of Social Gatherings with Friends and Family: Twice a week  . Attends Religious Services: More than 4 times per year  . Active Member of Clubs or Organizations: Yes  . Attends Banker Meetings: More than 4 times per year  . Marital Status: Married    Tobacco Counseling Counseling given: Not Answered   Clinical Intake:  Pre-visit preparation completed: Yes  Pain : 0-10 Pain Score: 3  Pain Type: Chronic pain Pain Location: Finger (Comment which one) Pain Descriptors / Indicators: Aching Pain Onset: More than a month ago Pain Frequency: Intermittent     BMI - recorded: 22.83 Nutritional Status: BMI of 19-24  Normal Nutritional Risks: None Diabetes: No  How often do you need to have someone help you when you read instructions, pamphlets, or other written materials from your doctor or pharmacy?: 1 - Never  Diabetic? No  Interpreter Needed?: No  Information entered by :: Terryl Molinelli, LPN   Activities of Daily Living In your present state of health, do you have any difficulty performing the following activities: 01/30/2021  Hearing? N  Vision? N  Difficulty concentrating or making decisions? N  Walking or climbing stairs? N  Dressing or bathing? N  Doing errands, shopping? N  Preparing Food and eating ? N  Using the Toilet? N  In the past six months, have you accidently leaked urine? N  Do you have problems with loss of bowel control? N  Managing your Medications? N  Managing your Finances? N  Housekeeping or managing  your Housekeeping? N  Some recent data might be hidden    Patient Care Team: Dettinger, Elige Radon, MD as PCP - General (Family Medicine)  Indicate any recent Medical Services you may have received from other than [facility] providers in the past year (date may be approximate).     Assessment:   This is a routine wellness examination for April Duke.  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 LIFESTYLE - DECREASE FALLS RISK     She has noticed balance problems; would like a rolling walker    . Prevent falls      Depression Screen PHQ 2/9 Scores 01/30/2021 12/31/2020 09/01/2020 04/30/2020 01/08/2020 12/27/2019 08/29/2019  PHQ - 2 Score 0 0 0 0 0 0 0    Fall Risk Fall Risk  01/30/2021 12/31/2020 09/01/2020 04/30/2020 01/08/2020  Falls in the past year? 1 0 0 0 0  Number falls in past yr: 0 - - - -  Injury with Fall? 1 - - - -  Comment Fell in March 2022 - Fractured ribs - - - -  Risk for fall due to : Impaired balance/gait;Impaired vision;Orthopedic patient;History of fall(s) - - - -    FALL RISK PREVENTION PERTAINING TO THE HOME:  Any stairs in or around the home? No  If so, are there any without handrails? No  Home free of loose throw rugs in walkways, pet beds, electrical cords, etc? Yes  Adequate lighting in your home to reduce risk of falls? Yes   ASSISTIVE DEVICES UTILIZED TO PREVENT FALLS:  Life alert? No  Use of a cane, walker or w/c? Yes  Grab bars in the  bathroom? No  Shower chair or bench in shower? No  Elevated toilet seat or a handicapped toilet? No   TIMED UP AND GO:  Was the test performed? No . Telephonic visit.  Cognitive Function: Normal cognitive status assessed by direct observation by this Nurse Health Advisor. No abnormalities found.       6CIT Screen 01/08/2020  What Year? 0 points  What time? 0 points  Count back from 20 0 points  Repeat phrase 4 points    Immunizations Immunization History  Administered Date(s) Administered  . Tdap 04/16/2015    TDAP status: Up to date  Flu Vaccine status: Declined, Education has been provided regarding the importance of this vaccine but patient still declined. Advised may receive this vaccine at local pharmacy or Health Dept. Aware to provide a copy of the vaccination record if obtained from local pharmacy or Health Dept. Verbalized acceptance and understanding.  Pneumococcal vaccine status: Declined,  Education has been provided regarding the importance of this vaccine but patient still declined. Advised may receive this vaccine at local pharmacy or Health Dept. Aware to provide a copy of the vaccination record if obtained from local pharmacy or Health Dept. Verbalized acceptance and understanding.   Covid-19 vaccine status: Declined, Education has been provided regarding the importance of this vaccine but patient still declined. Advised may receive this vaccine at local pharmacy or Health Dept.or vaccine clinic. Aware to provide a copy of the vaccination record if obtained from local pharmacy or Health Dept. Verbalized acceptance and understanding.  Qualifies for Shingles Vaccine? Yes   Zostavax completed No   Shingrix Completed?: No.    Education has been provided regarding the importance of this vaccine. Patient has been advised to call insurance company to determine out of pocket expense if they have not yet received this vaccine. Advised may also receive vaccine at local  pharmacy or Health Dept. Verbalized acceptance and understanding.  Screening Tests Health Maintenance  Topic Date Due  . COVID-19 Vaccine (1) 04/30/2021 (Originally 01/26/1951)  . PNA vac Low Risk Adult (1 of 2 - PCV13) 09/01/2021 (Originally 01/26/2010)  . INFLUENZA VACCINE  05/25/2021  . TETANUS/TDAP  04/26/2028  . DEXA SCAN  Completed  . Hepatitis C Screening  Completed  . HPV VACCINES  Aged Out    Health Maintenance  There are no preventive care reminders to display for this patient.  Colorectal cancer screening: No longer required.   Mammogram status: No longer required due to age.  Bone Density status: Completed 12/27/2019. Results reflect: Bone density results: OSTEOPOROSIS. Repeat every 1-2 years.  Lung Cancer Screening: (Low Dose CT Chest recommended if Age 78-80 years, 30 pack-year currently smoking OR have quit w/in 15years.) does not qualify.   Additional Screening:  Hepatitis C Screening: does qualify; Completed 08/27/2020  Vision Screening: Recommended annual ophthalmology exams for early detection of glaucoma and other disorders of the eye. Is the patient up to date with their annual eye exam?  Yes  Who is the provider or what is the name of the office in which the patient attends annual eye exams? [facility] in [place] If pt is not established with a provider, would they like to be referred to a provider to establish care? No .   Dental Screening: Recommended annual dental exams for proper oral hygiene  Community Resource Referral / Chronic Care Management: CRR required this visit?  No   CCM required this visit?  No      Plan:     I have personally reviewed and noted the following in the patient's chart:   . Medical and social history . Use of alcohol, tobacco or illicit drugs  . Current medications and supplements . Functional ability and status . Nutritional status . Physical activity . Advanced directives . List of other physicians . Hospitalizations,  surgeries, and ER visits in previous 12 months . Vitals . Screenings to include cognitive, depression, and falls . Referrals and appointments  In addition, I have reviewed and discussed with patient certain preventive protocols, quality metrics, and best practice recommendations. A written personalized care plan for preventive services as well as general preventive health recommendations were provided to patient.     [place] Constable, LPN   01/25/5955   Nurse Notes: She has fallen once last month and has poor balance - uses a cane sometimes, but doesn't feel like it helps much. She would like a Rolling Walker if possible - wants to know if we can send in rx for this

## 2021-01-30 NOTE — Patient Instructions (Signed)
April Duke , Thank you for taking time to come for your Medicare Wellness Visit. I appreciate your ongoing commitment to your health goals. Please review the following plan we discussed and let me know if I can assist you in the future.   Screening recommendations/referrals: Colonoscopy: Cologuard done 05/07/2020 - No longer required due to age Mammogram: 2019? Unsure of last Mammogram date - Check with Dr Dettinger at next visit to see if he wants you to continue these. Bone Density: Done 12/27/19 - Repeat in 2 years Recommended yearly ophthalmology/optometry visit for glaucoma screening and checkup Recommended yearly dental visit for hygiene and checkup  Vaccinations: Influenza vaccine: Declined Pneumococcal vaccine: Declined Tdap vaccine: Done 04/16/2015 Shingles vaccine: Declined - Shingrix discussed. Please contact your pharmacy for coverage information.   Covid-19:Declined  Advanced directives: Advance directive discussed with you today. I have provided a copy for you to complete at home and have notarized. Once this is complete please bring a copy in to our office so we can scan it into your chart.  Conditions/risks identified: Continue fall prevention - Try the strength and balance exercises.  Next appointment: Follow up in one year for your annual wellness visit    Preventive Care 65 Years and Older, Female Preventive care refers to lifestyle choices and visits with your health care provider that can promote health and wellness. What does preventive care include?  A yearly physical exam. This is also called an annual well check.  Dental exams once or twice a year.  Routine eye exams. Ask your health care provider how often you should have your eyes checked.  Personal lifestyle choices, including:  Daily care of your teeth and gums.  Regular physical activity.  Eating a healthy diet.  Avoiding tobacco and drug use.  Limiting alcohol use.  Practicing safe sex.  Taking  low-dose aspirin every day.  Taking vitamin and mineral supplements as recommended by your health care provider. What happens during an annual well check? The services and screenings done by your health care provider during your annual well check will depend on your age, overall health, lifestyle risk factors, and family history of disease. Counseling  Your health care provider may ask you questions about your:  Alcohol use.  Tobacco use.  Drug use.  Emotional well-being.  Home and relationship well-being.  Sexual activity.  Eating habits.  History of falls.  Memory and ability to understand (cognition).  Work and work Astronomer.  Reproductive health. Screening  You may have the following tests or measurements:  Height, weight, and BMI.  Blood pressure.  Lipid and cholesterol levels. These may be checked every 5 years, or more frequently if you are over 70 years old.  Skin check.  Lung cancer screening. You may have this screening every year starting at age 55 if you have a 30-pack-year history of smoking and currently smoke or have quit within the past 15 years.  Fecal occult blood test (FOBT) of the stool. You may have this test every year starting at age 8.  Flexible sigmoidoscopy or colonoscopy. You may have a sigmoidoscopy every 5 years or a colonoscopy every 10 years starting at age 75.  Hepatitis C blood test.  Hepatitis B blood test.  Sexually transmitted disease (STD) testing.  Diabetes screening. This is done by checking your blood sugar (glucose) after you have not eaten for a while (fasting). You may have this done every 1-3 years.  Bone density scan. This is done to screen for osteoporosis.  You may have this done starting at age 58.  Mammogram. This may be done every 1-2 years. Talk to your health care provider about how often you should have regular mammograms. Talk with your health care provider about your test results, treatment options, and  if necessary, the need for more tests. Vaccines  Your health care provider may recommend certain vaccines, such as:  Influenza vaccine. This is recommended every year.  Tetanus, diphtheria, and acellular pertussis (Tdap, Td) vaccine. You may need a Td booster every 10 years.  Zoster vaccine. You may need this after age 70.  Pneumococcal 13-valent conjugate (PCV13) vaccine. One dose is recommended after age 70.  Pneumococcal polysaccharide (PPSV23) vaccine. One dose is recommended after age 16. Talk to your health care provider about which screenings and vaccines you need and how often you need them. This information is not intended to replace advice given to you by your health care provider. Make sure you discuss any questions you have with your health care provider. Document Released: 11/07/2015 Document Revised: 06/30/2016 Document Reviewed: 08/12/2015 Elsevier Interactive Patient Education  2017 [place] Prevention in the Home Falls can cause injuries. They can happen to people of all ages. There are many things you can do to make your home safe and to help prevent falls. What can I do on the outside of my home?  Regularly fix the edges of walkways and driveways and fix any cracks.  Remove anything that might make you trip as you walk through a door, such as a raised step or threshold.  Trim any bushes or trees on the path to your home.  Use bright outdoor lighting.  Clear any walking paths of anything that might make someone trip, such as rocks or tools.  Regularly check to see if handrails are loose or broken. Make sure that both sides of any steps have handrails.  Any raised decks and porches should have guardrails on the edges.  Have any leaves, snow, or ice cleared regularly.  Use sand or salt on walking paths during winter.  Clean up any spills in your garage right away. This includes oil or grease spills. What can I do in the bathroom?  Use night  lights.  Install grab bars by the toilet and in the tub and shower. Do not use towel bars as grab bars.  Use non-skid mats or decals in the tub or shower.  If you need to sit down in the shower, use a plastic, non-slip stool.  Keep the floor dry. Clean up any water that spills on the floor as soon as it happens.  Remove soap buildup in the tub or shower regularly.  Attach bath mats securely with double-sided non-slip rug tape.  Do not have throw rugs and other things on the floor that can make you trip. What can I do in the bedroom?  Use night lights.  Make sure that you have a light by your bed that is easy to reach.  Do not use any sheets or blankets that are too big for your bed. They should not hang down onto the floor.  Have a firm chair that has side arms. You can use this for support while you get dressed.  Do not have throw rugs and other things on the floor that can make you trip. What can I do in the kitchen?  Clean up any spills right away.  Avoid walking on wet floors.  Keep items that you use a  lot in easy-to-reach places.  If you need to reach something above you, use a strong step stool that has a grab bar.  Keep electrical cords out of the way.  Do not use floor polish or wax that makes floors slippery. If you must use wax, use non-skid floor wax.  Do not have throw rugs and other things on the floor that can make you trip. What can I do with my stairs?  Do not leave any items on the stairs.  Make sure that there are handrails on both sides of the stairs and use them. Fix handrails that are broken or loose. Make sure that handrails are as long as the stairways.  Check any carpeting to make sure that it is firmly attached to the stairs. Fix any carpet that is loose or worn.  Avoid having throw rugs at the top or bottom of the stairs. If you do have throw rugs, attach them to the floor with carpet tape.  Make sure that you have a light switch at the  top of the stairs and the bottom of the stairs. If you do not have them, ask someone to add them for you. What else can I do to help prevent falls?  Wear shoes that:  Do not have high heels.  Have rubber bottoms.  Are comfortable and fit you well.  Are closed at the toe. Do not wear sandals.  If you use a stepladder:  Make sure that it is fully opened. Do not climb a closed stepladder.  Make sure that both sides of the stepladder are locked into place.  Ask someone to hold it for you, if possible.  Clearly mark and make sure that you can see:  Any grab bars or handrails.  First and last steps.  Where the edge of each step is.  Use tools that help you move around (mobility aids) if they are needed. These include:  Canes.  Walkers.  Scooters.  Crutches.  Turn on the lights when you go into a dark area. Replace any light bulbs as soon as they burn out.  Set up your furniture so you have a clear path. Avoid moving your furniture around.  If any of your floors are uneven, fix them.  If there are any pets around you, be aware of where they are.  Review your medicines with your doctor. Some medicines can make you feel dizzy. This can increase your chance of falling. Ask your doctor what other things that you can do to help prevent falls. This information is not intended to replace advice given to you by your health care provider. Make sure you discuss any questions you have with your health care provider. Document Released: 08/07/2009 Document Revised: 03/18/2016 Document Reviewed: 11/15/2014 Elsevier Interactive Patient Education  2017 [employer].

## 2021-02-06 ENCOUNTER — Other Ambulatory Visit: Payer: Self-pay | Admitting: Family Medicine

## 2021-03-07 ENCOUNTER — Other Ambulatory Visit: Payer: Self-pay

## 2021-03-07 ENCOUNTER — Ambulatory Visit (INDEPENDENT_AMBULATORY_CARE_PROVIDER_SITE_OTHER): Payer: Medicare Other

## 2021-03-07 ENCOUNTER — Ambulatory Visit
Admission: EM | Admit: 2021-03-07 | Discharge: 2021-03-07 | Disposition: A | Discharge status: Home / Self Care | Payer: Medicare Other | Attending: Family Medicine | Admitting: Family Medicine

## 2021-03-07 ENCOUNTER — Encounter: Payer: Self-pay | Admitting: Emergency Medicine

## 2021-03-07 DIAGNOSIS — M2560 Stiffness of unspecified joint, not elsewhere classified: Secondary | ICD-10-CM

## 2021-03-07 DIAGNOSIS — M1612 Unilateral primary osteoarthritis, left hip: Secondary | ICD-10-CM

## 2021-03-07 DIAGNOSIS — M25552 Pain in left hip: Secondary | ICD-10-CM

## 2021-03-07 DIAGNOSIS — M25551 Pain in right hip: Secondary | ICD-10-CM | POA: Diagnosis not present

## 2021-03-07 DIAGNOSIS — R52 Pain, unspecified: Secondary | ICD-10-CM

## 2021-03-07 DIAGNOSIS — G8929 Other chronic pain: Secondary | ICD-10-CM

## 2021-03-07 NOTE — ED Triage Notes (Signed)
Bilateral hip pain that radiates down both legs x 1 month.  States left side hurts worse.

## 2021-03-07 NOTE — ED Provider Notes (Signed)
[facility]    CSN: [id number] Arrival date & time: 03/07/21  1417      History   Chief Complaint No chief complaint on file.   HPI April Duke is a 75 y.o. female.   Reports bilateral hip pain for the last month.  Reports that it radiates down both legs.  States that the left causes more pain on the right side. Has been taking tylenol with little relief. States that pain is worse with activity.  Denies previous symptoms.  Denies numbness, tingling, back pain, loss of bowel or bladder control, radiating pain, other symptoms.  ROS per HPI  The history is provided by the patient.    Past Medical History:  Diagnosis Date  . Atrial fibrillation (HCC)   . Hyperlipidemia   . Hypertension   . Osteoporosis   . Thyroid disease     Patient Active Problem List   Diagnosis Date Noted  . Atrial fibrillation (HCC) 01/30/2021  . Arthritis of right hip 01/30/2021  . A-fib (HCC) 04/27/2019  . HTN (hypertension) 04/27/2019  . Hyperlipemia 04/27/2019  . Hypothyroid 04/27/2019  . Osteoporosis 04/27/2019  . Acute recurrent sinusitis 11/06/2014  . GERD (gastroesophageal reflux disease) 07/02/2014    History reviewed. No pertinent surgical history.  OB History   No obstetric history on file.      Home Medications    Prior to Admission medications   Medication Sig Start Date End Date Taking? Authorizing Provider  amLODipine (NORVASC) 5 MG tablet Take 1 tablet (5 mg total) by mouth daily. 09/01/20   Dettinger, Elige Radon, MD  atenolol (TENORMIN) 50 MG tablet Take 1 tablet (50 mg total) by mouth daily. 12/31/20   Dettinger, Elige Radon, MD  calcium carbonate (OSCAL) 1500 (600 Ca) MG TABS tablet Take by mouth 2 (two) times daily with a meal.    [provider]  captopril (CAPOTEN) 25 MG tablet Take 1 tablet by mouth twice daily 09/08/20   Rollene Rotunda, MD  diclofenac Sodium (VOLTAREN) 1 % GEL Apply 2 g topically 4 (four) times daily. Patient not taking: Reported on  01/30/2021 12/31/20   Dettinger, Elige Radon, MD  lidocaine (LIDODERM) 5 % Place 1 patch onto the skin daily. Remove & Discard patch within 12 hours or as directed by MD Patient not taking: Reported on 01/30/2021 01/22/21   Rodriguez-Southworth, Nettie Elm, PA-C  Multiple Vitamin (MULTIVITAMIN) tablet Take 1 tablet by mouth daily.    [provider]  rosuvastatin (CRESTOR) 5 MG tablet Take 1 tablet by mouth once daily 09/16/20   Dettinger, Elige Radon, MD  SYNTHROID 50 MCG tablet Take 1 tablet by mouth once daily 10/15/20   Dettinger, Elige Radon, MD  XARELTO 20 MG TABS tablet Take 1 tablet (20 mg total) by mouth daily. 05/28/20   Rollene Rotunda, MD    Family History Family History  Problem Relation Age of Onset  . Cancer Mother        breast  . Hypertension Mother   . Hypertension Father     Social History Social History   Tobacco Use  . Smoking status: Never Smoker  . Smokeless tobacco: Never Used  Vaping Use  . Vaping Use: Never used  Substance Use Topics  . Alcohol use: Never  . Drug use: Never     Allergies   Sulfa antibiotics and Ethanol   Review of Systems Review of Systems   Physical Exam Triage Vital Signs ED Triage Vitals  Enc Vitals Group  BP 03/07/21 1426 (!) 148/79     Pulse Rate 03/07/21 1426 (!) 51     Resp 03/07/21 1426 18     Temp 03/07/21 1426 97.9 F (36.6 C)     Temp Source 03/07/21 1426 Oral     SpO2 03/07/21 1426 93 %     Weight --      Height --      Head Circumference --      Peak Flow --      Pain Score 03/07/21 1428 8     Pain Loc --      Pain Edu? --      Excl. in GC? --    No data found.  Updated Vital Signs BP (!) 148/79 (BP Location: Right Arm)   Pulse (!) 51   Temp 97.9 F (36.6 C) (Oral)   Resp 18   SpO2 93%       Physical Exam Vitals and nursing note reviewed.  Constitutional:      General: She is not in acute distress.    Appearance: Normal appearance. She is well-developed. She is not ill-appearing.  HENT:      Head: Normocephalic and atraumatic.     Nose: Nose normal.     Mouth/Throat:     Mouth: Mucous membranes are moist.     Pharynx: Oropharynx is clear.  Eyes:     Extraocular Movements: Extraocular movements intact.     Conjunctiva/sclera: Conjunctivae normal.     Pupils: Pupils are equal, round, and reactive to light.  Cardiovascular:     Rate and Rhythm: Normal rate and regular rhythm.  Pulmonary:     Effort: Pulmonary effort is normal.  Musculoskeletal:        General: Tenderness present.     Cervical back: Normal range of motion and neck supple.     Comments: Left hip, greater trochanter  Skin:    General: Skin is warm and dry.     Capillary Refill: Capillary refill takes less than 2 seconds.  Neurological:     General: No focal deficit present.     Mental Status: She is alert and oriented to person, place, and time.  Psychiatric:        Mood and Affect: Mood normal.        Behavior: Behavior normal.        Thought Content: Thought content normal.      UC Treatments / Results  Labs (all labs ordered are listed, but only abnormal results are displayed) Labs Reviewed - No data to display  EKG   Radiology DG Hip Unilat With Pelvis 2-3 Views Left  Result Date: 03/07/2021 CLINICAL DATA:  Chronic BILATERAL hip pain, LEFT greater than RIGHT. No known injuries. EXAM: DG HIP (WITH OR WITHOUT PELVIS) 2-3V LEFT COMPARISON:  AP pelvis and RIGHT hip x-rays 01/20/2020. No prior LEFT hip x-rays. FINDINGS: Complete loss of the joint space in the LEFT hip with associated degenerative sclerosis involving the roof of the acetabulum and femoral head. Mild irregularity in the femoral head. Mild osseous demineralization. Findings have progressed since the March, 2021 AP pelvis examination. Complete loss of the joint space in the contralateral RIGHT hip with associated degenerative sclerosis involving the roof of the acetabulum and the femoral head, also progressive since the prior examination.  Bone island in the RIGHT femoral neck. Sacroiliac joints anatomically aligned with mild degenerative changes. Symphysis pubis intact. Degenerative changes involving the visualized lower lumbar spine. 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 Electronically Signed   By: Hulan Saas M.D.   On: 03/07/2021 15:24    Procedures Procedures (including critical care time)  Medications Ordered in UC Medications - No data to display  Initial Impression / Assessment and Plan / UC Course  I have reviewed the triage vital signs and the nursing notes.  Pertinent labs & imaging results that were available during my care of the patient were reviewed by me and considered in my medical decision making (see chart for details).    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." May continue taking Tylenol May use ice or heat to the area as needed May use diclofenac gel to the area as needed Discussed that she may need hip replacement in the future Follow-up with orthopedics Follow-up in the ER for unrelenting pain, high fever, trouble swallowing, trouble breathing, other concerning symptoms   Final Clinical Impressions(s) / UC Diagnoses   Final diagnoses:  Pain  Limited joint range of motion (ROM)  Left hip pain  Primary osteoarthritis of left hip     Discharge Instructions     Xray today shows complete loss of cartilage in the left hip  Follow up with orthopedics for further management  Follow up with this office or with primary care if symptoms are persisting.  Follow up in the ER for high fever, trouble swallowing, trouble breathing, other concerning symptoms.     ED Prescriptions    None     PDMP not reviewed this encounter.   Moshe Cipro, NP 03/07/21 1546

## 2021-03-07 NOTE — Discharge Instructions (Addendum)
Xray today shows complete loss of cartilage in the left hip  Follow up with orthopedics for further management  Follow up with this office or with primary care if symptoms are persisting.  Follow up in the ER for high fever, trouble swallowing, trouble breathing, other concerning symptoms.

## 2021-04-23 ENCOUNTER — Ambulatory Visit (INDEPENDENT_AMBULATORY_CARE_PROVIDER_SITE_OTHER): Payer: Medicare Other | Admitting: Orthopedic Surgery

## 2021-04-23 ENCOUNTER — Ambulatory Visit: Payer: Medicare Other

## 2021-04-23 ENCOUNTER — Other Ambulatory Visit: Payer: Self-pay

## 2021-04-23 ENCOUNTER — Encounter: Payer: Self-pay | Admitting: Orthopedic Surgery

## 2021-04-23 VITALS — BP 160/76 | HR 60 | Ht 64.0 in | Wt 134.0 lb

## 2021-04-23 DIAGNOSIS — M419 Scoliosis, unspecified: Secondary | ICD-10-CM | POA: Diagnosis not present

## 2021-04-23 DIAGNOSIS — M25552 Pain in left hip: Secondary | ICD-10-CM

## 2021-04-23 DIAGNOSIS — M1612 Unilateral primary osteoarthritis, left hip: Secondary | ICD-10-CM

## 2021-04-23 DIAGNOSIS — M545 Low back pain, unspecified: Secondary | ICD-10-CM

## 2021-04-23 DIAGNOSIS — M1610 Unilateral primary osteoarthritis, unspecified hip: Secondary | ICD-10-CM

## 2021-04-23 DIAGNOSIS — M1611 Unilateral primary osteoarthritis, right hip: Secondary | ICD-10-CM

## 2021-04-23 NOTE — Progress Notes (Signed)
NEW PROBLEM//OFFICE VISIT  Summary assessment and plan:   75 year old female with bilateral arthritis of the hip and scoliosis and degenerative disc disease lumbar spine history of A. fib on Xarelto has severe disease in both hips.  She may be a candidate for bilateral hip arthroplasty if cardiology clears her.  She will see the cardiologist in August and let us know if we need to refer her for hip replacement  Chief Complaint  Patient presents with   Back Pain    States back painful for years    Leg Pain    Pain down left leg for about a year    75 year old female with bilateral hip pain rating to her left and right knee and some lower back pain with difficulty walking.  Gets some relief with Tylenol.  Does not have real groin pain but has some lateral iliac crest pain    MEDICAL DECISION MAKING  A.  Encounter Diagnosis  Name Primary?   Lumbar pain Yes    B. DATA ANALYSED:   IMAGING: Interpretation of images: Internal images of the lumbar spine show scoliosis and degenerative disc disease pelvic obliquity  She also had an AP pelvis which shows severe disease both hips with end-stage disease  Orders: no  Outside records reviewed: no   C. MANAGEMENT     No orders of the defined types were placed in this encounter.    BP (!) 160/76   Pulse 60   Ht 5\' 4"  (1.626 m)   Wt 134 lb (60.8 kg)   BMI 23.00 kg/m    General appearance: Well-developed well-nourished no gross deformities  Cardiovascular normal pulse and perfusion normal color without edema  Neurologically no sensation loss or deficits or pathologic reflexes  Psychological: Awake alert and oriented x3 mood and affect normal  Skin no lacerations or ulcerations no nodularity no palpable masses, no erythema or nodularity  Musculoskeletal: Lower back tenderness on the left side none in the middle none on the right surprisingly stiff hips especially hip flexion and internal rotation bilaterally were  diminished and did cause some discomfort   Review of Systems  Constitutional:  Negative for fever.  Respiratory:  Negative for shortness of breath.   Cardiovascular:  Negative for chest pain.  Skin: Negative.   Neurological:  Negative for tingling and sensory change.    Past Medical History:  Diagnosis Date   Atrial fibrillation (HCC)    Hyperlipidemia    Hypertension    Osteoporosis    Thyroid disease     History reviewed. No pertinent surgical history.  Family History  Problem Relation Age of Onset   Cancer Mother        breast   Hypertension Mother    Hypertension Father    Social History   Tobacco Use   Smoking status: Never   Smokeless tobacco: Never  Vaping Use   Vaping Use: Never used  Substance Use Topics   Alcohol use: Never   Drug use: Never    Allergies  Allergen Reactions   Sulfa Antibiotics Rash   Ethanol     Other reaction(s): RAPID HEARTRATE     No outpatient medications have been marked as taking for the 04/23/21 encounter (Office Visit) with 04/25/21, MD.        Vickki Hearing, MD  04/23/2021 3:05 PM

## 2021-04-23 NOTE — Patient Instructions (Signed)
Call us after seeing the cardiologist if you want to have surgery on your hips

## 2021-04-29 ENCOUNTER — Other Ambulatory Visit: Payer: Medicare Other

## 2021-04-29 ENCOUNTER — Other Ambulatory Visit: Payer: Self-pay

## 2021-04-29 DIAGNOSIS — I10 Essential (primary) hypertension: Secondary | ICD-10-CM

## 2021-04-29 DIAGNOSIS — K219 Gastro-esophageal reflux disease without esophagitis: Secondary | ICD-10-CM

## 2021-04-29 DIAGNOSIS — E039 Hypothyroidism, unspecified: Secondary | ICD-10-CM

## 2021-04-30 LAB — CMP14+EGFR
ALT: 12 IU/L (ref 0–32)
AST: 19 IU/L (ref 0–40)
Albumin/Globulin Ratio: 1.8 (ref 1.2–2.2)
Albumin: 4.9 g/dL — ABNORMAL HIGH (ref 3.7–4.7)
Alkaline Phosphatase: 77 IU/L (ref 44–121)
BUN/Creatinine Ratio: 13 (ref 12–28)
BUN: 9 mg/dL (ref 8–27)
Bilirubin Total: 0.4 mg/dL (ref 0.0–1.2)
CO2: 24 mmol/L (ref 20–29)
Calcium: 9.9 mg/dL (ref 8.7–10.3)
Chloride: 98 mmol/L (ref 96–106)
Creatinine, Ser: 0.72 mg/dL (ref 0.57–1.00)
Globulin, Total: 2.8 g/dL (ref 1.5–4.5)
Glucose: 109 mg/dL — ABNORMAL HIGH (ref 65–99)
Potassium: 4.3 mmol/L (ref 3.5–5.2)
Sodium: 137 mmol/L (ref 134–144)
Total Protein: 7.7 g/dL (ref 6.0–8.5)
eGFR: 87 mL/min/{1.73_m2} (ref >59)

## 2021-04-30 LAB — CBC WITH DIFFERENTIAL/PLATELET
Basophils Absolute: 0 10*3/uL (ref 0.0–0.2)
Basos: 1 %
EOS (ABSOLUTE): 0.2 10*3/uL (ref 0.0–0.4)
Eos: 5 %
Hematocrit: 39.6 % (ref 34.0–46.6)
Hemoglobin: 13.4 g/dL (ref 11.1–15.9)
Immature Grans (Abs): 0 10*3/uL (ref 0.0–0.1)
Immature Granulocytes: 0 %
Lymphocytes Absolute: 1.7 10*3/uL (ref 0.7–3.1)
Lymphs: 37 %
MCH: 30.2 pg (ref 26.6–33.0)
MCHC: 33.8 g/dL (ref 31.5–35.7)
MCV: 89 fL (ref 79–97)
Monocytes Absolute: 0.6 10*3/uL (ref 0.1–0.9)
Monocytes: 13 %
Neutrophils Absolute: 2 10*3/uL (ref 1.4–7.0)
Neutrophils: 44 %
Platelets: 231 10*3/uL (ref 150–450)
RBC: 4.43 x10E6/uL (ref 3.77–5.28)
RDW: 13.1 % (ref 11.7–15.4)
WBC: 4.6 10*3/uL (ref 3.4–10.8)

## 2021-04-30 LAB — TSH: TSH: 1.92 u[IU]/mL (ref 0.450–4.500)

## 2021-05-04 ENCOUNTER — Other Ambulatory Visit: Payer: Self-pay

## 2021-05-04 ENCOUNTER — Encounter: Payer: Self-pay | Admitting: Family Medicine

## 2021-05-04 ENCOUNTER — Ambulatory Visit (INDEPENDENT_AMBULATORY_CARE_PROVIDER_SITE_OTHER): Payer: Medicare Other | Admitting: Family Medicine

## 2021-05-04 VITALS — BP 163/72 | HR 55 | Ht 64.0 in | Wt 134.0 lb

## 2021-05-04 DIAGNOSIS — E039 Hypothyroidism, unspecified: Secondary | ICD-10-CM

## 2021-05-04 DIAGNOSIS — E782 Mixed hyperlipidemia: Secondary | ICD-10-CM

## 2021-05-04 DIAGNOSIS — I10 Essential (primary) hypertension: Secondary | ICD-10-CM | POA: Diagnosis not present

## 2021-05-04 DIAGNOSIS — M1611 Unilateral primary osteoarthritis, right hip: Secondary | ICD-10-CM

## 2021-05-04 DIAGNOSIS — I480 Paroxysmal atrial fibrillation: Secondary | ICD-10-CM | POA: Diagnosis not present

## 2021-05-04 MED ORDER — SYNTHROID 50 MCG PO TABS: 50.0000 ug | ORAL_TABLET | Freq: Every day | ORAL | 3 refills | Status: DC

## 2021-05-04 MED ORDER — AMLODIPINE BESYLATE 5 MG PO TABS: 5.0000 mg | ORAL_TABLET | Freq: Every day | ORAL | 3 refills | Status: DC

## 2021-05-04 MED ORDER — BIOFREEZE 4 % EX GEL: 1.0000 "application " | Freq: Two times a day (BID) | CUTANEOUS | 11 refills | Status: DC | PRN

## 2021-05-04 MED ORDER — ROSUVASTATIN CALCIUM 5 MG PO TABS: 5.0000 mg | ORAL_TABLET | Freq: Every day | ORAL | 3 refills | Status: DC

## 2021-05-04 NOTE — Progress Notes (Signed)
BP (!) 163/72   Pulse (!) 55   Ht '5\' 4"'  (1.626 m)   Wt 134 lb (60.8 kg)   SpO2 98%   BMI 23.00 kg/m    Subjective:   Patient ID: April Duke, female    DOB: 05/15/1946, 75 y.o.   MRN: [id number]  HPI: April Duke is a 75 y.o. female presenting on 05/04/2021 for Medical Management of Chronic Issues, Hypothyroidism, and Hypertension   HPI Hypothyroidism recheck Patient is coming in for thyroid recheck today as well. They deny any issues with hair changes or heat or cold problems or diarrhea or constipation. They deny any chest pain or palpitations. They are currently on levothyroxine 82mcrograms   Hypertension Patient is currently on atenolol and captopril and amlodipine, and their blood pressure today is 163/72. Patient denies any lightheadedness or dizziness. Patient denies headaches, blurred vision, chest pains, shortness of breath, or weakness. Denies any side effects from medication and is content with current medication.   Hyperlipidemia Patient is coming in for recheck of his hyperlipidemia. The patient is currently taking Crestor. They deny any issues with myalgias or history of liver damage from it. They deny any focal numbness or weakness or chest pain.   Chronic A. fib recheck History of Xarelto for chronic A. fib.  Patient denies any bruising or bleeding.  She does say that she gets weak sometimes when her heart skips sometimes and she is feeling that way little bit today.  She denies any chest pains or shortness of breath.  Relevant past medical, surgical, family and social history reviewed and updated as indicated. Interim medical history since our last visit reviewed. Allergies and medications reviewed and updated.  Review of Systems  Constitutional:  Negative for chills and fever.  Eyes:  Negative for visual disturbance.  Respiratory:  Negative for chest tightness and shortness of breath.   Cardiovascular:  Negative for chest pain and leg swelling.   Musculoskeletal:  Negative for back pain and gait problem.  Skin:  Negative for rash.  Neurological:  Negative for light-headedness and headaches.  Psychiatric/Behavioral:  Negative for agitation and behavioral problems.   All other systems reviewed and are negative.  Per HPI unless specifically indicated above   Allergies as of 05/04/2021       Reactions   Sulfa Antibiotics Rash   Ethanol    Other reaction(s): RAPID HEARTRATE         Medication List        Accurate as of May 04, 2021  8:24 AM. If you have any questions, ask your nurse or doctor.          amLODipine 5 MG tablet Commonly known as: NORVASC Take 1 tablet (5 mg total) by mouth daily.   atenolol 50 MG tablet Commonly known as: TENORMIN Take 1 tablet (50 mg total) by mouth daily.   Biofreeze 4 % Gel Generic drug: Menthol (Topical Analgesic) Apply 1 application topically 2 (two) times daily as needed. Started by: JFransisca KaufmannDettinger, MD   calcium carbonate 1500 (600 Ca) MG Tabs tablet Commonly known as: OSCAL Take by mouth 2 (two) times daily with a meal.   captopril 25 MG tablet Commonly known as: CAPOTEN Take 1 tablet by mouth twice daily   multivitamin tablet Take 1 tablet by mouth daily.   rosuvastatin 5 MG tablet Commonly known as: CRESTOR Take 1 tablet (5 mg total) by mouth daily.   Synthroid 50 MCG tablet Generic drug: levothyroxine Take 1 tablet (50  mcg total) by mouth daily. What changed: how much to take Changed by: Fransisca Kaufmann Fleming Prill, MD   Xarelto 20 MG Tabs tablet Generic drug: rivaroxaban Take 1 tablet (20 mg total) by mouth daily.         Objective:   BP (!) 163/72   Pulse (!) 55   Ht '5\' 4"'  (1.626 m)   Wt 134 lb (60.8 kg)   SpO2 98%   BMI 23.00 kg/m   Wt Readings from Last 3 Encounters:  05/04/21 134 lb (60.8 kg)  04/23/21 134 lb (60.8 kg)  01/30/21 133 lb (60.3 kg)    Physical Exam Vitals and nursing note reviewed.  Constitutional:      General: She is  not in acute distress.    Appearance: She is well-developed. She is not diaphoretic.  Eyes:     Conjunctiva/sclera: Conjunctivae normal.  Cardiovascular:     Rate and Rhythm: Normal rate. Rhythm irregular.     Heart sounds: Normal heart sounds. No murmur heard. Pulmonary:     Effort: Pulmonary effort is normal. No respiratory distress.     Breath sounds: Normal breath sounds. No wheezing.  Musculoskeletal:        General: No tenderness. Normal range of motion.  Skin:    General: Skin is warm and dry.     Findings: No rash.  Neurological:     Mental Status: She is alert and oriented to person, place, and time.     Coordination: Coordination normal.  Psychiatric:        Behavior: Behavior normal.    Results for orders placed or performed in visit on 04/29/21  TSH  Result Value Ref Range   TSH 1.920 0.450 - 4.500 uIU/mL  CMP14+EGFR  Result Value Ref Range   Glucose 109 (H) 65 - 99 mg/dL   BUN 9 8 - 27 mg/dL   Creatinine, Ser 0.72 0.57 - 1.00 mg/dL   eGFR 87 >59 mL/min/1.73   BUN/Creatinine Ratio 13 12 - 28   Sodium 137 134 - 144 mmol/L   Potassium 4.3 3.5 - 5.2 mmol/L   Chloride 98 96 - 106 mmol/L   CO2 24 20 - 29 mmol/L   Calcium 9.9 8.7 - 10.3 mg/dL   Total Protein 7.7 6.0 - 8.5 g/dL   Albumin 4.9 (H) 3.7 - 4.7 g/dL   Globulin, Total 2.8 1.5 - 4.5 g/dL   Albumin/Globulin Ratio 1.8 1.2 - 2.2   Bilirubin Total 0.4 0.0 - 1.2 mg/dL   Alkaline Phosphatase 77 44 - 121 IU/L   AST 19 0 - 40 IU/L   ALT 12 0 - 32 IU/L  CBC with Differential/Platelet  Result Value Ref Range   WBC 4.6 3.4 - 10.8 x10E3/uL   RBC 4.43 3.77 - 5.28 x10E6/uL   Hemoglobin 13.4 11.1 - 15.9 g/dL   Hematocrit 39.6 34.0 - 46.6 %   MCV 89 79 - 97 fL   MCH 30.2 26.6 - 33.0 pg   MCHC 33.8 31.5 - 35.7 g/dL   RDW 13.1 11.7 - 15.4 %   Platelets 231 150 - 450 x10E3/uL   Neutrophils 44 Not Estab. %   Lymphs 37 Not Estab. %   Monocytes 13 Not Estab. %   Eos 5 Not Estab. %   Basos 1 Not Estab. %    Neutrophils Absolute 2.0 1.4 - 7.0 x10E3/uL   Lymphocytes Absolute 1.7 0.7 - 3.1 x10E3/uL   Monocytes Absolute 0.6 0.1 - 0.9 x10E3/uL   EOS (  ABSOLUTE) 0.2 0.0 - 0.4 x10E3/uL   Basophils Absolute 0.0 0.0 - 0.2 x10E3/uL   Immature Granulocytes 0 Not Estab. %   Immature Grans (Abs) 0.0 0.0 - 0.1 x10E3/uL    Assessment & Plan:   Problem List Items Addressed This Visit       Cardiovascular and Mediastinum   A-fib (HCC)   Relevant Medications   rosuvastatin (CRESTOR) 5 MG tablet   amLODipine (NORVASC) 5 MG tablet   HTN (hypertension) - Primary   Relevant Medications   rosuvastatin (CRESTOR) 5 MG tablet   amLODipine (NORVASC) 5 MG tablet   Other Relevant Orders   Bayer DCA Hb A1c Waived   CBC with Differential/Platelet   CMP14+EGFR     Endocrine   Hypothyroid   Relevant Medications   SYNTHROID 50 MCG tablet   Other Relevant Orders   TSH     Musculoskeletal and Integument   Arthritis of right hip   Relevant Medications   Menthol, Topical Analgesic, (BIOFREEZE) 4 % GEL     Other   Hyperlipemia   Relevant Medications   rosuvastatin (CRESTOR) 5 MG tablet   amLODipine (NORVASC) 5 MG tablet   Other Relevant Orders   Lipid panel    Sent Biofreeze for the patient to try to help with her hip arthritis and legs.  Continue current medication. BP slightly elevated but patient says at home it always runs in the 120s and 130s.  She will monitor closely at home. Follow up plan: Return in about 4 months (around 09/04/2021), or if symptoms worsen or fail to improve, for Thyroid and hypertension cholesterol.  Counseling provided for all of the vaccine components Orders Placed This Encounter  Procedures   Bayer DCA Hb A1c Waived   CBC with Differential/Platelet   CMP14+EGFR   Lipid panel   TSH     Caryl Pina, MD [facility] 05/04/2021, 8:24 AM

## 2021-06-01 ENCOUNTER — Other Ambulatory Visit: Payer: Self-pay | Admitting: Cardiology

## 2021-06-01 NOTE — Telephone Encounter (Signed)
Prescription refill request for Xarelto received.  Indication:atrial fib Last office visit:upcoming Weight:60.8 kg Age:75 Scr:0.7 CrCl:65.63 ml/min  Prescription refilled

## 2021-06-01 NOTE — Progress Notes (Signed)
Cardiology Office Note   Date:  06/03/2021   ID:  April Duke, DOB [birth year], MRN [id number]  PCP:  Dettinger, Elige Radon, MD  Cardiologist:   None Referring:  Dettinger, Elige Radon, MD  Chief Complaint  Patient presents with   Atrial Fibrillation       History of Present Illness: April Duke is a 75 y.o. female who is referred by Dettinger, Elige Radon, MD for evaluation of atrial fib.    She was previously seen by Dr. Billey Chang at [place].   The patient has been on anticoagulation for several years.  She has had cardioversion in the past.    Since I last saw her she has done well.  The patient denies any new symptoms such as chest discomfort, neck or arm discomfort. There has been no new shortness of breath, PND or orthopnea. There have been no reported palpitations, presyncope or syncope.  He does not really feel the palpitations.  She tolerates her anticoagulation.    Past Medical History:  Diagnosis Date   Atrial fibrillation (HCC)    Hyperlipidemia    Hypertension    Osteoporosis    Thyroid disease     History reviewed. No pertinent surgical history.   Current Outpatient Medications  Medication Sig Dispense Refill   amLODipine (NORVASC) 5 MG tablet Take 1 tablet (5 mg total) by mouth daily. 90 tablet 3   atenolol (TENORMIN) 50 MG tablet Take 1 tablet (50 mg total) by mouth daily. 90 tablet 3   calcium carbonate (OSCAL) 1500 (600 Ca) MG TABS tablet Take by mouth 2 (two) times daily with a meal.     captopril (CAPOTEN) 25 MG tablet Take 1 tablet by mouth twice daily 60 tablet 9   Multiple Vitamin (MULTIVITAMIN) tablet Take 1 tablet by mouth daily.     rosuvastatin (CRESTOR) 5 MG tablet Take 1 tablet (5 mg total) by mouth daily. 90 tablet 3   SYNTHROID 50 MCG tablet Take 1 tablet (50 mcg total) by mouth daily. 90 tablet 3   XARELTO 20 MG TABS tablet Take 1 tablet by mouth once daily 90 tablet 1   Menthol, Topical Analgesic, (BIOFREEZE) 4 % GEL Apply 1 application  topically 2 (two) times daily as needed. (Patient not taking: Reported on 06/03/2021) 473 mL 11   No current facility-administered medications for this visit.    Allergies:   Sulfa antibiotics and Ethanol    ROS:  Please see the history of present illness.   Otherwise, review of systems are positive for none.   All other systems are reviewed and negative.    PHYSICAL EXAM: VS:  BP 140/70   Pulse (!) 56   Ht 5\' 4"  (1.626 m)   Wt 134 lb (60.8 kg)   BMI 23.00 kg/m  , BMI Body mass index is 23 kg/m. GENERAL:  Well appearing NECK:  No jugular venous distention, waveform within normal limits, carotid upstroke brisk and symmetric, no bruits, no thyromegaly LUNGS:  Clear to auscultation bilaterally CHEST:  Unremarkable HEART:  PMI not displaced or sustained,S1 and S2 within normal limits, no S3, no clicks, no rubs, no murmurs, irregular  ABD:  Flat, positive bowel sounds normal in frequency in pitch, no bruits, no rebound, no guarding, no midline pulsatile mass, no hepatomegaly, no splenomegaly EXT:  2 plus pulses throughout, no edema, no cyanosis no clubbing   EKG:  EKG is  ordered today. The ekg ordered today demonstrates atrial fibrillation, rate 56 , axis  within normal limits, intervals within normal limits, no acute ST-T wave changes.  Recent Labs: 04/29/2021: ALT 12; BUN 9; Creatinine, Ser 0.72; Hemoglobin 13.4; Platelets 231; Potassium 4.3; Sodium 137; TSH 1.920    Lipid Panel    Component Value Date/Time   CHOL 144 12/26/2020 0943   TRIG 85 12/26/2020 0943   HDL 54 12/26/2020 0943   CHOLHDL 2.7 12/26/2020 0943   LDLCALC 74 12/26/2020 0943      Wt Readings from Last 3 Encounters:  06/03/21 134 lb (60.8 kg)  05/04/21 134 lb (60.8 kg)  04/23/21 134 lb (60.8 kg)      Other studies Reviewed: Additional studies/ records that were reviewed today includes:  Labs Review of the above records demonstrates:  Please see elsewhere in the note.     ASSESSMENT AND PLAN:  PAF:   Ms. April Duke has a CHA2DS2 - VASc score of 3.   She tolerates anticoagulation.  She tolerates this rhythm.  She tolerates anticoagulation.   No change in therapy  DYSLIPIDEMIA:   LDL was 74 with an HDL of 54 in March.  No change in therapy.   HTN: Her blood pressure is well controlled at home.  It is on the upper limits here today.  No change in therapy.  Current medicines are reviewed at length with the patient today.  The patient does not have concerns regarding medicines.  The following changes have been made:  None  Labs/ tests ordered today include: None  Orders Placed This Encounter  Procedures   EKG 12-Lead      Disposition:   FU with me in 12 months    Signed, Rollene Rotunda, MD  06/03/2021 11:23 AM    [facility]

## 2021-06-03 ENCOUNTER — Encounter: Payer: Self-pay | Admitting: Cardiology

## 2021-06-03 ENCOUNTER — Ambulatory Visit (INDEPENDENT_AMBULATORY_CARE_PROVIDER_SITE_OTHER): Payer: Medicare Other | Admitting: Cardiology

## 2021-06-03 ENCOUNTER — Other Ambulatory Visit: Payer: Self-pay

## 2021-06-03 VITALS — BP 140/70 | HR 56 | Ht 64.0 in | Wt 134.0 lb

## 2021-06-03 DIAGNOSIS — E785 Hyperlipidemia, unspecified: Secondary | ICD-10-CM | POA: Diagnosis not present

## 2021-06-03 DIAGNOSIS — I480 Paroxysmal atrial fibrillation: Secondary | ICD-10-CM

## 2021-06-03 DIAGNOSIS — I10 Essential (primary) hypertension: Secondary | ICD-10-CM

## 2021-06-03 NOTE — Patient Instructions (Signed)
Medication Instructions:  The current medical regimen is effective;  continue present plan and medications.  *If you need a refill on your cardiac medications before your next appointment, please call your pharmacy*  Follow-Up: At [facility], you and your health needs are our priority.  As part of our continuing mission to provide you with exceptional heart care, we have created designated Provider Care Teams.  These Care Teams include your primary Cardiologist (physician) and Advanced Practice Providers (APPs -  Physician Assistants and Nurse Practitioners) who all work together to provide you with the care you need, when you need it.  We recommend signing up for the patient portal called "MyChart".  Sign up information is provided on this After Visit Summary.  MyChart is used to connect with patients for Virtual Visits (Telemedicine).  Patients are able to view lab/test results, encounter notes, upcoming appointments, etc.  Non-urgent messages can be sent to your provider as well.   To learn more about what you can do with MyChart, go to [email or web address].    Your next appointment:   1 year(s)  The format for your next appointment:   In Person  Provider:   James Hochrein, MD   Thank you for choosing [facility]!!    

## 2021-07-22 ENCOUNTER — Encounter: Payer: Self-pay | Admitting: Emergency Medicine

## 2021-07-22 ENCOUNTER — Ambulatory Visit
Admission: EM | Admit: 2021-07-22 | Discharge: 2021-07-22 | Disposition: A | Discharge status: Home / Self Care | Payer: Medicare Other | Attending: Physician Assistant | Admitting: Physician Assistant

## 2021-07-22 ENCOUNTER — Ambulatory Visit (INDEPENDENT_AMBULATORY_CARE_PROVIDER_SITE_OTHER): Payer: Medicare Other

## 2021-07-22 ENCOUNTER — Other Ambulatory Visit: Payer: Self-pay

## 2021-07-22 DIAGNOSIS — M25552 Pain in left hip: Secondary | ICD-10-CM

## 2021-07-22 DIAGNOSIS — G8929 Other chronic pain: Secondary | ICD-10-CM

## 2021-07-22 NOTE — ED Triage Notes (Signed)
Left hip pain that radiated down to left knee states pain has gotten worse over the past 2 to 3 weeks.

## 2021-07-28 NOTE — ED Provider Notes (Signed)
[facility]    CSN: [id number] Arrival date & time: 07/22/21  1515      History   Chief Complaint No chief complaint on file.   HPI April Duke is a 75 y.o. female.   Pt complains of left hip pain.  Pt reports she has had for a while.  Pt is concerned that something has broken.  No recent injury   The history is provided by the patient. No language interpreter was used.   Past Medical History:  Diagnosis Date   Atrial fibrillation (HCC)    Hyperlipidemia    Hypertension    Osteoporosis    Thyroid disease     Patient Active Problem List   Diagnosis Date Noted   Arthritis of right hip 01/30/2021   A-fib (HCC) 04/27/2019   HTN (hypertension) 04/27/2019   Hyperlipemia 04/27/2019   Hypothyroid 04/27/2019   Osteoporosis 04/27/2019   Acute recurrent sinusitis 11/06/2014   GERD (gastroesophageal reflux disease) 07/02/2014    History reviewed. No pertinent surgical history.  OB History   No obstetric history on file.      Home Medications    Prior to Admission medications   Medication Sig Start Date End Date Taking? Authorizing Provider  amLODipine (NORVASC) 5 MG tablet Take 1 tablet (5 mg total) by mouth daily. 05/04/21   Dettinger, Elige Radon, MD  atenolol (TENORMIN) 50 MG tablet Take 1 tablet (50 mg total) by mouth daily. 12/31/20   Dettinger, Elige Radon, MD  calcium carbonate (OSCAL) 1500 (600 Ca) MG TABS tablet Take by mouth 2 (two) times daily with a meal.    [provider]  captopril (CAPOTEN) 25 MG tablet Take 1 tablet by mouth twice daily 09/08/20   Rollene Rotunda, MD  Menthol, Topical Analgesic, (BIOFREEZE) 4 % GEL Apply 1 application topically 2 (two) times daily as needed. Patient not taking: Reported on 06/03/2021 05/04/21   Dettinger, Elige Radon, MD  Multiple Vitamin (MULTIVITAMIN) tablet Take 1 tablet by mouth daily.    [provider]  rosuvastatin (CRESTOR) 5 MG tablet Take 1 tablet (5 mg total) by mouth daily. 05/04/21    Dettinger, Elige Radon, MD  SYNTHROID 50 MCG tablet Take 1 tablet (50 mcg total) by mouth daily. 05/04/21   Dettinger, Elige Radon, MD  XARELTO 20 MG TABS tablet Take 1 tablet by mouth once daily 06/01/21   Rollene Rotunda, MD    Family History Family History  Problem Relation Age of Onset   Cancer Mother        breast   Hypertension Mother    Hypertension Father     Social History Social History   Tobacco Use   Smoking status: Never   Smokeless tobacco: Never  Vaping Use   Vaping Use: Never used  Substance Use Topics   Alcohol use: Never   Drug use: Never     Allergies   Sulfa antibiotics and Ethanol   Review of Systems Review of Systems  All other systems reviewed and are negative.   Physical Exam Triage Vital Signs ED Triage Vitals  Enc Vitals Group     BP 07/22/21 1703 (!) 162/70     Pulse Rate 07/22/21 1703 62     Resp 07/22/21 1703 18     Temp 07/22/21 1703 97.8 F (36.6 C)     Temp Source 07/22/21 1703 Oral     SpO2 07/22/21 1703 98 %     Weight --      Height --  Head Circumference --      Peak Flow --      Pain Score 07/22/21 1705 7     Pain Loc --      Pain Edu? --      Excl. in GC? --    No data found.  Updated Vital Signs BP (!) 162/70 (BP Location: Right Arm)   Pulse 62   Temp 97.8 F (36.6 C) (Oral)   Resp 18   SpO2 98%   Visual Acuity Right Eye Distance:   Left Eye Distance:   Bilateral Distance:    Right Eye Near:   Left Eye Near:    Bilateral Near:     Physical Exam Vitals reviewed.  Constitutional:      Appearance: Normal appearance.  Cardiovascular:     Rate and Rhythm: Normal rate.  Pulmonary:     Effort: Pulmonary effort is normal.  Musculoskeletal:        General: Tenderness present. No swelling or deformity.  Skin:    General: Skin is warm.  Neurological:     General: No focal deficit present.     Mental Status: She is alert.  Psychiatric:        Mood and Affect: Mood normal.     UC Treatments / Results   Labs (all labs ordered are listed, but only abnormal results are displayed) Labs Reviewed - No data to display  EKG   Radiology No results found.  Procedures Procedures (including critical care time)  Medications Ordered in UC Medications - No data to display  Initial Impression / Assessment and Plan / UC Course  I have reviewed the triage vital signs and the nursing notes.  Pertinent labs & imaging results that were available during my care of the patient were reviewed by me and considered in my medical decision making (see chart for details).     MDM:  Xray shows osteopenia Final Clinical Impressions(s) / UC Diagnoses   Final diagnoses:  Chronic left hip pain   Discharge Instructions   None    ED Prescriptions   None    PDMP not reviewed this encounter. An After Visit Summary was printed and given to the patient.    Elson Areas, [place] 07/28/21 1538

## 2021-08-18 ENCOUNTER — Other Ambulatory Visit: Payer: Self-pay

## 2021-08-18 MED ORDER — CAPTOPRIL 25 MG PO TABS: 25.0000 mg | ORAL_TABLET | Freq: Two times a day (BID) | ORAL | 3 refills | Status: DC

## 2021-08-28 ENCOUNTER — Other Ambulatory Visit: Payer: Medicare Other

## 2021-08-28 ENCOUNTER — Other Ambulatory Visit: Payer: Self-pay

## 2021-08-28 DIAGNOSIS — E782 Mixed hyperlipidemia: Secondary | ICD-10-CM

## 2021-08-28 DIAGNOSIS — I10 Essential (primary) hypertension: Secondary | ICD-10-CM

## 2021-08-28 DIAGNOSIS — E039 Hypothyroidism, unspecified: Secondary | ICD-10-CM

## 2021-08-28 LAB — BAYER DCA HB A1C WAIVED: HB A1C (BAYER DCA - WAIVED): 6 % — ABNORMAL HIGH (ref 4.8–5.6)

## 2021-08-29 LAB — CMP14+EGFR
ALT: 11 IU/L (ref 0–32)
AST: 20 IU/L (ref 0–40)
Albumin/Globulin Ratio: 1.6 (ref 1.2–2.2)
Albumin: 4.9 g/dL — ABNORMAL HIGH (ref 3.7–4.7)
Alkaline Phosphatase: 86 IU/L (ref 44–121)
BUN/Creatinine Ratio: 14 (ref 12–28)
BUN: 9 mg/dL (ref 8–27)
Bilirubin Total: 0.4 mg/dL (ref 0.0–1.2)
CO2: 20 mmol/L (ref 20–29)
Calcium: 10 mg/dL (ref 8.7–10.3)
Chloride: 97 mmol/L (ref 96–106)
Creatinine, Ser: 0.66 mg/dL (ref 0.57–1.00)
Globulin, Total: 3 g/dL (ref 1.5–4.5)
Glucose: 116 mg/dL — ABNORMAL HIGH (ref 70–99)
Potassium: 4.4 mmol/L (ref 3.5–5.2)
Sodium: 137 mmol/L (ref 134–144)
Total Protein: 7.9 g/dL (ref 6.0–8.5)
eGFR: 91 mL/min/{1.73_m2} (ref >59)

## 2021-08-29 LAB — LIPID PANEL
Chol/HDL Ratio: 2.8 ratio (ref 0.0–4.4)
Cholesterol, Total: 150 mg/dL (ref 100–199)
HDL: 53 mg/dL (ref >39)
LDL Chol Calc (NIH): 80 mg/dL (ref 0–99)
Triglycerides: 91 mg/dL (ref 0–149)
VLDL Cholesterol Cal: 17 mg/dL (ref 5–40)

## 2021-08-29 LAB — CBC WITH DIFFERENTIAL/PLATELET
Basophils Absolute: 0.1 10*3/uL (ref 0.0–0.2)
Basos: 1 %
EOS (ABSOLUTE): 0.2 10*3/uL (ref 0.0–0.4)
Eos: 3 %
Hematocrit: 41.2 % (ref 34.0–46.6)
Hemoglobin: 13.8 g/dL (ref 11.1–15.9)
Immature Grans (Abs): 0 10*3/uL (ref 0.0–0.1)
Immature Granulocytes: 0 %
Lymphocytes Absolute: 1.6 10*3/uL (ref 0.7–3.1)
Lymphs: 28 %
MCH: 29.7 pg (ref 26.6–33.0)
MCHC: 33.5 g/dL (ref 31.5–35.7)
MCV: 89 fL (ref 79–97)
Monocytes Absolute: 0.8 10*3/uL (ref 0.1–0.9)
Monocytes: 14 %
Neutrophils Absolute: 3.2 10*3/uL (ref 1.4–7.0)
Neutrophils: 54 %
Platelets: 242 10*3/uL (ref 150–450)
RBC: 4.65 x10E6/uL (ref 3.77–5.28)
RDW: 12.4 % (ref 11.7–15.4)
WBC: 5.9 10*3/uL (ref 3.4–10.8)

## 2021-08-29 LAB — TSH: TSH: 2.18 u[IU]/mL (ref 0.450–4.500)

## 2021-09-04 ENCOUNTER — Encounter: Payer: Self-pay | Admitting: Family Medicine

## 2021-09-04 ENCOUNTER — Ambulatory Visit (INDEPENDENT_AMBULATORY_CARE_PROVIDER_SITE_OTHER): Payer: Medicare Other | Admitting: Family Medicine

## 2021-09-04 ENCOUNTER — Other Ambulatory Visit: Payer: Self-pay

## 2021-09-04 VITALS — BP 144/66 | HR 56 | Ht 64.0 in | Wt 135.0 lb

## 2021-09-04 DIAGNOSIS — E782 Mixed hyperlipidemia: Secondary | ICD-10-CM | POA: Diagnosis not present

## 2021-09-04 DIAGNOSIS — E039 Hypothyroidism, unspecified: Secondary | ICD-10-CM | POA: Diagnosis not present

## 2021-09-04 DIAGNOSIS — I10 Essential (primary) hypertension: Secondary | ICD-10-CM

## 2021-09-04 DIAGNOSIS — R7303 Prediabetes: Secondary | ICD-10-CM | POA: Diagnosis not present

## 2021-09-04 NOTE — Progress Notes (Signed)
BP (!) 144/66   Pulse (!) 56   Ht _0  (1.626 m)   Wt 135 lb (61.2 kg)   SpO2 97%   BMI 23.17 kg/m    Subjective:   Patient ID: April Duke, female    DOB: 06-Sep-1946, 75 y.o.   MRN: [id number]  HPI: April Duke is a 75 y.o. female presenting on 09/04/2021 for Medical Management of Chronic Issues, Hypertension, and Hypothyroidism   HPI Prediabetes  patient comes in today for recheck of his diabetes. Patient has been currently taking No medication currently, diet controlled, A1c 6.0.. Patient is currently on an ACE inhibitor/ARB. Patient has not seen an ophthalmologist this year. Patient Denies any issues with their feet. The symptom started onset as an adult Hypertension and hyperlipidemia and hypothyroidism ARE RELATED TO DM   Hypertension Patient is currently on Captopril and atenolol and amlodipine, does note she did not take her medicine this morning, and their blood pressure today is 186/70 and 170/49. Patient denies any lightheadedness or dizziness. Patient denies headaches, blurred vision, chest pains, shortness of breath, or weakness. Denies any side effects from medication and is content with current medication.   Hyperlipidemia Patient is coming in for recheck of his hyperlipidemia. The patient is currently taking Crestor. They deny any issues with myalgias or history of liver damage from it. They deny any focal numbness or weakness or chest pain.   Hypothyroidism recheck Patient is coming in for thyroid recheck today as well. They deny any issues with hair changes or heat or cold problems or diarrhea or constipation. They deny any chest pain or palpitations. They are currently on levothyroxine 50 micrograms   Relevant past medical, surgical, family and social history reviewed and updated as indicated. Interim medical history since our last visit reviewed. Allergies and medications reviewed and updated.  Review of Systems  Constitutional:  Negative for chills and fever.   Eyes:  Negative for visual disturbance.  Respiratory:  Negative for chest tightness and shortness of breath.   Cardiovascular:  Negative for chest pain and leg swelling.  Musculoskeletal:  Negative for back pain and gait problem.  Skin:  Negative for rash.  Neurological:  Negative for dizziness, light-headedness and headaches.  Psychiatric/Behavioral:  Negative for agitation and behavioral problems.   All other systems reviewed and are negative.  Per HPI unless specifically indicated above   Allergies as of 09/04/2021       Reactions   Sulfa Antibiotics Rash   Ethanol    Other reaction(s): RAPID HEARTRATE         Medication List        Accurate as of September 04, 2021  9:10 AM. If you have any questions, ask your nurse or doctor.          STOP taking these medications    Biofreeze 4 % Gel Generic drug: Menthol (Topical Analgesic) Stopped by: Fransisca Kaufmann Missael Ferrari, MD       TAKE these medications    amLODipine 5 MG tablet Commonly known as: NORVASC Take 1 tablet (5 mg total) by mouth daily.   atenolol 50 MG tablet Commonly known as: TENORMIN Take 1 tablet (50 mg total) by mouth daily.   calcium carbonate 1500 (600 Ca) MG Tabs tablet Commonly known as: OSCAL Take by mouth 2 (two) times daily with a meal.   captopril 25 MG tablet Commonly known as: CAPOTEN Take 1 tablet (25 mg total) by mouth 2 (two) times daily.   multivitamin tablet Take  1 tablet by mouth daily.   rosuvastatin 5 MG tablet Commonly known as: CRESTOR Take 1 tablet (5 mg total) by mouth daily.   Synthroid 50 MCG tablet Generic drug: levothyroxine Take 1 tablet (50 mcg total) by mouth daily.   Xarelto 20 MG Tabs tablet Generic drug: rivaroxaban Take 1 tablet by mouth once daily         Objective:   BP (!) 144/66   Pulse (!) 56   Ht _0  (1.626 m)   Wt 135 lb (61.2 kg)   SpO2 97%   BMI 23.17 kg/m   Wt Readings from Last 3 Encounters:  09/04/21 135 lb (61.2 kg)   06/03/21 134 lb (60.8 kg)  05/04/21 134 lb (60.8 kg)    Physical Exam Vitals and nursing note reviewed.  Constitutional:      General: She is not in acute distress.    Appearance: She is well-developed. She is not diaphoretic.  Eyes:     Conjunctiva/sclera: Conjunctivae normal.     Pupils: Pupils are equal, round, and reactive to light.  Cardiovascular:     Rate and Rhythm: Normal rate and regular rhythm.     Heart sounds: Normal heart sounds. No murmur heard. Pulmonary:     Effort: Pulmonary effort is normal. No respiratory distress.     Breath sounds: Normal breath sounds. No wheezing.  Musculoskeletal:        General: No tenderness. Normal range of motion.  Skin:    General: Skin is warm and dry.     Findings: No rash.  Neurological:     Mental Status: She is alert and oriented to person, place, and time.     Coordination: Coordination normal.  Psychiatric:        Behavior: Behavior normal.    Results for orders placed or performed in visit on 08/28/21  TSH  Result Value Ref Range   TSH 2.180 0.450 - 4.500 uIU/mL  Lipid panel  Result Value Ref Range   Cholesterol, Total 150 100 - 199 mg/dL   Triglycerides 91 0 - 149 mg/dL   HDL 53 >39 mg/dL   VLDL Cholesterol Cal 17 5 - 40 mg/dL   LDL Chol Calc (NIH) 80 0 - 99 mg/dL   Chol/HDL Ratio 2.8 0.0 - 4.4 ratio  CMP14+EGFR  Result Value Ref Range   Glucose 116 (H) 70 - 99 mg/dL   BUN 9 8 - 27 mg/dL   Creatinine, Ser 0.66 0.57 - 1.00 mg/dL   eGFR 91 >59 mL/min/1.73   BUN/Creatinine Ratio 14 12 - 28   Sodium 137 134 - 144 mmol/L   Potassium 4.4 3.5 - 5.2 mmol/L   Chloride 97 96 - 106 mmol/L   CO2 20 20 - 29 mmol/L   Calcium 10.0 8.7 - 10.3 mg/dL   Total Protein 7.9 6.0 - 8.5 g/dL   Albumin 4.9 (H) 3.7 - 4.7 g/dL   Globulin, Total 3.0 1.5 - 4.5 g/dL   Albumin/Globulin Ratio 1.6 1.2 - 2.2   Bilirubin Total 0.4 0.0 - 1.2 mg/dL   Alkaline Phosphatase 86 44 - 121 IU/L   AST 20 0 - 40 IU/L   ALT 11 0 - 32 IU/L  CBC  with Differential/Platelet  Result Value Ref Range   WBC 5.9 3.4 - 10.8 x10E3/uL   RBC 4.65 3.77 - 5.28 x10E6/uL   Hemoglobin 13.8 11.1 - 15.9 g/dL   Hematocrit 41.2 34.0 - 46.6 %   MCV 89 79 - 97  fL   MCH 29.7 26.6 - 33.0 pg   MCHC 33.5 31.5 - 35.7 g/dL   RDW 12.4 11.7 - 15.4 %   Platelets 242 150 - 450 x10E3/uL   Neutrophils 54 Not Estab. %   Lymphs 28 Not Estab. %   Monocytes 14 Not Estab. %   Eos 3 Not Estab. %   Basos 1 Not Estab. %   Neutrophils Absolute 3.2 1.4 - 7.0 x10E3/uL   Lymphocytes Absolute 1.6 0.7 - 3.1 x10E3/uL   Monocytes Absolute 0.8 0.1 - 0.9 x10E3/uL   EOS (ABSOLUTE) 0.2 0.0 - 0.4 x10E3/uL   Basophils Absolute 0.1 0.0 - 0.2 x10E3/uL   Immature Granulocytes 0 Not Estab. %   Immature Grans (Abs) 0.0 0.0 - 0.1 x10E3/uL  Bayer DCA Hb A1c Waived  Result Value Ref Range   HB A1C (BAYER DCA - WAIVED) 6.0 (H) 4.8 - 5.6 %    Assessment & Plan:   Problem List Items Addressed This Visit       Cardiovascular and Mediastinum   HTN (hypertension)     Endocrine   Hypothyroid - Primary     Other   Prediabetes   Hyperlipemia    No change in current medicine, mainly focus on diet.  Repeat blood pressure 144/66 looks better, allow permissive hypertension and will monitor Follow up plan: Return if symptoms worsen or fail to improve, for Prediabetes and hypertension hyperlipidemia and thyroid.  Counseling provided for all of the vaccine components No orders of the defined types were placed in this encounter.   Caryl Pina, MD [facility] 09/04/2021, 9:10 AM

## 2021-09-04 NOTE — Patient Instructions (Signed)

## 2021-09-23 IMAGING — DX DG HIP (WITH OR WITHOUT PELVIS) 2-3V*L*
3 series · 3 of 3 positions shown · non-contrast
Comparison: AP pelvis and RIGHT hip x-rays 01/20/2020. No prior
LEFT hip x-rays.

CLINICAL DATA: Chronic BILATERAL hip pain, LEFT greater than RIGHT.
No known injuries.

EXAM:
DG HIP (WITH OR WITHOUT PELVIS) 2-3V LEFT

[pelvis ap]
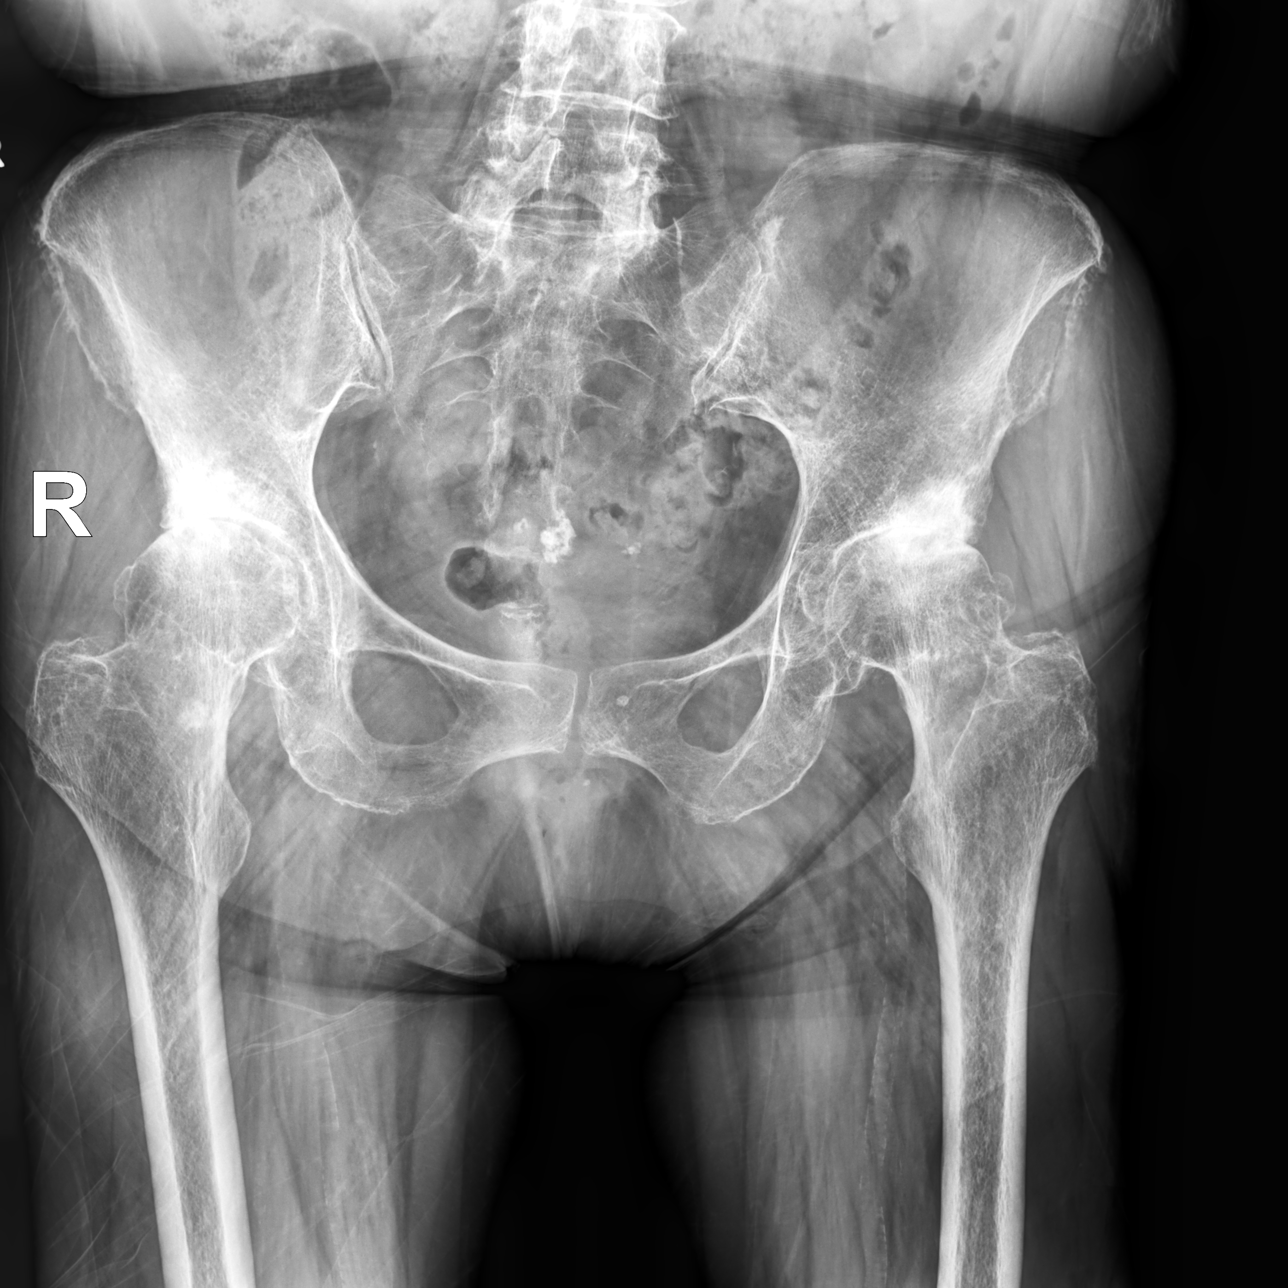

[hip ap]
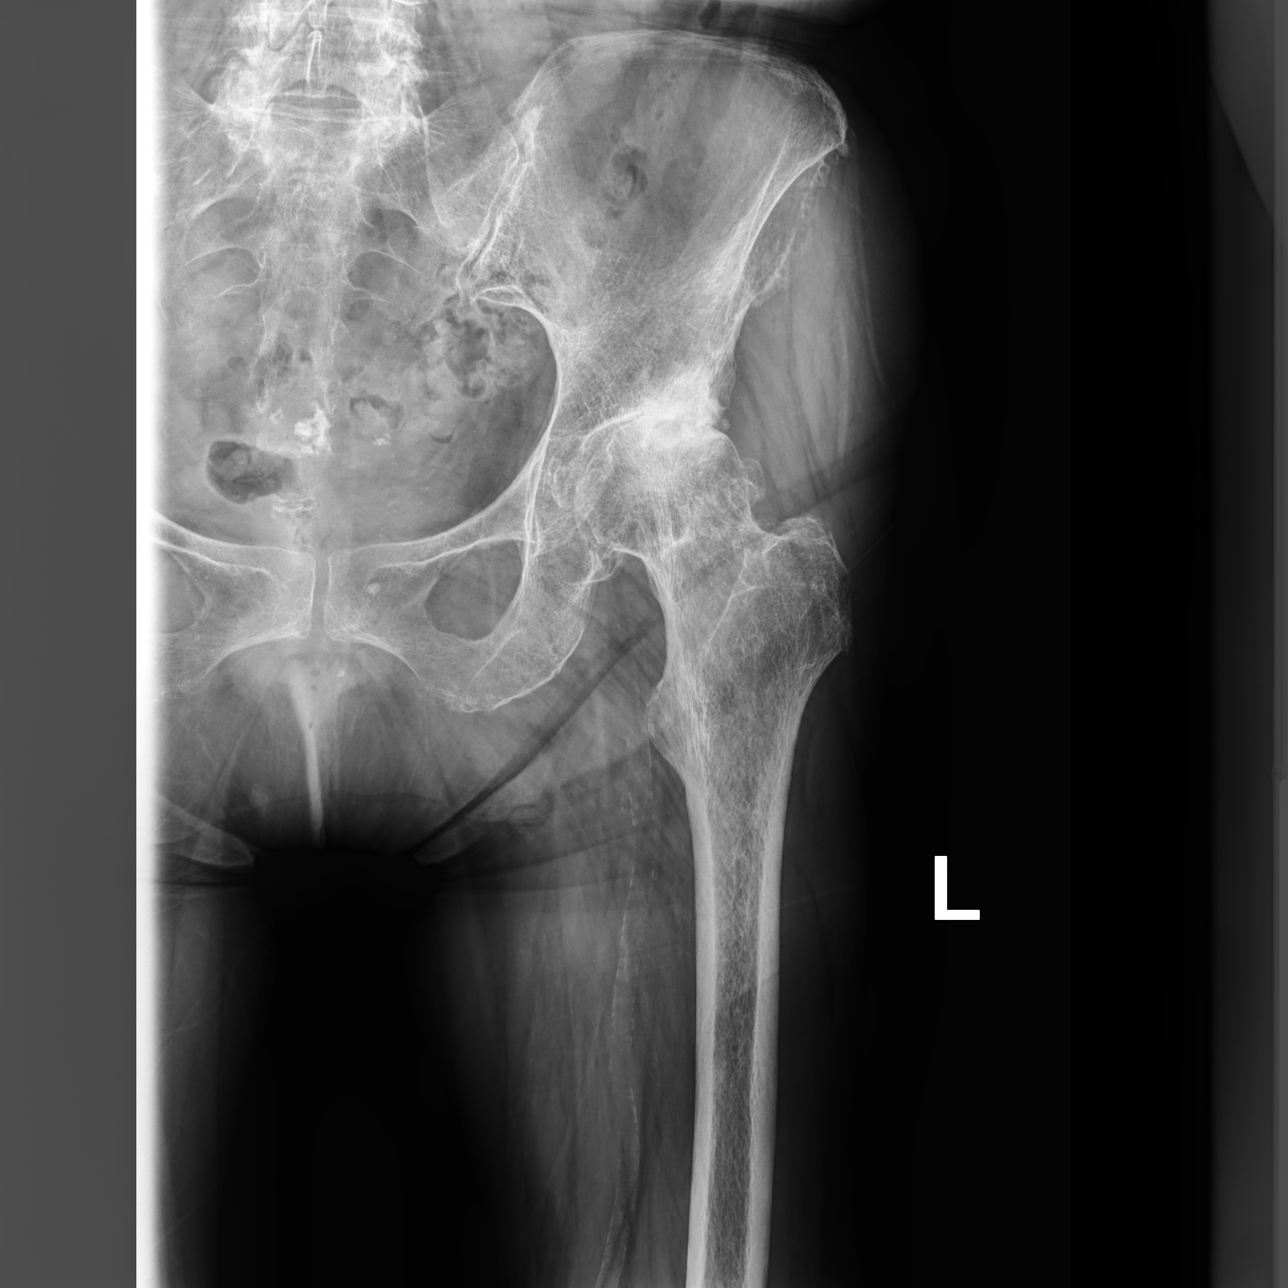

[hip (frog leg)]
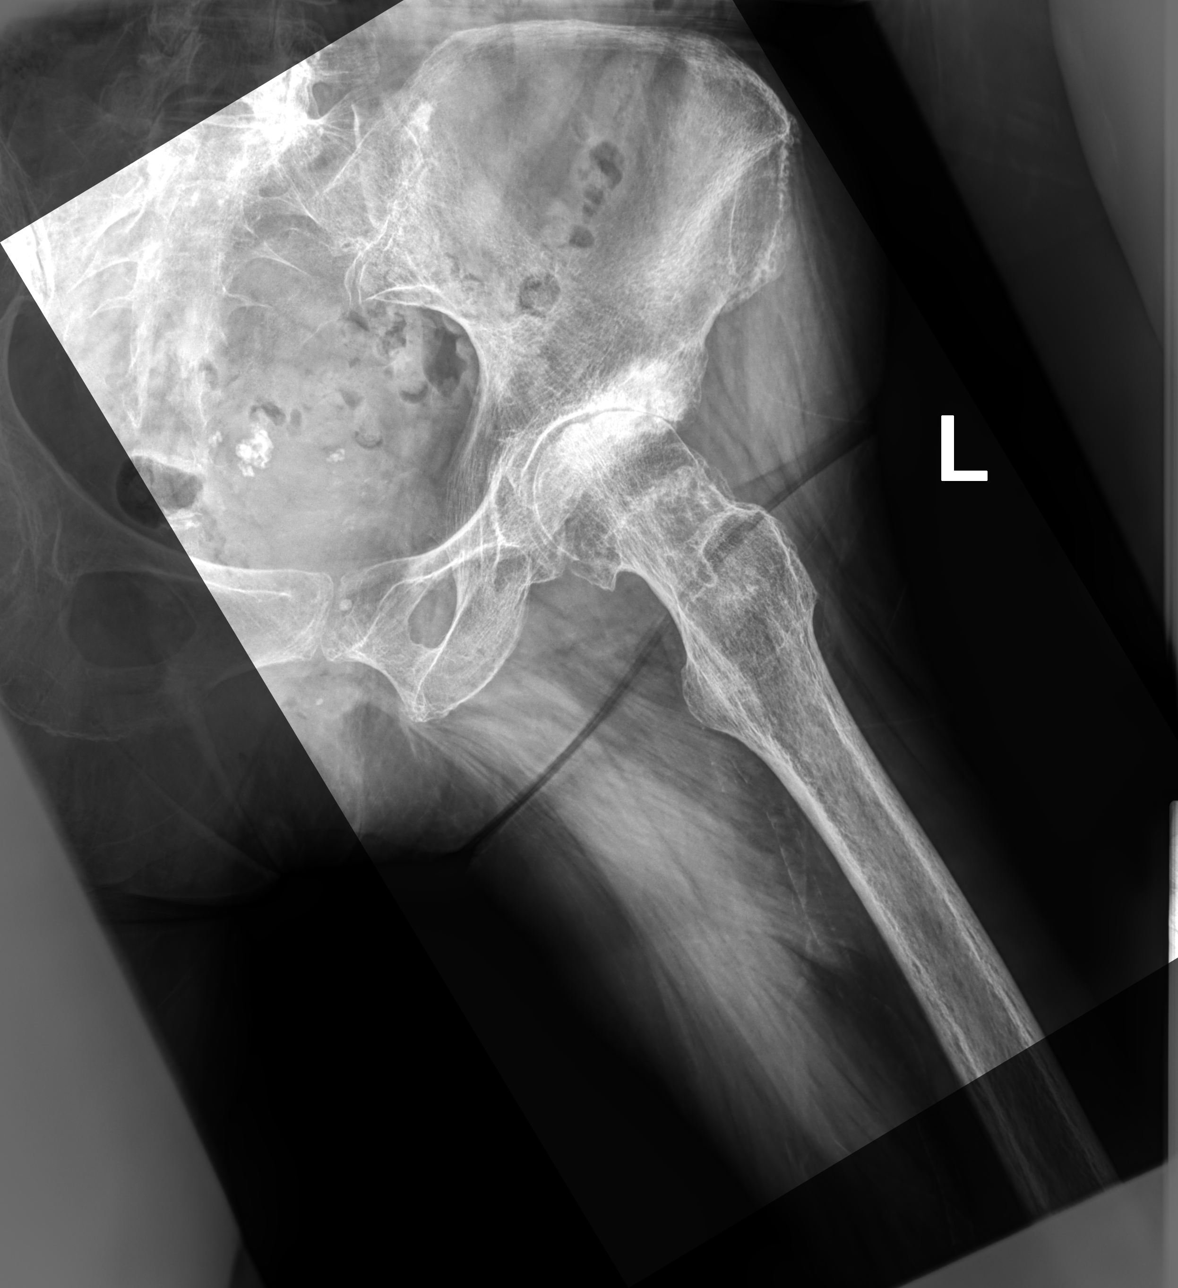

[3 of 3 positions shown; findings below may reference images not displayed]

FINDINGS: Complete loss of the joint space in the LEFT hip with associated
degenerative sclerosis involving the roof of the acetabulum and
femoral head. Mild irregularity in the femoral head. Mild osseous
demineralization. Findings have progressed since the December 2019 [facility]
pelvis examination.

Complete loss of the joint space in the contralateral RIGHT hip with
associated degenerative sclerosis involving the roof of the
acetabulum and the femoral head, also progressive since the prior
examination. Bone island in the RIGHT femoral neck. Sacroiliac
joints anatomically aligned with mild degenerative changes.
Symphysis pubis intact. Degenerative changes involving the
visualized lower lumbar spine.
IMPRESSION: Severe osteoarthritis in both hips, LEFT greater than RIGHT,
possibly secondary to osteonecrosis in the femoral heads.

## 2021-09-24 ENCOUNTER — Other Ambulatory Visit: Payer: Self-pay | Admitting: Family Medicine

## 2021-11-03 ENCOUNTER — Other Ambulatory Visit: Payer: Self-pay | Admitting: Family Medicine

## 2021-11-27 ENCOUNTER — Other Ambulatory Visit: Payer: Self-pay | Admitting: Cardiology

## 2021-11-27 NOTE — Telephone Encounter (Signed)
Prescription refill request for Xarelto received.  Indication:Afib Last office visit:8/22 Weight:61.2 kg Age:76 Scr:0.6 CrCl:77.07 ml/min  Prescription refilled

## 2021-12-06 ENCOUNTER — Encounter (HOSPITAL_COMMUNITY): Payer: Self-pay

## 2021-12-06 ENCOUNTER — Emergency Department (HOSPITAL_COMMUNITY)
Admission: EM | Admit: 2021-12-06 | Discharge: 2021-12-06 | Disposition: A | Discharge status: Home / Self Care | Payer: Medicare Other | Attending: Emergency Medicine | Admitting: Emergency Medicine

## 2021-12-06 ENCOUNTER — Emergency Department (HOSPITAL_COMMUNITY): Payer: Medicare Other

## 2021-12-06 ENCOUNTER — Other Ambulatory Visit: Payer: Self-pay

## 2021-12-06 DIAGNOSIS — M25552 Pain in left hip: Secondary | ICD-10-CM | POA: Diagnosis not present

## 2021-12-06 DIAGNOSIS — M79606 Pain in leg, unspecified: Secondary | ICD-10-CM | POA: Diagnosis not present

## 2021-12-06 DIAGNOSIS — Z79899 Other long term (current) drug therapy: Secondary | ICD-10-CM | POA: Insufficient documentation

## 2021-12-06 DIAGNOSIS — I4891 Unspecified atrial fibrillation: Secondary | ICD-10-CM | POA: Diagnosis not present

## 2021-12-06 DIAGNOSIS — Z7901 Long term (current) use of anticoagulants: Secondary | ICD-10-CM | POA: Diagnosis not present

## 2021-12-06 DIAGNOSIS — M25551 Pain in right hip: Secondary | ICD-10-CM | POA: Diagnosis not present

## 2021-12-06 DIAGNOSIS — I10 Essential (primary) hypertension: Secondary | ICD-10-CM | POA: Diagnosis not present

## 2021-12-06 MED ORDER — HYDROCODONE-ACETAMINOPHEN 5-325 MG PO TABS: 1.0000 | ORAL_TABLET | Freq: Four times a day (QID) | ORAL | 0 refills | Status: DC | PRN

## 2021-12-06 NOTE — ED Provider Notes (Addendum)
[facility] EMERGENCY DEPARTMENT Provider Note   CSN: [id number] Arrival date & time: 12/06/21  1049     History  Chief Complaint  Patient presents with   Leg Pain    April Duke is a 76 y.o. female.  Patient with a complaint of bilateral hip pain left worse than right.  Chart review shows that patient's had somewhat chronic pain in this area.  Patient states this is worse denies any fall or injury.  Really denied the pain now moving into the thighs.  No numbness or weakness to the feet.  No back pain.  Medical history sniffing for hypertension thyroid disease hyperlipidemia atrial fibrillation patient's on Xarelto.  And history of osteoporosis.      Home Medications Prior to Admission medications   Medication Sig Start Date End Date Taking? Authorizing Provider  amLODipine (NORVASC) 5 MG tablet Take 1 tablet by mouth once daily 09/24/21   Dettinger, Elige Radon, MD  atenolol (TENORMIN) 50 MG tablet Take 1 tablet by mouth once daily 11/03/21   Dettinger, Elige Radon, MD  calcium carbonate (OSCAL) 1500 (600 Ca) MG TABS tablet Take by mouth 2 (two) times daily with a meal.    [provider]  captopril (CAPOTEN) 25 MG tablet Take 1 tablet (25 mg total) by mouth 2 (two) times daily. 08/18/21   Rollene Rotunda, MD  Multiple Vitamin (MULTIVITAMIN) tablet Take 1 tablet by mouth daily.    [provider]  rosuvastatin (CRESTOR) 5 MG tablet Take 1 tablet (5 mg total) by mouth daily. 05/04/21   Dettinger, Elige Radon, MD  SYNTHROID 50 MCG tablet Take 1 tablet (50 mcg total) by mouth daily. 05/04/21   Dettinger, Elige Radon, MD  XARELTO 20 MG TABS tablet Take 1 tablet by mouth once daily 11/27/21   Rollene Rotunda, MD      Allergies    Sulfa antibiotics and Ethanol    Review of Systems   Review of Systems  Constitutional:  Negative for chills and fever.  HENT:  Negative for ear pain and sore throat.   Eyes:  Negative for pain and visual disturbance.  Respiratory:  Negative for  cough and shortness of breath.   Cardiovascular:  Negative for chest pain, palpitations and leg swelling.  Gastrointestinal:  Negative for abdominal pain and vomiting.  Genitourinary:  Negative for dysuria and hematuria.  Musculoskeletal:  Negative for arthralgias and back pain.  Skin:  Negative for color change and rash.  Neurological:  Negative for seizures and syncope.  All other systems reviewed and are negative.  Physical Exam Updated Vital Signs BP (!) 125/52 (BP Location: Left Arm)    Pulse 60    Temp 98.2 F (36.8 C) (Oral)    Resp 19    Ht 1.626 m (5\' 4" )    Wt 59 kg    SpO2 99%    BMI 22.31 kg/m  Physical Exam Vitals and nursing note reviewed.  Constitutional:      General: She is not in acute distress.    Appearance: Normal appearance. She is well-developed.  HENT:     Head: Normocephalic and atraumatic.  Eyes:     Extraocular Movements: Extraocular movements intact.     Conjunctiva/sclera: Conjunctivae normal.     Pupils: Pupils are equal, round, and reactive to light.  Cardiovascular:     Rate and Rhythm: Normal rate. Rhythm irregular.     Heart sounds: No murmur heard. Pulmonary:     Effort: Pulmonary effort is normal. No  respiratory distress.     Breath sounds: Normal breath sounds.  Abdominal:     Palpations: Abdomen is soft.     Tenderness: There is no abdominal tenderness.  Musculoskeletal:        General: Tenderness present. No swelling, deformity or signs of injury.     Cervical back: Normal range of motion and neck supple.     Right lower leg: No edema.     Left lower leg: No edema.     Comments: Some discomfort with range of motion of both hips.  Left greater than right.  Distally no leg swelling.  Dorsalis pedis pulse both feet is 2+.  Good movement of the toes sensation intact.  Skin:    General: Skin is warm and dry.     Capillary Refill: Capillary refill takes less than 2 seconds.  Neurological:     General: No focal deficit present.     Mental  Status: She is alert and oriented to person, place, and time.     Cranial Nerves: No cranial nerve deficit.     Sensory: No sensory deficit.  Psychiatric:        Mood and Affect: Mood normal.    ED Results / Procedures / Treatments   Labs (all labs ordered are listed, but only abnormal results are displayed) Labs Reviewed - No data to display  EKG None  Radiology No results found.  Procedures Procedures    Medications Ordered in ED Medications - No data to display  ED Course/ Medical Decision Making/ A&P                           Medical Decision Making Amount and/or Complexity of Data Reviewed Radiology: ordered.  Risk Prescription drug management.   Patient without a fall or injury.  But is describing increased pain.  Will get x-ray to be on the safe side we will get bilateral hip and pelvis.  If no acute bony abnormalities will treat with tramadol.  Have her follow back up with her primary care doctor  X-rays of both hips and pelvis shows advanced degenerative changes in both hips without any acute bony abnormality.  This would explain the patient's pain.  We will treat her with hydrocodone.  Have her follow-up with her doctor.  No evidence of any radicular symptoms.  Chart review shows the patient in December was seen by [facility] orthoped[facility] in [place].  She seems not to remember that.  But when I showed her on the computer then she remember going there.  Recommend that she follow-up with them as well but she was not certain she wanted to do that so we will have her follow-up with her primary care doctor.  But it does appear that they were trying to address the bilateral hip pain.  And were considering injections.  Final Clinical Impression(s) / ED Diagnoses Final diagnoses:  Hip pain, bilateral    Rx / DC Orders ED Discharge Orders     None         Fredia Sorrow, MD 12/06/21 1248    Fredia Sorrow, MD 12/06/21 1348    Fredia Sorrow, MD 12/06/21  1359

## 2021-12-06 NOTE — Discharge Instructions (Addendum)
Trays show significant degenerative changes in both hips.  But no fractures.  Take the hydrocodone as needed for pain.  Make an appointment to follow-up with your doctor.  Also you can follow back up with orthopedics at [facility] in [place].

## 2021-12-06 NOTE — ED Triage Notes (Signed)
Pt arrived to ED for bilateral thigh pain that has been increasing over the past few months but worse the last few days. Pt is still able to walk with assistance.

## 2021-12-28 ENCOUNTER — Other Ambulatory Visit: Payer: Self-pay | Admitting: *Deleted

## 2021-12-28 ENCOUNTER — Other Ambulatory Visit: Payer: Medicare Other

## 2021-12-28 DIAGNOSIS — R7303 Prediabetes: Secondary | ICD-10-CM

## 2021-12-28 DIAGNOSIS — E039 Hypothyroidism, unspecified: Secondary | ICD-10-CM

## 2021-12-28 DIAGNOSIS — E782 Mixed hyperlipidemia: Secondary | ICD-10-CM

## 2021-12-28 DIAGNOSIS — I10 Essential (primary) hypertension: Secondary | ICD-10-CM

## 2021-12-28 LAB — BAYER DCA HB A1C WAIVED: HB A1C (BAYER DCA - WAIVED): 6.3 % — ABNORMAL HIGH (ref 4.8–5.6)

## 2021-12-29 LAB — CBC WITH DIFFERENTIAL/PLATELET
Basophils Absolute: 0 10*3/uL (ref 0.0–0.2)
Basos: 1 %
EOS (ABSOLUTE): 0.2 10*3/uL (ref 0.0–0.4)
Eos: 3 %
Hematocrit: 39.3 % (ref 34.0–46.6)
Hemoglobin: 13.1 g/dL (ref 11.1–15.9)
Immature Grans (Abs): 0 10*3/uL (ref 0.0–0.1)
Immature Granulocytes: 1 %
Lymphocytes Absolute: 1.3 10*3/uL (ref 0.7–3.1)
Lymphs: 19 %
MCH: 29.8 pg (ref 26.6–33.0)
MCHC: 33.3 g/dL (ref 31.5–35.7)
MCV: 90 fL (ref 79–97)
Monocytes Absolute: 1 10*3/uL — ABNORMAL HIGH (ref 0.1–0.9)
Monocytes: 16 %
Neutrophils Absolute: 4 10*3/uL (ref 1.4–7.0)
Neutrophils: 60 %
Platelets: 256 10*3/uL (ref 150–450)
RBC: 4.39 x10E6/uL (ref 3.77–5.28)
RDW: 13 % (ref 11.7–15.4)
WBC: 6.5 10*3/uL (ref 3.4–10.8)

## 2021-12-29 LAB — CMP14+EGFR
ALT: 11 IU/L (ref 0–32)
AST: 18 IU/L (ref 0–40)
Albumin/Globulin Ratio: 1.6 (ref 1.2–2.2)
Albumin: 4.4 g/dL (ref 3.7–4.7)
Alkaline Phosphatase: 90 IU/L (ref 44–121)
BUN/Creatinine Ratio: 15 (ref 12–28)
BUN: 9 mg/dL (ref 8–27)
Bilirubin Total: 0.4 mg/dL (ref 0.0–1.2)
CO2: 22 mmol/L (ref 20–29)
Calcium: 9.6 mg/dL (ref 8.7–10.3)
Chloride: 96 mmol/L (ref 96–106)
Creatinine, Ser: 0.59 mg/dL (ref 0.57–1.00)
Globulin, Total: 2.8 g/dL (ref 1.5–4.5)
Glucose: 106 mg/dL — ABNORMAL HIGH (ref 70–99)
Potassium: 4.4 mmol/L (ref 3.5–5.2)
Sodium: 135 mmol/L (ref 134–144)
Total Protein: 7.2 g/dL (ref 6.0–8.5)
eGFR: 93 mL/min/{1.73_m2} (ref >59)

## 2021-12-29 LAB — LIPID PANEL
Chol/HDL Ratio: 2.7 ratio (ref 0.0–4.4)
Cholesterol, Total: 134 mg/dL (ref 100–199)
HDL: 50 mg/dL (ref >39)
LDL Chol Calc (NIH): 69 mg/dL (ref 0–99)
Triglycerides: 78 mg/dL (ref 0–149)
VLDL Cholesterol Cal: 15 mg/dL (ref 5–40)

## 2021-12-29 LAB — TSH: TSH: 1.16 u[IU]/mL (ref 0.450–4.500)

## 2022-01-01 ENCOUNTER — Ambulatory Visit (INDEPENDENT_AMBULATORY_CARE_PROVIDER_SITE_OTHER): Payer: Medicare Other | Admitting: Family Medicine

## 2022-01-01 ENCOUNTER — Encounter: Payer: Self-pay | Admitting: Family Medicine

## 2022-01-01 VITALS — BP 172/77 | HR 52 | Ht 64.0 in | Wt 132.0 lb

## 2022-01-01 DIAGNOSIS — E039 Hypothyroidism, unspecified: Secondary | ICD-10-CM | POA: Diagnosis not present

## 2022-01-01 DIAGNOSIS — I10 Essential (primary) hypertension: Secondary | ICD-10-CM

## 2022-01-01 DIAGNOSIS — E782 Mixed hyperlipidemia: Secondary | ICD-10-CM | POA: Diagnosis not present

## 2022-01-01 MED ORDER — AMLODIPINE BESYLATE 5 MG PO TABS: 5.0000 mg | ORAL_TABLET | Freq: Every day | ORAL | 3 refills | Status: DC

## 2022-01-01 MED ORDER — ATENOLOL 50 MG PO TABS: 50.0000 mg | ORAL_TABLET | Freq: Every day | ORAL | 3 refills | Status: DC

## 2022-01-01 NOTE — Progress Notes (Signed)
? ?BP (!) 172/77   Pulse (!) 52   Ht '5\' 4"'  (1.626 m)   Wt 132 lb (59.9 kg)   SpO2 99%   BMI 22.66 kg/m?   ? ?Subjective:  ? ?Patient ID: April Duke, female    DOB: 03-22-1946, 76 y.o.   MRN: [id number] ? ?HPI: ?April Duke is a 76 y.o. female presenting on 01/01/2022 for Medical Management of Chronic Issues, Hypertension, and Hypothyroidism ? ? ?HPI ?Prediabetes  ?patient comes in today for recheck of his diabetes. Patient has been currently taking no medication currently, A1c is up just slightly at 6.3 today.. Patient is not currently on an ACE inhibitor/ARB. Patient has not seen an ophthalmologist this year. Patient denies any issues with their feet. The symptom started onset as an adult hypertension and hyperlipidemia ARE RELATED TO DM  ? ?Hypertension ?Patient is currently on amlodipine and atenolol and captopril, and their blood pressure today is 172/77.  She just did get a steroid injection a couple days ago.. Patient denies any lightheadedness or dizziness. Patient denies headaches, blurred vision, chest pains, shortness of breath, or weakness. Denies any side effects from medication and is content with current medication.  ? ?Hyperlipidemia ?Patient is coming in for recheck of his hyperlipidemia. The patient is currently taking Crestor. They deny any issues with myalgias or history of liver damage from it. They deny any focal numbness or weakness or chest pain.  ? ?Hypothyroidism recheck ?Patient is coming in for thyroid recheck today as well. They deny any issues with hair changes or heat or cold problems or diarrhea or constipation. They deny any chest pain or palpitations. They are currently on levothyroxine 50 micrograms  ? ?Relevant past medical, surgical, family and social history reviewed and updated as indicated. Interim medical history since our last visit reviewed. ?Allergies and medications reviewed and updated. ? ?Review of Systems  ?Constitutional:  Negative for chills and fever.  ?Eyes:   Negative for visual disturbance.  ?Respiratory:  Negative for chest tightness and shortness of breath.   ?Cardiovascular:  Negative for chest pain and leg swelling.  ?Musculoskeletal:  Positive for arthralgias. Negative for back pain and gait problem.  ?Skin:  Negative for rash.  ?Neurological:  Negative for dizziness, light-headedness and headaches.  ?Psychiatric/Behavioral:  Negative for agitation and behavioral problems.   ?All other systems reviewed and are negative. ? ?Per HPI unless specifically indicated above ? ? ?Allergies as of 01/01/2022   ? ?   Reactions  ? Sulfa Antibiotics Rash  ? Ethanol   ? Other reaction(s): RAPID HEARTRATE   ? ?  ? ?  ?Medication List  ?  ? ?  ? Accurate as of January 01, 2022  9:02 AM. If you have any questions, ask your nurse or doctor.  ?  ?  ? ?  ? ?STOP taking these medications   ? ?HYDROcodone-acetaminophen 5-325 MG tablet ?Commonly known as: NORCO/VICODIN ?Stopped by: Worthy Rancher, MD ?  ? ?  ? ?TAKE these medications   ? ?amLODipine 5 MG tablet ?Commonly known as: NORVASC ?Take 1 tablet (5 mg total) by mouth daily. ?  ?atenolol 50 MG tablet ?Commonly known as: TENORMIN ?Take 1 tablet (50 mg total) by mouth daily. ?  ?calcium carbonate 1500 (600 Ca) MG Tabs tablet ?Commonly known as: OSCAL ?Take by mouth 2 (two) times daily with a meal. ?  ?captopril 25 MG tablet ?Commonly known as: CAPOTEN ?Take 1 tablet (25 mg total) by mouth 2 (two) times daily. ?  ?  multivitamin tablet ?Take 1 tablet by mouth daily. ?  ?rosuvastatin 5 MG tablet ?Commonly known as: CRESTOR ?Take 1 tablet (5 mg total) by mouth daily. ?  ?Synthroid 50 MCG tablet ?Generic drug: levothyroxine ?Take 1 tablet (50 mcg total) by mouth daily. ?  ?Xarelto 20 MG Tabs tablet ?Generic drug: rivaroxaban ?Take 1 tablet by mouth once daily ?  ? ?  ? ? ? ?Objective:  ? ?BP (!) 172/77   Pulse (!) 52   Ht '5\' 4"'  (1.626 m)   Wt 132 lb (59.9 kg)   SpO2 99%   BMI 22.66 kg/m?   ?Wt Readings from Last 3 Encounters:   ?01/01/22 132 lb (59.9 kg)  ?12/06/21 130 lb (59 kg)  ?09/04/21 135 lb (61.2 kg)  ?  ?Physical Exam ?Vitals and nursing note reviewed.  ?Constitutional:   ?   General: She is not in acute distress. ?   Appearance: She is well-developed. She is not diaphoretic.  ?Eyes:  ?   Conjunctiva/sclera: Conjunctivae normal.  ?Cardiovascular:  ?   Rate and Rhythm: Normal rate and regular rhythm.  ?   Heart sounds: Normal heart sounds. No murmur heard. ?Pulmonary:  ?   Effort: Pulmonary effort is normal. No respiratory distress.  ?   Breath sounds: Normal breath sounds. No wheezing.  ?Musculoskeletal:     ?   General: No swelling or tenderness. Normal range of motion.  ?Skin: ?   General: Skin is warm and dry.  ?   Findings: No rash.  ?Neurological:  ?   Mental Status: She is alert and oriented to person, place, and time.  ?   Coordination: Coordination normal.  ?Psychiatric:     ?   Behavior: Behavior normal.  ? ? ?Results for orders placed or performed in visit on 12/28/21  ?TSH  ?Result Value Ref Range  ? TSH 1.160 0.450 - 4.500 uIU/mL  ?Bayer DCA Hb A1c Waived  ?Result Value Ref Range  ? HB A1C (BAYER DCA - WAIVED) 6.3 (H) 4.8 - 5.6 %  ?Lipid panel  ?Result Value Ref Range  ? Cholesterol, Total 134 100 - 199 mg/dL  ? Triglycerides 78 0 - 149 mg/dL  ? HDL 50 >39 mg/dL  ? VLDL Cholesterol Cal 15 5 - 40 mg/dL  ? LDL Chol Calc (NIH) 69 0 - 99 mg/dL  ? Chol/HDL Ratio 2.7 0.0 - 4.4 ratio  ?CMP14+EGFR  ?Result Value Ref Range  ? Glucose 106 (H) 70 - 99 mg/dL  ? BUN 9 8 - 27 mg/dL  ? Creatinine, Ser 0.59 0.57 - 1.00 mg/dL  ? eGFR 93 >59 mL/min/1.73  ? BUN/Creatinine Ratio 15 12 - 28  ? Sodium 135 134 - 144 mmol/L  ? Potassium 4.4 3.5 - 5.2 mmol/L  ? Chloride 96 96 - 106 mmol/L  ? CO2 22 20 - 29 mmol/L  ? Calcium 9.6 8.7 - 10.3 mg/dL  ? Total Protein 7.2 6.0 - 8.5 g/dL  ? Albumin 4.4 3.7 - 4.7 g/dL  ? Globulin, Total 2.8 1.5 - 4.5 g/dL  ? Albumin/Globulin Ratio 1.6 1.2 - 2.2  ? Bilirubin Total 0.4 0.0 - 1.2 mg/dL  ? Alkaline  Phosphatase 90 44 - 121 IU/L  ? AST 18 0 - 40 IU/L  ? ALT 11 0 - 32 IU/L  ?CBC with Differential/Platelet  ?Result Value Ref Range  ? WBC 6.5 3.4 - 10.8 x10E3/uL  ? RBC 4.39 3.77 - 5.28 x10E6/uL  ? Hemoglobin 13.1 11.1 - 15.9  g/dL  ? Hematocrit 39.3 34.0 - 46.6 %  ? MCV 90 79 - 97 fL  ? MCH 29.8 26.6 - 33.0 pg  ? MCHC 33.3 31.5 - 35.7 g/dL  ? RDW 13.0 11.7 - 15.4 %  ? Platelets 256 150 - 450 x10E3/uL  ? Neutrophils 60 Not Estab. %  ? Lymphs 19 Not Estab. %  ? Monocytes 16 Not Estab. %  ? Eos 3 Not Estab. %  ? Basos 1 Not Estab. %  ? Neutrophils Absolute 4.0 1.4 - 7.0 x10E3/uL  ? Lymphocytes Absolute 1.3 0.7 - 3.1 x10E3/uL  ? Monocytes Absolute 1.0 (H) 0.1 - 0.9 x10E3/uL  ? EOS (ABSOLUTE) 0.2 0.0 - 0.4 x10E3/uL  ? Basophils Absolute 0.0 0.0 - 0.2 x10E3/uL  ? Immature Granulocytes 1 Not Estab. %  ? Immature Grans (Abs) 0.0 0.0 - 0.1 x10E3/uL  ? ? ?Assessment & Plan:  ? ?Problem List Items Addressed This Visit   ? ?  ? Cardiovascular and Mediastinum  ? HTN (hypertension) - Primary  ? Relevant Medications  ? amLODipine (NORVASC) 5 MG tablet  ? atenolol (TENORMIN) 50 MG tablet  ?  ? Endocrine  ? Hypothyroid  ? Relevant Medications  ? atenolol (TENORMIN) 50 MG tablet  ?  ? Other  ? Hyperlipemia  ? Relevant Medications  ? amLODipine (NORVASC) 5 MG tablet  ? atenolol (TENORMIN) 50 MG tablet  ?  ?Continue current medicine, blood pressure up slightly but likely because of steroid injection.  She will track it over the next couple weeks and if does not improve then we may add something for now or increase the amlodipine. ?Follow up plan: ?Return in about 4 months (around 05/03/2022), or if symptoms worsen or fail to improve, for Thyroid and hypertension and prediabetes. ? ?Counseling provided for all of the vaccine components ?No orders of the defined types were placed in this encounter. ? ? ?Caryl Pina, MD ?[place] ?01/01/2022, 9:02 AM ? ? ? ? ?

## 2022-01-28 ENCOUNTER — Telehealth: Payer: Self-pay

## 2022-01-28 NOTE — Chronic Care Management (AMB) (Signed)
?  Care Management  ? ?Outreach Note ? ?01/28/2022 ?Name: April Duke MRN: [id number] DOB: 1946/07/01 ? ?Referred by: Dettinger, Elige Radon, MD ?Reason for referral : Care Coordination (Outreach to schedule initial with RN CM from HR List ) ? ? ?An unsuccessful telephone outreach was attempted today. The patient was referred to the case management team for assistance with care management and care coordination.  ? ?Follow Up Plan:  ?The care management team will reach out to the patient again over the next 7 days.  ?If patient returns call to provider office, please advise to call Embedded Care Management Care Guide Penne Lash * at [phone]* ? ?Penne Lash, RMA ?Care Guide, Embedded Care Coordination ?[facility]  ?[place], [place] [address] ?Direct Dial: [phone] ?[email or web address] ?Website: [email or web address]  ? ?

## 2022-02-02 ENCOUNTER — Ambulatory Visit (INDEPENDENT_AMBULATORY_CARE_PROVIDER_SITE_OTHER): Payer: Medicare Other

## 2022-02-02 VITALS — Wt 132.0 lb

## 2022-02-02 DIAGNOSIS — Z0000 Encounter for general adult medical examination without abnormal findings: Secondary | ICD-10-CM

## 2022-02-02 NOTE — Patient Instructions (Signed)
April Duke , ?Thank you for taking time to come for your Medicare Wellness Visit. I appreciate your ongoing commitment to your health goals. Please review the following plan we discussed and let me know if I can assist you in the future.  ? ?Screening recommendations/referrals: ?Colonoscopy: Cologuard done 05/07/2020 - no repeat required ?Mammogram: Done 2019 - no repeat required - you may still repeat annually ?Bone Density: Done 12/27/2019 - Repeat every 2 years *due now ?Recommended yearly ophthalmology/optometry visit for glaucoma screening and checkup ?Recommended yearly dental visit for hygiene and checkup ? ?Vaccinations: ?Influenza vaccine: Due - recommended every year in fall ?Pneumococcal vaccine: Due - consider Prevnar-20 - once per lifetime ?Tdap vaccine: Done 04/16/2015 - Repeat in 10 years ?Shingles vaccine: Due - Shingrix is 2 doses 2-6 months apart and over 90% effective     ?Covid-19:Declined ? ?Advanced directives: Advance directive discussed with you today. Even though you declined this today, please call our office should you change your mind, and we can give you the proper paperwork for you to fill out.  ? ?Conditions/risks identified: Aim for 30 minutes of exercise or brisk walking, 6-8 glasses of water, and 5 servings of fruits and vegetables each day.  ? ?Next appointment: Follow up in one year for your annual wellness visit  ? ? ?Preventive Care 4 Years and Older, Female ?Preventive care refers to lifestyle choices and visits with your health care provider that can promote health and wellness. ?What does preventive care include? ?A yearly physical exam. This is also called an annual well check. ?Dental exams once or twice a year. ?Routine eye exams. Ask your health care provider how often you should have your eyes checked. ?Personal lifestyle choices, including: ?Daily care of your teeth and gums. ?Regular physical activity. ?Eating a healthy diet. ?Avoiding tobacco and drug use. ?Limiting  alcohol use. ?Practicing safe sex. ?Taking low-dose aspirin every day. ?Taking vitamin and mineral supplements as recommended by your health care provider. ?What happens during an annual well check? ?The services and screenings done by your health care provider during your annual well check will depend on your age, overall health, lifestyle risk factors, and family history of disease. ?Counseling  ?Your health care provider may ask you questions about your: ?Alcohol use. ?Tobacco use. ?Drug use. ?Emotional well-being. ?Home and relationship well-being. ?Sexual activity. ?Eating habits. ?History of falls. ?Memory and ability to understand (cognition). ?Work and work Astronomer. ?Reproductive health. ?Screening  ?You may have the following tests or measurements: ?Height, weight, and BMI. ?Blood pressure. ?Lipid and cholesterol levels. These may be checked every 5 years, or more frequently if you are over 27 years old. ?Skin check. ?Lung cancer screening. You may have this screening every year starting at age 53 if you have a 30-pack-year history of smoking and currently smoke or have quit within the past 15 years. ?Fecal occult blood test (FOBT) of the stool. You may have this test every year starting at age 49. ?Flexible sigmoidoscopy or colonoscopy. You may have a sigmoidoscopy every 5 years or a colonoscopy every 10 years starting at age 36. ?Hepatitis C blood test. ?Hepatitis B blood test. ?Sexually transmitted disease (STD) testing. ?Diabetes screening. This is done by checking your blood sugar (glucose) after you have not eaten for a while (fasting). You may have this done every 1-3 years. ?Bone density scan. This is done to screen for osteoporosis. You may have this done starting at age 73. ?Mammogram. This may be done every 1-2 years.  Talk to your health care provider about how often you should have regular mammograms. ?Talk with your health care provider about your test results, treatment options, and if  necessary, the need for more tests. ?Vaccines  ?Your health care provider may recommend certain vaccines, such as: ?Influenza vaccine. This is recommended every year. ?Tetanus, diphtheria, and acellular pertussis (Tdap, Td) vaccine. You may need a Td booster every 10 years. ?Zoster vaccine. You may need this after age 51. ?Pneumococcal 13-valent conjugate (PCV13) vaccine. One dose is recommended after age 75. ?Pneumococcal polysaccharide (PPSV23) vaccine. One dose is recommended after age 13. ?Talk to your health care provider about which screenings and vaccines you need and how often you need them. ?This information is not intended to replace advice given to you by your health care provider. Make sure you discuss any questions you have with your health care provider. ?Document Released: 11/07/2015 Document Revised: 06/30/2016 Document Reviewed: 08/12/2015 ?Elsevier Interactive Patient Education ? 2017 [place]. ? ?Fall Prevention in the Home ?Falls can cause injuries. They can happen to people of all ages. There are many things you can do to make your home safe and to help prevent falls. ?What can I do on the outside of my home? ?Regularly fix the edges of walkways and driveways and fix any cracks. ?Remove anything that might make you trip as you walk through a door, such as a raised step or threshold. ?Trim any bushes or trees on the path to your home. ?Use bright outdoor lighting. ?Clear any walking paths of anything that might make someone trip, such as rocks or tools. ?Regularly check to see if handrails are loose or broken. Make sure that both sides of any steps have handrails. ?Any raised decks and porches should have guardrails on the edges. ?Have any leaves, snow, or ice cleared regularly. ?Use sand or salt on walking paths during winter. ?Clean up any spills in your garage right away. This includes oil or grease spills. ?What can I do in the bathroom? ?Use night lights. ?Install grab bars by the toilet  and in the tub and shower. Do not use towel bars as grab bars. ?Use non-skid mats or decals in the tub or shower. ?If you need to sit down in the shower, use a plastic, non-slip stool. ?Keep the floor dry. Clean up any water that spills on the floor as soon as it happens. ?Remove soap buildup in the tub or shower regularly. ?Attach bath mats securely with double-sided non-slip rug tape. ?Do not have throw rugs and other things on the floor that can make you trip. ?What can I do in the bedroom? ?Use night lights. ?Make sure that you have a light by your bed that is easy to reach. ?Do not use any sheets or blankets that are too big for your bed. They should not hang down onto the floor. ?Have a firm chair that has side arms. You can use this for support while you get dressed. ?Do not have throw rugs and other things on the floor that can make you trip. ?What can I do in the kitchen? ?Clean up any spills right away. ?Avoid walking on wet floors. ?Keep items that you use a lot in easy-to-reach places. ?If you need to reach something above you, use a strong step stool that has a grab bar. ?Keep electrical cords out of the [place]. ?Do not use floor polish or wax that makes floors slippery. If you must use wax, use non-skid floor wax. ?  Do not have throw rugs and other things on the floor that can make you trip. ?What can I do with my stairs? ?Do not leave any items on the stairs. ?Make sure that there are handrails on both sides of the stairs and use them. Fix handrails that are broken or loose. Make sure that handrails are as long as the stairways. ?Check any carpeting to make sure that it is firmly attached to the stairs. Fix any carpet that is loose or worn. ?Avoid having throw rugs at the top or bottom of the stairs. If you do have throw rugs, attach them to the floor with carpet tape. ?Make sure that you have a light switch at the top of the stairs and the bottom of the stairs. If you do not have them, ask someone to add  them for you. ?What else can I do to help prevent falls? ?Wear shoes that: ?Do not have high heels. ?Have rubber bottoms. ?Are comfortable and fit you well. ?Are closed at the toe. Do not wear sandals. ?If y

## 2022-02-02 NOTE — Progress Notes (Signed)
? ?Subjective:  ? April Duke is a 76 y.o. female who presents for Medicare Annual (Subsequent) preventive examination. ? ?Virtual Visit via Telephone Note ? ?I connected with  April Duke on 02/02/22 at  9:00 AM EDT by telephone and verified that I am speaking with the correct person using two identifiers. ? ?Location: ?Patient: Home ?Provider: [facility] ?Persons participating in the virtual visit: patient/Nurse Health Advisor ?  ?I discussed the limitations, risks, security and privacy concerns of performing an evaluation and management service by telephone and the availability of in person appointments. The patient expressed understanding and agreed to proceed. ? ?Interactive audio and video telecommunications were attempted between this nurse and patient, however failed, due to patient having technical difficulties OR patient did not have access to video capability.  We continued and completed visit with audio only. ? ?Some vital signs may be absent or patient reported.  ? ?Janica Eldred Dionne Ano, LPN  ? ?Review of Systems    ? ?Cardiac Risk Factors include: advanced age (>16men, >61 women);dyslipidemia;hypertension;sedentary lifestyle;Other (see comment), Risk factor comments: A.Fib ? ?   ?Objective:  ?  ?Today's Vitals  ? 02/02/22 0906  ?Weight: 132 lb (59.9 kg)  ?PainSc: 6   ? ?Body mass index is 22.66 kg/m?. ? ? ?  02/02/2022  ?  9:14 AM 12/06/2021  ? 11:35 AM 01/08/2020  ?  8:20 AM  ?Advanced Directives  ?Does Patient Have a Medical Advance Directive? No No No  ?Would patient like information on creating a medical advance directive? No - Patient declined No - Patient declined No - Patient declined  ? ? ?Current Medications (verified) ?Outpatient Encounter Medications as of 02/02/2022  ?Medication Sig  ? amLODipine (NORVASC) 5 MG tablet Take 1 tablet (5 mg total) by mouth daily.  ? atenolol (TENORMIN) 50 MG tablet Take 1 tablet (50 mg total) by mouth daily.  ? calcium carbonate (OSCAL) 1500 (600 Ca) MG TABS tablet Take by  mouth 2 (two) times daily with a meal.  ? captopril (CAPOTEN) 25 MG tablet Take 1 tablet (25 mg total) by mouth 2 (two) times daily.  ? Multiple Vitamin (MULTIVITAMIN) tablet Take 1 tablet by mouth daily.  ? rosuvastatin (CRESTOR) 5 MG tablet Take 1 tablet (5 mg total) by mouth daily.  ? SYNTHROID 50 MCG tablet Take 1 tablet (50 mcg total) by mouth daily.  ? XARELTO 20 MG TABS tablet Take 1 tablet by mouth once daily  ? ?No facility-administered encounter medications on file as of 02/02/2022.  ? ? ?Allergies (verified) ?Sulfa antibiotics and Ethanol  ? ?History: ?Past Medical History:  ?Diagnosis Date  ? Atrial fibrillation ([place])   ? Hyperlipidemia   ? Hypertension   ? Osteoporosis   ? Thyroid disease   ? ?History reviewed. No pertinent surgical history. ?Family History  ?Problem Relation Age of Onset  ? Cancer Mother   ?     breast  ? Hypertension Mother   ? Hypertension Father   ? Pulmonary embolism Sister   ? Lung cancer Brother   ? Hypertension Brother   ? Hyperlipidemia Brother   ? ?Social History  ? ?Socioeconomic History  ? Marital status: Married  ?  Spouse name: Not on file  ? Number of children: 3  ? Years of education: Not on file  ? Highest education level: Not on file  ?Occupational History  ? Occupation: retired  ?Tobacco Use  ? Smoking status: Never  ? Smokeless tobacco: Never  ?Vaping Use  ? Vaping  Use: Never used  ?Substance and Sexual Activity  ? Alcohol use: Never  ? Drug use: Never  ? Sexual activity: Not on file  ?  Comment: married 56 years in 2020  ?Other Topics Concern  ? Not on file  ?Social History Narrative  ? Lives with husband.  Has 3 sons.    ? ?Social Determinants of Health  ? ?Financial Resource Strain: Low Risk   ? Difficulty of Paying Living Expenses: Not very hard  ?Food Insecurity: No Food Insecurity  ? Worried About Charity fundraiser in the Last Year: Never true  ? Ran Out of Food in the Last Year: Never true  ?Transportation Needs: No Transportation Needs  ? Lack of  Transportation (Medical): No  ? Lack of Transportation (Non-Medical): No  ?Physical Activity: Insufficiently Active  ? Days of Exercise per Week: 5 days  ? Minutes of Exercise per Session: 20 min  ?Stress: No Stress Concern Present  ? Feeling of Stress : Only a little  ?Social Connections: Socially Integrated  ? Frequency of Communication with Friends and Family: More than three times a week  ? Frequency of Social Gatherings with Friends and Family: Twice a week  ? Attends Religious Services: More than 4 times per year  ? Active Member of Clubs or Organizations: Yes  ? Attends Archivist Meetings: More than 4 times per year  ? Marital Status: Married  ? ? ?Tobacco Counseling ?Counseling given: Not Answered ? ? ?Clinical Intake: ? ?Pre-visit preparation completed: Yes ? ?Pain : 0-10 ?Pain Score: 6  ?Pain Type: Chronic pain ?Pain Location: Hip ?Pain Orientation: Right ?Pain Radiating Towards: right leg ?Pain Descriptors / Indicators: Aching, Shooting ?Pain Onset: More than a month ago ?Pain Frequency: Intermittent ? ?  ? ?BMI - recorded: 22.66 ?Nutritional Status: BMI of 19-24  Normal ?Nutritional Risks: None ?Diabetes: No ? ?How often do you need to have someone help you when you read instructions, pamphlets, or other written materials from your doctor or pharmacy?: 1 - Never ? ?Diabetic? no ? ?Interpreter Needed?: No ? ?Information entered by :: Callyn Severtson, LPN ? ? ?Activities of Daily Living ? ?  02/02/2022  ?  9:14 AM  ?In your present state of health, do you have any difficulty performing the following activities:  ?Hearing? 1  ?Comment mild  ?Vision? 0  ?Difficulty concentrating or making decisions? 0  ?Walking or climbing stairs? 1  ?Comment hurts right hip and leg  ?Dressing or bathing? 0  ?Doing errands, shopping? 0  ?Preparing Food and eating ? N  ?Using the Toilet? N  ?In the past six months, have you accidently leaked urine? Y  ?Comment mild- only if she sleeps too long, can't make it to bathroom  in time  ?Do you have problems with loss of bowel control? N  ?Managing your Medications? N  ?Managing your Finances? N  ?Housekeeping or managing your Housekeeping? N  ? ? ?Patient Care Team: ?Dettinger, Fransisca Kaufmann, MD as PCP - General (Family Medicine) ?Celestia Khat, OD (Optometry) ?Marye Round, FNP (Orthopedic Surgery) ?Minus Breeding, MD as Consulting Physician (Cardiology) ? ?Indicate any recent Medical Services you may have received from other than [facility] providers in the past year (date may be approximate). ? ?   ?Assessment:  ? This is a routine wellness examination for Mikel. ? ?Hearing/Vision screen ?Hearing Screening - Comments:: C/o mild hearing difficulties  - declines hearing aids ?Vision Screening - Comments:: Wears rx glasses for reading prn  only - up to date with routine eye exams with [facility] ? ?Dietary issues and exercise activities discussed: ?Current Exercise Habits: Home exercise routine, Type of exercise: walking, Time (Minutes): 20, Frequency (Times/Week): 5, Weekly Exercise (Minutes/Week): 100, Intensity: Mild, Exercise limited by: orthopedic condition(s);cardiac condition(s) ? ? Goals Addressed   ? ?  ?  ?  ?  ? This Visit's Progress  ?  Prevent falls   On track  ? ?  ? ?Depression Screen ? ?  02/02/2022  ?  9:13 AM 01/01/2022  ?  8:32 AM 09/04/2021  ?  8:27 AM 05/04/2021  ?  8:01 AM 01/30/2021  ?  8:51 AM 12/31/2020  ?  8:28 AM 09/01/2020  ?  8:12 AM  ?PHQ 2/9 Scores  ?PHQ - 2 Score 1 1 1  0 0 0 0  ?PHQ- 9 Score 2 2 2       ?  ?Fall Risk ? ?  02/02/2022  ?  9:08 AM 01/01/2022  ?  8:32 AM 09/04/2021  ?  8:27 AM 05/04/2021  ?  8:01 AM 01/30/2021  ?  8:53 AM  ?Fall Risk   ?Falls in the past year? 1 0 0 0 1  ?Number falls in past yr: 0    0  ?Injury with Fall? 0    1  ?Comment     [facility] in March 2022 - Fractured ribs  ?Risk for fall due to : History of fall(s);Impaired balance/gait;Orthopedic patient    Impaired balance/gait;Impaired vision;Orthopedic patient;History of fall(s)  ?Follow up Education  provided;Falls prevention discussed      ? ? ?FALL RISK PREVENTION PERTAINING TO THE HOME: ? ?Any stairs in or around the home? No  ?If so, are there any without handrails? No  ?Home free of loose throw rugs in

## 2022-02-18 NOTE — Chronic Care Management (AMB) (Signed)
?  Care Management  ? ?Outreach Note ? ?02/18/2022 ?Name: April Duke MRN: [id number] DOB: 1945/12/18 ? ?Referred by: Dettinger, Elige Radon, MD ?Reason for referral : Care Coordination (Outreach to schedule initial with RN CM from HR List ) ? ? ?A second unsuccessful telephone outreach was attempted today. The patient was referred to the case management team for assistance with care management and care coordination.  ? ?Follow Up Plan:  ?The care management team will reach out to the patient again over the next 7 days.  ?If patient returns call to provider office, please advise to call Embedded Care Management Care Guide Penne Lash * at [phone]* ? ?Penne Lash, RMA ?Care Guide, Embedded Care Coordination ?[facility]  ?[place], [place] [address] ?Direct Dial: [phone] ?[email or web address] ?Website: [email or web address]  ? ?

## 2022-03-09 NOTE — Chronic Care Management (AMB) (Signed)
?  Care Management  ? ?Outreach Note ? ?03/09/2022 ?Name: April Duke MRN: [id number] DOB: Feb 06, 1946 ? ?Referred by: Dettinger, Elige Radon, MD ?Reason for referral : Care Coordination (Outreach to schedule initial with RN CM from HR List ) ? ? ?Third unsuccessful telephone outreach was attempted today. The patient was referred to the case management team for assistance with care management and care coordination. The patient's primary care provider has been notified of our unsuccessful attempts to make or maintain contact with the patient. The care management team is pleased to engage with this patient at any time in the future should he/she be interested in assistance from the care management team.  ? ?Follow Up Plan:  ?We have been unable to make contact with the patient for follow up. The care management team is available to follow up with the patient after provider conversation with the patient regarding recommendation for care management engagement and subsequent re-referral to the care management team.  ? ?Penne Lash, RMA ?Care Guide, Embedded Care Coordination ?[facility]  ?[place], [place] [address] ?Direct Dial: [phone] ?[email or web address] ?Website: [email or web address]  ? ?

## 2022-04-28 ENCOUNTER — Other Ambulatory Visit: Payer: Medicare Other

## 2022-04-28 DIAGNOSIS — R7303 Prediabetes: Secondary | ICD-10-CM

## 2022-04-28 DIAGNOSIS — E039 Hypothyroidism, unspecified: Secondary | ICD-10-CM

## 2022-04-28 LAB — BAYER DCA HB A1C WAIVED: HB A1C (BAYER DCA - WAIVED): 6 % — ABNORMAL HIGH (ref 4.8–5.6)

## 2022-04-29 LAB — BMP8+EGFR
BUN/Creatinine Ratio: 11 — ABNORMAL LOW (ref 12–28)
BUN: 7 mg/dL — ABNORMAL LOW (ref 8–27)
CO2: 25 mmol/L (ref 20–29)
Calcium: 10.2 mg/dL (ref 8.7–10.3)
Chloride: 96 mmol/L (ref 96–106)
Creatinine, Ser: 0.66 mg/dL (ref 0.57–1.00)
Glucose: 109 mg/dL — ABNORMAL HIGH (ref 70–99)
Potassium: 4.1 mmol/L (ref 3.5–5.2)
Sodium: 134 mmol/L (ref 134–144)
eGFR: 90 mL/min/{1.73_m2} (ref >59)

## 2022-04-29 LAB — TSH: TSH: 1.54 u[IU]/mL (ref 0.450–4.500)

## 2022-05-03 ENCOUNTER — Encounter: Payer: Self-pay | Admitting: Family Medicine

## 2022-05-03 ENCOUNTER — Ambulatory Visit (INDEPENDENT_AMBULATORY_CARE_PROVIDER_SITE_OTHER): Payer: Medicare Other | Admitting: Family Medicine

## 2022-05-03 VITALS — BP 163/67 | HR 53 | Temp 97.0°F | Ht 64.0 in | Wt 128.0 lb

## 2022-05-03 DIAGNOSIS — I480 Paroxysmal atrial fibrillation: Secondary | ICD-10-CM

## 2022-05-03 DIAGNOSIS — M1611 Unilateral primary osteoarthritis, right hip: Secondary | ICD-10-CM

## 2022-05-03 DIAGNOSIS — E039 Hypothyroidism, unspecified: Secondary | ICD-10-CM | POA: Diagnosis not present

## 2022-05-03 DIAGNOSIS — R7303 Prediabetes: Secondary | ICD-10-CM

## 2022-05-03 DIAGNOSIS — E782 Mixed hyperlipidemia: Secondary | ICD-10-CM | POA: Diagnosis not present

## 2022-05-03 DIAGNOSIS — Z780 Asymptomatic menopausal state: Secondary | ICD-10-CM

## 2022-05-03 DIAGNOSIS — I10 Essential (primary) hypertension: Secondary | ICD-10-CM | POA: Diagnosis not present

## 2022-05-03 MED ORDER — SYNTHROID 50 MCG PO TABS: 50.0000 ug | ORAL_TABLET | Freq: Every day | ORAL | 3 refills | Status: DC

## 2022-05-03 MED ORDER — ROSUVASTATIN CALCIUM 5 MG PO TABS: 5.0000 mg | ORAL_TABLET | Freq: Every day | ORAL | 3 refills | Status: DC

## 2022-05-03 NOTE — Progress Notes (Signed)
BP (!) 163/67   Pulse (!) 53   Temp (!) 97 F (36.1 C)   Ht '5\' 4"'  (1.626 m)   Wt 128 lb (58.1 kg)   SpO2 100%   BMI 21.97 kg/m    Subjective:   Patient ID: April Duke, female    DOB: 01-27-1946, 76 y.o.   MRN: [id number]  HPI: April Duke is a 76 y.o. female presenting on 05/03/2022 for Medical Management of Chronic Issues, Hypertension, Hypothyroidism, Hip Pain (right), and Leg Pain (right)   HPI Hypertension Patient is currently on amlodipine and atenolol and captopril, and their blood pressure today is 163/67. Patient denies any lightheadedness or dizziness. Patient denies headaches, blurred vision, chest pains, shortness of breath, or weakness. Denies any side effects from medication and is content with current medication.   Hypothyroidism recheck Patient is coming in for thyroid recheck today as well. They deny any issues with hair changes or heat or cold problems or diarrhea or constipation. They deny any chest pain or palpitations. They are currently on levothyroxine 50 micrograms   Prediabetes Patient comes in today for recheck of his diabetes. Patient has been currently taking no medicine, diet controlled, A1c is 6.0.. Patient is currently on an ACE inhibitor/ARB. Patient has not seen an ophthalmologist this year. Patient denies any issues with their feet. 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  She has this documented before and is known.  She has tried steroid injections and Tylenol, she cannot take NSAIDs because she is on an anticoagulant.  She says it is continue to bother her and is not seeming to improve.  Even the steroid injections only helped for less than a day.  She says it hurts to walk and it hurts in that right hip down into her groin on that right hip.  Relevant past medical, surgical,  family and social history reviewed and updated as indicated. Interim medical history since our last visit reviewed. Allergies and medications reviewed and updated.  Review of Systems  Constitutional:  Negative for chills and fever.  Eyes:  Negative for visual disturbance.  Respiratory:  Negative for chest tightness and shortness of breath.   Cardiovascular:  Negative for chest pain and leg swelling.  Musculoskeletal:  Positive for arthralgias and myalgias. Negative for back pain and gait problem.  Skin:  Negative for rash.  Neurological:  Negative for light-headedness and headaches.  Psychiatric/Behavioral:  Negative for agitation and behavioral problems.   All other systems reviewed and are negative.   Per HPI unless specifically indicated above   Allergies as of 05/03/2022       Reactions   Sulfa Antibiotics Rash   Ethanol    Other reaction(s): RAPID HEARTRATE         Medication List        Accurate as of May 03, 2022  8:33 AM. If you have any questions, ask your nurse or doctor.          amLODipine 5 MG tablet Commonly known as: NORVASC Take 1 tablet (5 mg total) by mouth daily.   atenolol 50 MG tablet Commonly known as: TENORMIN Take 1 tablet (50 mg total) by mouth daily.   calcium carbonate 1500 (600 Ca) MG Tabs tablet Commonly known as: OSCAL Take by mouth 2 (two) times daily with  a meal.   captopril 25 MG tablet Commonly known as: CAPOTEN Take 1 tablet (25 mg total) by mouth 2 (two) times daily.   multivitamin tablet Take 1 tablet by mouth daily.   rosuvastatin 5 MG tablet Commonly known as: CRESTOR Take 1 tablet (5 mg total) by mouth daily.   Synthroid 50 MCG tablet Generic drug: levothyroxine Take 1 tablet (50 mcg total) by mouth daily.   Xarelto 20 MG Tabs tablet Generic drug: rivaroxaban Take 1 tablet by mouth once daily         Objective:   BP (!) 163/67   Pulse (!) 53   Temp (!) 97 F (36.1 C)   Ht '5\' 4"'  (1.626 m)   Wt 128  lb (58.1 kg)   SpO2 100%   BMI 21.97 kg/m   Wt Readings from Last 3 Encounters:  05/03/22 128 lb (58.1 kg)  02/02/22 132 lb (59.9 kg)  01/01/22 132 lb (59.9 kg)    Physical Exam Vitals and nursing note reviewed.  Constitutional:      General: She is not in acute distress.    Appearance: She is well-developed. She is not diaphoretic.  Eyes:     Conjunctiva/sclera: Conjunctivae normal.  Cardiovascular:     Rate and Rhythm: Normal rate and regular rhythm.     Heart sounds: Normal heart sounds. No murmur heard. Pulmonary:     Effort: Pulmonary effort is normal. No respiratory distress.     Breath sounds: Normal breath sounds. No wheezing.  Musculoskeletal:        General: Normal range of motion.     Right hip: Tenderness present. No bony tenderness or crepitus. Normal range of motion.  Skin:    General: Skin is warm and dry.     Findings: No rash.  Neurological:     Mental Status: She is alert and oriented to person, place, and time.     Coordination: Coordination normal.  Psychiatric:        Behavior: Behavior normal.     Results for orders placed or performed in visit on 04/28/22  [facility]  Result Value Ref Range   Glucose 109 (H) 70 - 99 mg/dL   BUN 7 (L) 8 - 27 mg/dL   Creatinine, Ser 0.66 0.57 - 1.00 mg/dL   eGFR 90 >59 mL/min/1.73   BUN/Creatinine Ratio 11 (L) 12 - 28   Sodium 134 134 - 144 mmol/L   Potassium 4.1 3.5 - 5.2 mmol/L   Chloride 96 96 - 106 mmol/L   CO2 25 20 - 29 mmol/L   Calcium 10.2 8.7 - 10.3 mg/dL  TSH  Result Value Ref Range   TSH 1.540 0.450 - 4.500 uIU/mL  Bayer DCA Hb A1c Waived  Result Value Ref Range   HB A1C (BAYER DCA - WAIVED) 6.0 (H) 4.8 - 5.6 %    Assessment & Plan:   Problem List Items Addressed This Visit       Cardiovascular and Mediastinum   HTN (hypertension)   Relevant Medications   rosuvastatin (CRESTOR) 5 MG tablet   A-fib (HCC)   Relevant Medications   rosuvastatin (CRESTOR) 5 MG tablet     Endocrine    Hypothyroid   Relevant Medications   SYNTHROID 50 MCG tablet     Musculoskeletal and Integument   Arthritis of right hip   Relevant Orders   Ambulatory referral to Orthopedic Surgery     Other   Hyperlipemia   Relevant Medications   rosuvastatin (CRESTOR)  5 MG tablet   Prediabetes   Other Visit Diagnoses     Postmenopausal    -  Primary   Relevant Orders   DG [facility] DEXA     Blood work looks great, will do referral to orthopedic.  Also recommended Voltaren gel to see if it helps.  Follow up plan: Return in about 4 months (around 09/03/2022), or if symptoms worsen or fail to improve, for Prediabetes and thyroid and hypertension recheck.  Counseling provided for all of the vaccine components Orders Placed This Encounter  Procedures   DG [facility] DEXA   Ambulatory referral to [place], MD [facility] 05/03/2022, 8:33 AM

## 2022-05-07 ENCOUNTER — Telehealth: Payer: Self-pay | Admitting: Family Medicine

## 2022-05-10 NOTE — Telephone Encounter (Signed)
Thyroid was checked and it looked good.  Cholesterol was not checked because its been running good and we will just check it twice a year from here on out.  As far as for the blood thinner her blood counts have been good in the past and she is on Xarelto so the blood counts we will check twice yearly as well.

## 2022-05-10 NOTE — Telephone Encounter (Signed)
Husband Thereasa Distance is calling to check on note he left. His number is [phone]

## 2022-05-27 ENCOUNTER — Other Ambulatory Visit: Payer: Self-pay | Admitting: Cardiology

## 2022-05-27 NOTE — Telephone Encounter (Signed)
Prescription refill request for Xarelto received.  Indication: PAF Last office visit: 06/03/21  Daiva Nakayama MD Weight: 60.8kg Age: 76 Scr: 0.66 on 04/28/22 CrCl: 68.52  Based on above findings Xarelto 20mg  daily is the appropriate dose.  Pt is due to see Dr in August.  Message sent to schedulers to make appt.  Refill approved x 1.

## 2022-07-15 ENCOUNTER — Telehealth: Payer: Self-pay | Admitting: Family Medicine

## 2022-07-15 DIAGNOSIS — M1611 Unilateral primary osteoarthritis, right hip: Secondary | ICD-10-CM

## 2022-07-15 DIAGNOSIS — Z780 Asymptomatic menopausal state: Secondary | ICD-10-CM

## 2022-07-15 NOTE — Telephone Encounter (Signed)
Na and no voice mail

## 2022-07-15 NOTE — Telephone Encounter (Signed)
It looks like she has seen Ortho, quite a few different times within the past year, is she saying that she wants to go to a different orthopedic?,  If not then she should call them back up but it does not like she was seen at [facility].

## 2022-07-15 NOTE — Telephone Encounter (Signed)
Dr. Warrick Parisian,  Are you ok to refer to ortho?

## 2022-07-15 NOTE — Telephone Encounter (Signed)
Son called back. Yes pt wants to go to a NEW ORTHO. Does not want to go back to [facility].

## 2022-07-19 NOTE — Telephone Encounter (Signed)
Placed referral to a new orthopedic for the patient

## 2022-07-22 ENCOUNTER — Telehealth: Payer: Self-pay | Admitting: Family Medicine

## 2022-08-15 NOTE — Progress Notes (Unsigned)
Cardiology Office Note   Date:  08/18/2022   ID:  April Duke, DOB [birth year], MRN [id number]  PCP:  Dettinger, Elige Radon, MD  Cardiologist:   None Referring:  Dettinger, Elige Radon, MD  Chief Complaint  Patient presents with   Atrial Fibrillation       History of Present Illness: April Duke is a 76 y.o. female who is referred by Dettinger, Elige Radon, MD for evaluation of atrial fib.    She was previously seen by Dr. Billey Chang at [facility].   The patient has been on anticoagulation for several years.  She has had cardioversion in the past.    Since I last saw her she has been okay.  She does have a slow heart rate.  However, she is not describing presyncope or syncope.  She is not describing new chest pressure, neck or arm discomfort.  She said no weight gain or edema.  She unfortunately is having a lot of hip problems and would like to have hip surgery and is being referred for consideration of this.  She is a little bit limited in her activities because she has discomfort in both legs when she ambulates.  This is from the hips down.  She is not describing claudication.   Past Medical History:  Diagnosis Date   Atrial fibrillation (HCC)    Hyperlipidemia    Hypertension    Osteoporosis    Thyroid disease     History reviewed. No pertinent surgical history.   Current Outpatient Medications  Medication Sig Dispense Refill   amLODipine (NORVASC) 5 MG tablet Take 1 tablet (5 mg total) by mouth daily. 90 tablet 3   atenolol (TENORMIN) 25 MG tablet Take 1 tablet (25 mg total) by mouth daily. 90 tablet 3   calcium carbonate (OSCAL) 1500 (600 Ca) MG TABS tablet Take by mouth 2 (two) times daily with a meal.     captopril (CAPOTEN) 50 MG tablet Take 1 tablet (50 mg total) by mouth 2 (two) times daily. 180 tablet 3   Multiple Vitamin (MULTIVITAMIN) tablet Take 1 tablet by mouth daily.     rosuvastatin (CRESTOR) 5 MG tablet Take 1 tablet (5 mg total) by mouth daily. 90 tablet 3    SYNTHROID 50 MCG tablet Take 1 tablet (50 mcg total) by mouth daily. 90 tablet 3   XARELTO 20 MG TABS tablet Take 1 tablet by mouth once daily 90 tablet 0   No current facility-administered medications for this visit.    Allergies:   Alcohol, Sulfa antibiotics, and Ethanol    ROS:  Please see the history of present illness.   Otherwise, review of systems are positive for none.   All other systems are reviewed and negative.    PHYSICAL EXAM: VS:  BP (!) 140/72   Pulse (!) 48   Ht 5\' 4"  (1.626 m)   Wt 129 lb (58.5 kg)   BMI 22.14 kg/m  , BMI Body mass index is 22.14 kg/m. GENERAL:  Well appearing NECK:  No jugular venous distention, waveform within normal limits, carotid upstroke brisk and symmetric, no bruits, no thyromegaly LUNGS:  Clear to auscultation bilaterally CHEST:  Unremarkable HEART:  PMI not displaced or sustained,S1 and S2 within normal limits, no S3,  no clicks, no rubs, no murmurs, irregular ABD:  Flat, positive bowel sounds normal in frequency in pitch, no bruits, no rebound, no guarding, no midline pulsatile mass, no hepatomegaly, no splenomegaly EXT:  2 plus pulses throughout, no  edema, no cyanosis no clubbing   EKG:  EKG is  ordered today. The ekg ordered today demonstrates atrial fibrillation, rate 48, axis within normal limits, intervals within normal limits, no acute ST-T wave changes.  Recent Labs: 12/28/2021: ALT 11; Hemoglobin 13.1; Platelets 256 04/28/2022: BUN 7; Creatinine, Ser 0.66; Potassium 4.1; Sodium 134; TSH 1.540    Lipid Panel    Component Value Date/Time   CHOL 134 12/28/2021 1435   TRIG 78 12/28/2021 1435   HDL 50 12/28/2021 1435   CHOLHDL 2.7 12/28/2021 1435   LDLCALC 69 12/28/2021 1435      Wt Readings from Last 3 Encounters:  08/18/22 129 lb (58.5 kg)  05/03/22 128 lb (58.1 kg)  02/02/22 132 lb (59.9 kg)      Other studies Reviewed: Additional studies/ records that were reviewed today includes: Labs Review of the above  records demonstrates:  Please see elsewhere in the note.     ASSESSMENT AND PLAN:  PAF:  April Duke has a CHA2DS2 - VASc score of 3.   Her heart rate is very low and I am going to reduce her atenolol to 25 mg daily.  She tolerates anticoagulation.  No change in therapy.   DYSLIPIDEMIA:   LDL was 69 with an HDL of 50.  No change in therapy.   HTN: Her blood pressure is upper limits of normal and sometimes elevated.  I am going to go ahead and increase her captopril to 50 mg twice daily.  She can keep a blood pressure diary.   Current medicines are reviewed at length with the patient today.  The patient does not have concerns regarding medicines.  The following changes have been made: As above  Labs/ tests ordered today include:   Orders Placed This Encounter  Procedures   EKG 12-Lead     Disposition:   FU with me in 12 months    Signed, Minus Breeding, MD  08/18/2022 3:46 PM    [place]

## 2022-08-18 ENCOUNTER — Ambulatory Visit (INDEPENDENT_AMBULATORY_CARE_PROVIDER_SITE_OTHER): Payer: Medicare Other | Admitting: Cardiology

## 2022-08-18 ENCOUNTER — Encounter: Payer: Self-pay | Admitting: Cardiology

## 2022-08-18 VITALS — BP 140/72 | HR 48 | Ht 64.0 in | Wt 129.0 lb

## 2022-08-18 DIAGNOSIS — E785 Hyperlipidemia, unspecified: Secondary | ICD-10-CM | POA: Diagnosis not present

## 2022-08-18 DIAGNOSIS — I480 Paroxysmal atrial fibrillation: Secondary | ICD-10-CM | POA: Diagnosis not present

## 2022-08-18 DIAGNOSIS — I10 Essential (primary) hypertension: Secondary | ICD-10-CM | POA: Diagnosis not present

## 2022-08-18 MED ORDER — CAPTOPRIL 50 MG PO TABS: 50.0000 mg | ORAL_TABLET | Freq: Two times a day (BID) | ORAL | 3 refills | Status: DC

## 2022-08-18 MED ORDER — ATENOLOL 25 MG PO TABS: 25.0000 mg | ORAL_TABLET | Freq: Every day | ORAL | 3 refills | Status: DC

## 2022-08-18 NOTE — Patient Instructions (Signed)
Medication Instructions:  Please decrease Atenolol to 25 mg once a day and increase Captopril to 50 mg one tablet twice a day. Continue all other medications as listed.  *If you need a refill on your cardiac medications before your next appointment, please call your pharmacy*  Follow-Up: At [facility], you and your health needs are our priority.  As part of our continuing mission to provide you with exceptional heart care, we have created designated Provider Care Teams.  These Care Teams include your primary Cardiologist (physician) and Advanced Practice Providers (APPs -  Physician Assistants and Nurse Practitioners) who all work together to provide you with the care you need, when you need it.  We recommend signing up for the patient portal called "MyChart".  Sign up information is provided on this After Visit Summary.  MyChart is used to connect with patients for Virtual Visits (Telemedicine).  Patients are able to view lab/test results, encounter notes, upcoming appointments, etc.  Non-urgent messages can be sent to your provider as well.   To learn more about what you can do with MyChart, go to [email or web address].    Your next appointment:   1 year(s)  The format for your next appointment:   In Person  Provider:   Minus Breeding, MD     Important Information About Sugar

## 2022-08-24 ENCOUNTER — Other Ambulatory Visit: Payer: Self-pay | Admitting: Cardiology

## 2022-08-24 DIAGNOSIS — I480 Paroxysmal atrial fibrillation: Secondary | ICD-10-CM

## 2022-08-25 NOTE — Telephone Encounter (Signed)
Xarelto 20mg  refill request received. Pt is 76 years old, weight-58.5kg, Crea-0.66 on 04/28/2022, last seen by Dr. Percival Spanish on 08/18/2022, Diagnosis-Afib, [place].92 mL/min; Dose is appropriate based on dosing criteria. Will send in refill to requested pharmacy.

## 2022-09-03 ENCOUNTER — Other Ambulatory Visit: Payer: Medicare Other

## 2022-09-03 ENCOUNTER — Other Ambulatory Visit: Payer: Self-pay

## 2022-09-03 DIAGNOSIS — R7303 Prediabetes: Secondary | ICD-10-CM

## 2022-09-03 DIAGNOSIS — E782 Mixed hyperlipidemia: Secondary | ICD-10-CM

## 2022-09-03 DIAGNOSIS — I10 Essential (primary) hypertension: Secondary | ICD-10-CM

## 2022-09-03 LAB — BAYER DCA HB A1C WAIVED: HB A1C (BAYER DCA - WAIVED): 5.9 % — ABNORMAL HIGH (ref 4.8–5.6)

## 2022-09-04 LAB — CMP14+EGFR
ALT: 12 IU/L (ref 0–32)
AST: 19 IU/L (ref 0–40)
Albumin/Globulin Ratio: 1.8 (ref 1.2–2.2)
Albumin: 4.8 g/dL (ref 3.8–4.8)
Alkaline Phosphatase: 97 IU/L (ref 44–121)
BUN/Creatinine Ratio: 13 (ref 12–28)
BUN: 8 mg/dL (ref 8–27)
Bilirubin Total: 0.4 mg/dL (ref 0.0–1.2)
CO2: 24 mmol/L (ref 20–29)
Calcium: 10 mg/dL (ref 8.7–10.3)
Chloride: 99 mmol/L (ref 96–106)
Creatinine, Ser: 0.64 mg/dL (ref 0.57–1.00)
Globulin, Total: 2.7 g/dL (ref 1.5–4.5)
Glucose: 107 mg/dL — ABNORMAL HIGH (ref 70–99)
Potassium: 3.9 mmol/L (ref 3.5–5.2)
Sodium: 138 mmol/L (ref 134–144)
Total Protein: 7.5 g/dL (ref 6.0–8.5)
eGFR: 91 mL/min/{1.73_m2} (ref >59)

## 2022-09-04 LAB — CBC WITH DIFFERENTIAL/PLATELET
Basophils Absolute: 0 10*3/uL (ref 0.0–0.2)
Basos: 1 %
EOS (ABSOLUTE): 0.1 10*3/uL (ref 0.0–0.4)
Eos: 2 %
Hematocrit: 39.8 % (ref 34.0–46.6)
Hemoglobin: 13.4 g/dL (ref 11.1–15.9)
Immature Grans (Abs): 0 10*3/uL (ref 0.0–0.1)
Immature Granulocytes: 0 %
Lymphocytes Absolute: 1.3 10*3/uL (ref 0.7–3.1)
Lymphs: 27 %
MCH: 30.9 pg (ref 26.6–33.0)
MCHC: 33.7 g/dL (ref 31.5–35.7)
MCV: 92 fL (ref 79–97)
Monocytes Absolute: 0.7 10*3/uL (ref 0.1–0.9)
Monocytes: 15 %
Neutrophils Absolute: 2.5 10*3/uL (ref 1.4–7.0)
Neutrophils: 55 %
Platelets: 222 10*3/uL (ref 150–450)
RBC: 4.34 x10E6/uL (ref 3.77–5.28)
RDW: 12.5 % (ref 11.7–15.4)
WBC: 4.7 10*3/uL (ref 3.4–10.8)

## 2022-09-04 LAB — LIPID PANEL
Chol/HDL Ratio: 2.5 ratio (ref 0.0–4.4)
Cholesterol, Total: 147 mg/dL (ref 100–199)
HDL: 58 mg/dL (ref >39)
LDL Chol Calc (NIH): 72 mg/dL (ref 0–99)
Triglycerides: 89 mg/dL (ref 0–149)
VLDL Cholesterol Cal: 17 mg/dL (ref 5–40)

## 2022-09-08 ENCOUNTER — Ambulatory Visit (INDEPENDENT_AMBULATORY_CARE_PROVIDER_SITE_OTHER): Payer: Medicare Other | Admitting: Family Medicine

## 2022-09-08 ENCOUNTER — Ambulatory Visit: Payer: Medicare Other

## 2022-09-08 ENCOUNTER — Encounter: Payer: Self-pay | Admitting: Family Medicine

## 2022-09-08 VITALS — BP 137/67 | HR 54 | Ht 64.0 in | Wt 129.0 lb

## 2022-09-08 DIAGNOSIS — I10 Essential (primary) hypertension: Secondary | ICD-10-CM | POA: Diagnosis not present

## 2022-09-08 DIAGNOSIS — E039 Hypothyroidism, unspecified: Secondary | ICD-10-CM

## 2022-09-08 DIAGNOSIS — R7303 Prediabetes: Secondary | ICD-10-CM | POA: Diagnosis not present

## 2022-09-08 DIAGNOSIS — E782 Mixed hyperlipidemia: Secondary | ICD-10-CM | POA: Diagnosis not present

## 2022-09-08 MED ORDER — DICLOFENAC SODIUM 1 % EX GEL: 2.0000 g | Freq: Four times a day (QID) | CUTANEOUS | 3 refills | Status: DC

## 2022-09-08 NOTE — Progress Notes (Signed)
BP 137/67   Pulse (!) 54   Ht _0  (1.626 m)   Wt 129 lb (58.5 kg)   SpO2 100%   BMI 22.14 kg/m    Subjective:   Patient ID: April Duke, female    DOB: [birth year], 76 y.o.   MRN: [id number]  HPI: April Duke is a 76 y.o. female presenting on 09/08/2022 for Medical Management of Chronic Issues, Hypertension, Hypothyroidism, and Atrial Fibrillation   HPI Hypothyroidism recheck Patient is coming in for thyroid recheck today as well. They deny any issues with hair changes or heat or cold problems or diarrhea or constipation. They deny any chest pain or palpitations. They are currently on levothyroxine 50 micrograms   Hyperlipidemia Patient is coming in for recheck of his hyperlipidemia. The patient is currently taking Crestor. They deny any issues with myalgias or history of liver damage from it. They deny any focal numbness or weakness or chest pain.   Hypertension and A-fib Patient is currently on amlodipine and atenolol and captopril, and their blood pressure today is 137/67. Patient denies any lightheadedness or dizziness. Patient denies headaches, blurred vision, chest pains, shortness of breath, or weakness. Denies any side effects from medication and is content with current medication.   Prediabetes Patient comes in today for recheck of his diabetes. Patient has been currently taking no medicine currently, diet control. Patient is currently on an ACE inhibitor/ARB. Patient has not seen an ophthalmologist this year. Patient denies any issues with their feet. The symptom started onset as an adult hypertension and hypothyroidism and A-fib ARE RELATED TO DM   Relevant past medical, surgical, family and social history reviewed and updated as indicated. Interim medical history since our last visit reviewed. Allergies and medications reviewed and updated.  Review of Systems  Constitutional:  Negative for chills and fever.  HENT:  Negative for congestion, ear discharge and ear pain.    Eyes:  Negative for redness and visual disturbance.  Respiratory:  Negative for chest tightness and shortness of breath.   Cardiovascular:  Negative for chest pain and leg swelling.  Genitourinary:  Negative for difficulty urinating and dysuria.  Musculoskeletal:  Positive for arthralgias and gait problem. Negative for back pain.  Skin:  Negative for rash.  Neurological:  Negative for light-headedness and headaches.  Psychiatric/Behavioral:  Negative for agitation and behavioral problems.   All other systems reviewed and are negative.   Per HPI unless specifically indicated above   Allergies as of 09/08/2022       Reactions   Alcohol Palpitations   Other reaction(s): RAPID HEARTRATE Other reaction(s): RAPID HEARTRATE Other reaction(s): RAPID HEARTRATE   Sulfa Antibiotics Rash   Ethanol    Other reaction(s): RAPID HEARTRATE         Medication List        Accurate as of September 08, 2022 10:20 AM. If you have any questions, ask your nurse or doctor.          amLODipine 5 MG tablet Commonly known as: NORVASC Take 1 tablet (5 mg total) by mouth daily.   atenolol 25 MG tablet Commonly known as: TENORMIN Take 1 tablet (25 mg total) by mouth daily.   calcium carbonate 1500 (600 Ca) MG Tabs tablet Commonly known as: OSCAL Take by mouth 2 (two) times daily with a meal.   captopril 50 MG tablet Commonly known as: CAPOTEN Take 1 tablet (50 mg total) by mouth 2 (two) times daily.   diclofenac Sodium 1 % Gel Commonly  known as: Voltaren Apply 2 g topically 4 (four) times daily. Started by: Worthy Rancher, MD   multivitamin tablet Take 1 tablet by mouth daily.   rosuvastatin 5 MG tablet Commonly known as: CRESTOR Take 1 tablet (5 mg total) by mouth daily.   Synthroid 50 MCG tablet Generic drug: levothyroxine Take 1 tablet (50 mcg total) by mouth daily.   Xarelto 20 MG Tabs tablet Generic drug: rivaroxaban Take 1 tablet (20 mg total) by mouth daily with  supper.         Objective:   BP 137/67   Pulse (!) 54   Ht _0  (1.626 m)   Wt 129 lb (58.5 kg)   SpO2 100%   BMI 22.14 kg/m   Wt Readings from Last 3 Encounters:  09/08/22 129 lb (58.5 kg)  08/18/22 129 lb (58.5 kg)  05/03/22 128 lb (58.1 kg)    Physical Exam Vitals and nursing note reviewed.  Constitutional:      General: She is not in acute distress.    Appearance: She is well-developed. She is not diaphoretic.  Eyes:     Conjunctiva/sclera: Conjunctivae normal.  Cardiovascular:     Rate and Rhythm: Normal rate and regular rhythm.     Heart sounds: Normal heart sounds. No murmur heard. Pulmonary:     Effort: Pulmonary effort is normal. No respiratory distress.     Breath sounds: Normal breath sounds. No wheezing.  Musculoskeletal:        General: No swelling or tenderness. Normal range of motion.  Skin:    General: Skin is warm and dry.     Findings: No rash.  Neurological:     Mental Status: She is alert and oriented to person, place, and time.     Coordination: Coordination normal.  Psychiatric:        Behavior: Behavior normal.     Results for orders placed or performed in visit on 09/03/22  Bayer DCA Hb A1c Waived  Result Value Ref Range   HB A1C (BAYER DCA - WAIVED) 5.9 (H) 4.8 - 5.6 %  Lipid panel  Result Value Ref Range   Cholesterol, Total 147 100 - 199 mg/dL   Triglycerides 89 0 - 149 mg/dL   HDL 58 >39 mg/dL   VLDL Cholesterol Cal 17 5 - 40 mg/dL   LDL Chol Calc (NIH) 72 0 - 99 mg/dL   Chol/HDL Ratio 2.5 0.0 - 4.4 ratio  CMP14+EGFR  Result Value Ref Range   Glucose 107 (H) 70 - 99 mg/dL   BUN 8 8 - 27 mg/dL   Creatinine, Ser 0.64 0.57 - 1.00 mg/dL   eGFR 91 >59 mL/min/1.73   BUN/Creatinine Ratio 13 12 - 28   Sodium 138 134 - 144 mmol/L   Potassium 3.9 3.5 - 5.2 mmol/L   Chloride 99 96 - 106 mmol/L   CO2 24 20 - 29 mmol/L   Calcium 10.0 8.7 - 10.3 mg/dL   Total Protein 7.5 6.0 - 8.5 g/dL   Albumin 4.8 3.8 - 4.8 g/dL   Globulin,  Total 2.7 1.5 - 4.5 g/dL   Albumin/Globulin Ratio 1.8 1.2 - 2.2   Bilirubin Total 0.4 0.0 - 1.2 mg/dL   Alkaline Phosphatase 97 44 - 121 IU/L   AST 19 0 - 40 IU/L   ALT 12 0 - 32 IU/L  CBC with Differential/Platelet  Result Value Ref Range   WBC 4.7 3.4 - 10.8 x10E3/uL   RBC 4.34 3.77 - 5.28  x10E6/uL   Hemoglobin 13.4 11.1 - 15.9 g/dL   Hematocrit 39.8 34.0 - 46.6 %   MCV 92 79 - 97 fL   MCH 30.9 26.6 - 33.0 pg   MCHC 33.7 31.5 - 35.7 g/dL   RDW 12.5 11.7 - 15.4 %   Platelets 222 150 - 450 x10E3/uL   Neutrophils 55 Not Estab. %   Lymphs 27 Not Estab. %   Monocytes 15 Not Estab. %   Eos 2 Not Estab. %   Basos 1 Not Estab. %   Neutrophils Absolute 2.5 1.4 - 7.0 x10E3/uL   Lymphocytes Absolute 1.3 0.7 - 3.1 x10E3/uL   Monocytes Absolute 0.7 0.1 - 0.9 x10E3/uL   EOS (ABSOLUTE) 0.1 0.0 - 0.4 x10E3/uL   Basophils Absolute 0.0 0.0 - 0.2 x10E3/uL   Immature Granulocytes 0 Not Estab. %   Immature Grans (Abs) 0.0 0.0 - 0.1 x10E3/uL    Assessment & Plan:   Problem List Items Addressed This Visit       Cardiovascular and Mediastinum   HTN (hypertension) - Primary     Endocrine   Hypothyroid     Other   Hyperlipemia   Prediabetes    Blood work looks good, continue current medicine.  No changes. Follow up plan: Return in about 4 months (around 01/07/2023), or if symptoms worsen or fail to improve, for Prediabetes hypothyroidism and hypertension.  Counseling provided for all of the vaccine components No orders of the defined types were placed in this encounter.   Caryl Pina, MD [facility] 09/08/2022, 10:20 AM

## 2022-10-14 ENCOUNTER — Telehealth: Payer: Self-pay

## 2022-10-14 NOTE — Telephone Encounter (Signed)
Faxed surgical clearance form, ov nots, labs and EKG to [facility]. [phone].

## 2022-11-10 NOTE — Progress Notes (Addendum)
COVID Vaccine Completed: no  Date of COVID positive in last 90 days: no  PCP - Caryl Pina, MD Cardiologist - Minus Breeding, MD  Chest x-ray - n/a EKG - 08/18/22 Epic Stress Test - n/a  ECHO - yes, pt cannot remember when Cardiac Cath - n/a Pacemaker/ICD device last checked: n/a Spinal Cord Stimulator: n/a  Bowel Prep - no  Sleep Study - n/a CPAP -   Fasting Blood Sugar - pre DM Checks Blood Sugar _____ times a day  Last dose of GLP1 agonist-  N/A GLP1 instructions:  N/A   Last dose of SGLT-2 inhibitors-  N/A SGLT-2 instructions: N/A   Blood Thinner Instructions: Xarelto, hold 3 days before surgery  Aspirin Instructions: Last Dose: 11/19/22 1800  Activity level: Can perform activities of daily living without stopping and without symptoms of chest pain or shortness of breath. No stairs due to hips.     Anesthesia review: a fib, HTN, needs clearance   Patient denies shortness of breath, fever, cough and chest pain at PAT appointment  Patient verbalized understanding of instructions that were given to them at the PAT appointment. Patient was also instructed that they will need to review over the PAT instructions again at home before surgery.

## 2022-11-11 ENCOUNTER — Encounter (HOSPITAL_COMMUNITY): Payer: Self-pay

## 2022-11-11 ENCOUNTER — Encounter (HOSPITAL_COMMUNITY)
Admission: RE | Admit: 2022-11-11 | Discharge: 2022-11-11 | Disposition: A | Discharge status: Home / Self Care | Payer: Medicare Other | Source: Ambulatory Visit | Attending: Orthopedic Surgery | Admitting: Orthopedic Surgery

## 2022-11-11 VITALS — BP 136/70 | HR 71 | Temp 97.6°F | Resp 12 | Ht 64.0 in | Wt 130.0 lb

## 2022-11-11 DIAGNOSIS — E039 Hypothyroidism, unspecified: Secondary | ICD-10-CM | POA: Insufficient documentation

## 2022-11-11 DIAGNOSIS — M1611 Unilateral primary osteoarthritis, right hip: Secondary | ICD-10-CM | POA: Insufficient documentation

## 2022-11-11 DIAGNOSIS — I4891 Unspecified atrial fibrillation: Secondary | ICD-10-CM | POA: Insufficient documentation

## 2022-11-11 DIAGNOSIS — I10 Essential (primary) hypertension: Secondary | ICD-10-CM

## 2022-11-11 DIAGNOSIS — R7303 Prediabetes: Secondary | ICD-10-CM | POA: Diagnosis not present

## 2022-11-11 DIAGNOSIS — Z01818 Encounter for other preprocedural examination: Secondary | ICD-10-CM

## 2022-11-11 DIAGNOSIS — Z01812 Encounter for preprocedural laboratory examination: Secondary | ICD-10-CM | POA: Diagnosis not present

## 2022-11-11 HISTORY — DX: Hypothyroidism, unspecified: E03.9

## 2022-11-11 HISTORY — DX: Pneumonia, unspecified organism: J18.9

## 2022-11-11 LAB — CBC
HCT: 38.2 % (ref 36.0–46.0)
Hemoglobin: 12.7 g/dL (ref 12.0–15.0)
MCH: 30.4 pg (ref 26.0–34.0)
MCHC: 33.2 g/dL (ref 30.0–36.0)
MCV: 91.4 fL (ref 80.0–100.0)
Platelets: 289 10*3/uL (ref 150–400)
RBC: 4.18 MIL/uL (ref 3.87–5.11)
RDW: 13 % (ref 11.5–15.5)
WBC: 5.7 10*3/uL (ref 4.0–10.5)
nRBC: 0 % (ref 0.0–0.2)

## 2022-11-11 LAB — TYPE AND SCREEN
ABO/RH(D): A POS
Antibody Screen: NEGATIVE

## 2022-11-11 LAB — BASIC METABOLIC PANEL
Anion gap: 12 (ref 5–15)
BUN: 11 mg/dL (ref 8–23)
CO2: 26 mmol/L (ref 22–32)
Calcium: 9.9 mg/dL (ref 8.9–10.3)
Chloride: 100 mmol/L (ref 98–111)
Creatinine, Ser: 0.47 mg/dL (ref 0.44–1.00)
GFR, Estimated: >60 mL/min (ref >60)
Glucose, Bld: 106 mg/dL — ABNORMAL HIGH (ref 70–99)
Potassium: 4.2 mmol/L (ref 3.5–5.1)
Sodium: 138 mmol/L (ref 135–145)

## 2022-11-11 LAB — HEMOGLOBIN A1C
Hgb A1c MFr Bld: 6 % — ABNORMAL HIGH (ref 4.8–5.6)
Mean Plasma Glucose: 125.5 mg/dL

## 2022-11-11 LAB — SURGICAL PCR SCREEN
MRSA, PCR: NEGATIVE
Staphylococcus aureus: NEGATIVE

## 2022-11-11 LAB — GLUCOSE, CAPILLARY: Glucose-Capillary: 127 mg/dL — ABNORMAL HIGH (ref 70–99)

## 2022-11-11 NOTE — Patient Instructions (Signed)
SURGICAL WAITING ROOM VISITATION  Patients having surgery or a procedure may have no more than 2 support people in the waiting area - these visitors may rotate.    Children under the age of 3 must have an adult with them who is not the patient.  Due to an increase in RSV and influenza rates and associated hospitalizations, children ages 20 and under may not visit patients in [place].  If the patient needs to stay at the hospital during part of their recovery, the visitor guidelines for inpatient rooms apply. Pre-op nurse will coordinate an appropriate time for 1 support person to accompany patient in pre-op.  This support person may not rotate.    Please refer to the [facility] website for the visitor guidelines for Inpatients (after your surgery is over and you are in a regular room).    Your procedure is scheduled on: 11/23/22   Report to [facility] Main Entrance    Report to admitting at 9:00 AM   Call this number if you have problems the morning of surgery [phone]   Do not eat food :After Midnight.   After Midnight you may have the following liquids until 8:30 AM DAY OF SURGERY  Water Non-Citrus Juices (without pulp, NO RED-Apple, White grape, White cranberry) Black Coffee (NO MILK/CREAM OR CREAMERS, sugar ok)  Clear Tea (NO MILK/CREAM OR CREAMERS, sugar ok) regular and decaf                             Plain Jell-O (NO RED)                                           Fruit ices (not with fruit pulp, NO RED)                                     Popsicles (NO RED)                                                               Sports drinks like Gatorade (NO RED)                 The day of surgery:  Drink ONE (1) Pre-Surgery G2 at 8:30 AM the morning of surgery. Drink in one sitting. Do not sip.  This drink was given to you during your hospital  pre-op appointment visit. Nothing else to drink after completing the  Pre-Surgery G2.          If you  have questions, please contact your surgeon's office.   FOLLOW BOWEL PREP AND ANY ADDITIONAL PRE OP INSTRUCTIONS YOU RECEIVED FROM YOUR SURGEON'S OFFICE!!!     Oral Hygiene is also important to reduce your risk of infection.                                    Remember - BRUSH YOUR TEETH THE MORNING OF SURGERY WITH YOUR REGULAR TOOTHPASTE  DENTURES WILL BE REMOVED  PRIOR TO SURGERY PLEASE DO NOT APPLY "Poly grip" OR ADHESIVES!!!   Take these medicines the morning of surgery with A SIP OF WATER: Tylenol, Amlodipine, Atenolol, Rosuvastatin, Synthroid                               You may not have any metal on your body including hair pins, jewelry, and body piercing             Do not wear make-up, lotions, powders, perfumes, or deodorant  Do not wear nail polish including gel and S&S, artificial/acrylic nails, or any other type of covering on natural nails including finger and toenails. If you have artificial nails, gel coating, etc. that needs to be removed by a nail salon please have this removed prior to surgery or surgery may need to be canceled/ delayed if the surgeon/ anesthesia feels like they are unable to be safely monitored.   Do not shave  48 hours prior to surgery.    Do not bring valuables to the hospital. [place].   Contacts, glasses, dentures or bridgework may not be worn into surgery.   Bring small overnight bag day of surgery.   DO NOT [place]. PHARMACY WILL DISPENSE MEDICATIONS LISTED ON YOUR MEDICATION LIST TO YOU DURING YOUR ADMISSION [place]!              Please read over the following fact sheets you were given: IF [place] [phone] Schneiders   If you received a COVID test during your pre-op visit  it is requested that you wear a mask when out in public, stay away from anyone that may not be feeling well and notify  your surgeon if you develop symptoms. If you test positive for Covid or have been in contact with anyone that has tested positive in the last 10 days please notify you surgeon.    [facility] - Preparing for Surgery Before surgery, you can play an important role.  Because skin is not sterile, your skin needs to be as free of germs as possible.  You can reduce the number of germs on your skin by washing with CHG (chlorahexidine gluconate) soap before surgery.  CHG is an antiseptic cleaner which kills germs and bonds with the skin to continue killing germs even after washing. Please DO NOT use if you have an allergy to CHG or antibacterial soaps.  If your skin becomes reddened/irritated stop using the CHG and inform your nurse when you arrive at Short Stay. Do not shave (including legs and underarms) for at least 48 hours prior to the first CHG shower.  You may shave your face/neck.  Please follow these instructions carefully:  1.  Shower with CHG Soap the night before surgery and the  morning of surgery.  2.  If you choose to wash your hair, wash your hair first as usual with your normal  shampoo.  3.  After you shampoo, rinse your hair and body thoroughly to remove the shampoo.                             4.  Use CHG as you would any other liquid soap.  You can apply  chg directly to the skin and wash.  Gently with a scrungie or clean washcloth.  5.  Apply the CHG Soap to your body ONLY FROM THE NECK DOWN.   Do   not use on face/ open                           Wound or open sores. Avoid contact with eyes, ears mouth and   genitals (private parts).                       Wash face,  Genitals (private parts) with your normal soap.             6.  Wash thoroughly, paying special attention to the area where your    surgery  will be performed.  7.  Thoroughly rinse your body with warm water from the neck down.  8.  DO NOT shower/wash with your normal soap after using and rinsing off the CHG Soap.                 9.  Pat yourself dry with a clean towel.            10.  Wear clean pajamas.            11.  Place clean sheets on your bed the night of your first shower and do not  sleep with pets. Day of Surgery : Do not apply any lotions/deodorants the morning of surgery.  Please wear clean clothes to the hospital/surgery center.  FAILURE TO FOLLOW THESE INSTRUCTIONS MAY RESULT IN THE CANCELLATION OF YOUR SURGERY  PATIENT SIGNATURE_________________________________  NURSE SIGNATURE__________________________________  ________________________________________________________________________  April Duke  An incentive spirometer is a tool that can help keep your lungs clear and active. This tool measures how well you are filling your lungs with each breath. Taking long deep breaths may help reverse or decrease the chance of developing breathing (pulmonary) problems (especially infection) following: A long period of time when you are unable to move or be active. BEFORE THE PROCEDURE  If the spirometer includes an indicator to show your best effort, your nurse or respiratory therapist will set it to a desired goal. If possible, sit up straight or lean slightly forward. Try not to slouch. Hold the incentive spirometer in an upright position. INSTRUCTIONS FOR USE  Sit on the edge of your bed if possible, or sit up as far as you can in bed or on a chair. Hold the incentive spirometer in an upright position. Breathe out normally. Place the mouthpiece in your mouth and seal your lips tightly around it. Breathe in slowly and as deeply as possible, raising the piston or the ball toward the top of the column. Hold your breath for 3-5 seconds or for as long as possible. Allow the piston or ball to fall to the bottom of the column. Remove the mouthpiece from your mouth and breathe out normally. Rest for a few seconds and repeat Steps 1 through 7 at least 10 times every 1-2 hours when you are awake.  Take your time and take a few normal breaths between deep breaths. The spirometer may include an indicator to show your best effort. Use the indicator as a goal to work toward during each repetition. After each set of 10 deep breaths, practice coughing to be sure your lungs are clear. If you have an incision (the cut made at the time of  surgery), support your incision when coughing by placing a pillow or rolled up towels firmly against it. Once you are able to get out of bed, walk around indoors and cough well. You may stop using the incentive spirometer when instructed by your caregiver.  RISKS AND COMPLICATIONS Take your time so you do not get dizzy or light-headed. If you are in pain, you may need to take or ask for pain medication before doing incentive spirometry. It is harder to take a deep breath if you are having pain. AFTER USE Rest and breathe slowly and easily. It can be helpful to keep track of a log of your progress. Your caregiver can provide you with a simple table to help with this. If you are using the spirometer at home, follow these instructions: [place] IF:  You are having difficultly using the spirometer. You have trouble using the spirometer as often as instructed. Your pain medication is not giving enough relief while using the spirometer. You develop fever of 100.5 F (38.1 C) or higher. SEEK IMMEDIATE MEDICAL CARE IF:  You cough up bloody sputum that had not been present before. You develop fever of 102 F (38.9 C) or greater. You develop worsening pain at or near the incision site. MAKE SURE YOU:  Understand these instructions. Will watch your condition. Will get help right away if you are not doing well or get worse. Document Released: 02/21/2007 Document Revised: 01/03/2012 Document Reviewed: 04/24/2007 ExitCare Patient Information 2014 ExitCare, [place].   ________________________________________________________________________ WHAT IS A BLOOD  TRANSFUSION? Blood Transfusion Information  A transfusion is the replacement of blood or some of its parts. Blood is made up of multiple cells which provide different functions. Red blood cells carry oxygen and are used for blood loss replacement. White blood cells fight against infection. Platelets control bleeding. Plasma helps clot blood. Other blood products are available for specialized needs, such as hemophilia or other clotting disorders. BEFORE THE TRANSFUSION  Who gives blood for transfusions?  Healthy volunteers who are fully evaluated to make sure their blood is safe. This is blood bank blood. Transfusion therapy is the safest it has ever been in the practice of medicine. Before blood is taken from a donor, a complete history is taken to make sure that person has no history of diseases nor engages in risky social behavior (examples are intravenous drug use or sexual activity with multiple partners). The donor's travel history is screened to minimize risk of transmitting infections, such as malaria. The donated blood is tested for signs of infectious diseases, such as HIV and hepatitis. The blood is then tested to be sure it is compatible with you in order to minimize the chance of a transfusion reaction. If you or a relative donates blood, this is often done in anticipation of surgery and is not appropriate for emergency situations. It takes many days to process the donated blood. RISKS AND COMPLICATIONS Although transfusion therapy is very safe and saves many lives, the main dangers of transfusion include:  Getting an infectious disease. Developing a transfusion reaction. This is an allergic reaction to something in the blood you were given. Every precaution is taken to prevent this. The decision to have a blood transfusion has been considered carefully by your caregiver before blood is given. Blood is not given unless the benefits outweigh the risks. AFTER THE TRANSFUSION Right after  receiving a blood transfusion, you will usually feel much better and more energetic. This is especially true if your red  blood cells have gotten low (anemic). The transfusion raises the level of the red blood cells which carry oxygen, and this usually causes an energy increase. The nurse administering the transfusion will monitor you carefully for complications. HOME CARE INSTRUCTIONS  No special instructions are needed after a transfusion. You may find your energy is better. Speak with your caregiver about any limitations on activity for underlying diseases you may have. SEEK MEDICAL CARE IF:  Your condition is not improving after your transfusion. You develop redness or irritation at the intravenous (IV) site. SEEK IMMEDIATE MEDICAL CARE IF:  Any of the following symptoms occur over the next 12 hours: Shaking chills. You have a temperature by mouth above 102 F (38.9 C), not controlled by medicine. Chest, back, or muscle pain. People around you feel you are not acting correctly or are confused. Shortness of breath or difficulty breathing. Dizziness and fainting. You get a rash or develop hives. You have a decrease in urine output. Your urine turns a dark color or changes to pink, red, or brown. Any of the following symptoms occur over the next 10 days: You have a temperature by mouth above 102 F (38.9 C), not controlled by medicine. Shortness of breath. Weakness after normal activity. The white part of the eye turns yellow (jaundice). You have a decrease in the amount of urine or are urinating less often. Your urine turns a dark color or changes to pink, red, or brown. Document Released: 10/08/2000 Document Revised: 01/03/2012 Document Reviewed: 05/27/2008 [facility] Patient Information 2014 [place], [place].  _______________________________________________________________________

## 2022-11-12 NOTE — Progress Notes (Signed)
Anesthesia Chart Review   Case: [id number] Date/Time: 11/23/22 1115   Procedure: TOTAL HIP ARTHROPLASTY ANTERIOR APPROACH (Right: Hip)   Anesthesia type: Spinal   Pre-op diagnosis: Right hip osteoarthritis   Location: [facility] ROOM 10 / [facility]   Surgeons: Paralee Cancel, MD       DISCUSSION:77 y.o. never smoker with h/o HTN, hypothyroidism, atrial fibrillation, right hip OA scheduled for above procedure 11/23/2022 with Dr. Paralee Cancel.   Pt last seen by PCP 09/08/2022.   Pt last seen by cardiology 08/18/2022. Stable at this visit.    Pt last dose of Xarelto 11/19/2022 at 1800.   Anticipate pt can proceed with planned procedure barring acute status change.   VS: BP 136/70   Pulse 71   Temp 36.4 C (Oral)   Resp 12   Ht 5\' 4"  (1.626 m)   Wt 59 kg   SpO2 100%   BMI 22.31 kg/m   PROVIDERS: Dettinger, Fransisca Kaufmann, MD is PCP   Minus Breeding, MD is Cardiologist  LABS: Labs reviewed: Acceptable for surgery. (all labs ordered are listed, but only abnormal results are displayed)  Labs Reviewed  HEMOGLOBIN A1C - Abnormal; Notable for the following components:      Result Value   Hgb A1c MFr Bld 6.0 (*)    All other components within normal limits  BASIC METABOLIC PANEL - Abnormal; Notable for the following components:   Glucose, Bld 106 (*)    All other components within normal limits  GLUCOSE, CAPILLARY - Abnormal; Notable for the following components:   Glucose-Capillary 127 (*)    All other components within normal limits  SURGICAL PCR SCREEN  CBC  TYPE AND SCREEN     IMAGES:   EKG:   CV:  Past Medical History:  Diagnosis Date   Atrial fibrillation (HCC)    Hyperlipidemia    Hypertension    Hypothyroidism    Osteoporosis    Pneumonia    Thyroid disease     Past Surgical History:  Procedure Laterality Date   CARDIOVERSION      MEDICATIONS:  acetaminophen (TYLENOL) 500 MG tablet   amLODipine (NORVASC) 5 MG tablet   atenolol (TENORMIN) 25 MG tablet    calcium carbonate (OSCAL) 1500 (600 Ca) MG TABS tablet   captopril (CAPOTEN) 50 MG tablet   diclofenac Sodium (VOLTAREN) 1 % GEL   Multiple Vitamin (MULTIVITAMIN) tablet   rosuvastatin (CRESTOR) 5 MG tablet   SYNTHROID 50 MCG tablet   XARELTO 20 MG TABS tablet   No current facility-administered medications for this encounter.    Konrad Felix Ward, PA-C [facility] [phone]

## 2022-11-22 NOTE — Anesthesia Preprocedure Evaluation (Signed)
Anesthesia Evaluation  Patient identified by MRN, date of birth, ID band Patient awake    Reviewed: Allergy & Precautions, NPO status , Patient's Chart, lab work & pertinent test results  History of Anesthesia Complications Negative for: history of anesthetic complications  Airway Mallampati: III  TM Distance: >3 FB Neck ROM: Full    Dental  (+) Dental Advisory Given, Upper Dentures, Partial Lower   Pulmonary neg pulmonary ROS   Pulmonary exam normal breath sounds clear to auscultation       Cardiovascular hypertension (amlodipine, atenolol, captopril), Pt. on medications and Pt. on home beta blockers (-) angina (-) Past MI, (-) Cardiac Stents and (-) CABG + dysrhythmias Atrial Fibrillation  Rhythm:Irregular Rate:Normal  HLD   Neuro/Psych negative neurological ROS     GI/Hepatic Neg liver ROS,GERD  ,,  Endo/Other  Hypothyroidism  Pre-diabetes  Renal/GU negative Renal ROS     Musculoskeletal  (+) Arthritis , Osteoarthritis,  Osteoporosis    Abdominal   Peds  Hematology negative hematology ROS (+)   Anesthesia Other Findings Last Xarelto: 11/19/2022  Platelets 289  Reproductive/Obstetrics                              Anesthesia Physical Anesthesia Plan  ASA: 3  Anesthesia Plan: Spinal and MAC   Post-op Pain Management: Tylenol PO (pre-op)*   Induction: Intravenous  PONV Risk Score and Plan: 2 and Ondansetron, Dexamethasone, Propofol infusion and Treatment may vary due to age or medical condition  Airway Management Planned: Natural Airway and Simple Face Mask  Additional Equipment:   Intra-op Plan:   Post-operative Plan:   Informed Consent: I have reviewed the patients History and Physical, chart, labs and discussed the procedure including the risks, benefits and alternatives for the proposed anesthesia with the patient or authorized representative who has indicated his/her  understanding and acceptance.     Dental advisory given  Plan Discussed with: CRNA and Anesthesiologist  Anesthesia Plan Comments: (I have discussed risks of neuraxial anesthesia including but not limited to infection, bleeding, nerve injury, back pain, headache, seizures, and failure of block. Patient denies bleeding disorders and is not currently anticoagulated. Labs have been reviewed. Risks and benefits discussed. All patient's questions answered.   Discussed with patient risks of MAC including, but not limited to, minor pain or discomfort, hearing people in the room, and possible need for backup general anesthesia. Risks for general anesthesia also discussed including, but not limited to, sore throat, hoarse voice, chipped/damaged teeth, injury to vocal cords, nausea and vomiting, allergic reactions, lung infection, heart attack, stroke, and death. All questions answered. )         Anesthesia Quick Evaluation

## 2022-11-23 ENCOUNTER — Ambulatory Visit (HOSPITAL_COMMUNITY): Payer: Medicare Other

## 2022-11-23 ENCOUNTER — Observation Stay (HOSPITAL_COMMUNITY): Payer: Medicare Other

## 2022-11-23 ENCOUNTER — Ambulatory Visit (HOSPITAL_COMMUNITY): Payer: Medicare Other | Admitting: Physician Assistant

## 2022-11-23 ENCOUNTER — Encounter (HOSPITAL_COMMUNITY)
Admission: RE | Disposition: A | Discharge status: Home / Self Care | Payer: Self-pay | Source: Home / Self Care | Attending: Orthopedic Surgery

## 2022-11-23 ENCOUNTER — Ambulatory Visit (HOSPITAL_BASED_OUTPATIENT_CLINIC_OR_DEPARTMENT_OTHER): Payer: Medicare Other | Admitting: Anesthesiology

## 2022-11-23 ENCOUNTER — Other Ambulatory Visit: Payer: Self-pay

## 2022-11-23 ENCOUNTER — Observation Stay (HOSPITAL_COMMUNITY)
Admission: RE | Admit: 2022-11-23 | Discharge: 2022-11-24 | Disposition: A | Discharge status: Home / Self Care | Payer: Medicare Other | Attending: Orthopedic Surgery | Admitting: Orthopedic Surgery

## 2022-11-23 ENCOUNTER — Encounter (HOSPITAL_COMMUNITY): Payer: Self-pay | Admitting: Orthopedic Surgery

## 2022-11-23 DIAGNOSIS — I4891 Unspecified atrial fibrillation: Secondary | ICD-10-CM

## 2022-11-23 DIAGNOSIS — I10 Essential (primary) hypertension: Secondary | ICD-10-CM | POA: Diagnosis not present

## 2022-11-23 DIAGNOSIS — M1611 Unilateral primary osteoarthritis, right hip: Principal | ICD-10-CM | POA: Insufficient documentation

## 2022-11-23 DIAGNOSIS — Z79899 Other long term (current) drug therapy: Secondary | ICD-10-CM | POA: Diagnosis not present

## 2022-11-23 DIAGNOSIS — E039 Hypothyroidism, unspecified: Secondary | ICD-10-CM | POA: Diagnosis not present

## 2022-11-23 DIAGNOSIS — Z7901 Long term (current) use of anticoagulants: Secondary | ICD-10-CM | POA: Diagnosis not present

## 2022-11-23 DIAGNOSIS — Z96641 Presence of right artificial hip joint: Secondary | ICD-10-CM

## 2022-11-23 DIAGNOSIS — R7303 Prediabetes: Secondary | ICD-10-CM

## 2022-11-23 HISTORY — PX: TOTAL HIP ARTHROPLASTY: SHX124

## 2022-11-23 LAB — ABO/RH: ABO/RH(D): A POS

## 2022-11-23 SURGERY — ARTHROPLASTY, HIP, TOTAL, ANTERIOR APPROACH
Anesthesia: Monitor Anesthesia Care | Site: Hip | Laterality: Right

## 2022-11-23 MED ORDER — METOCLOPRAMIDE HCL 5 MG PO TABS: 5.0000 mg | ORAL_TABLET | Freq: Three times a day (TID) | ORAL | Status: DC | PRN

## 2022-11-23 MED ORDER — PHENOL 1.4 % MT LIQD: 1.0000 | OROMUCOSAL | Status: DC | PRN

## 2022-11-23 MED ORDER — METOCLOPRAMIDE HCL 5 MG/ML IJ SOLN: 5.0000 mg | Freq: Three times a day (TID) | INTRAMUSCULAR | Status: DC | PRN

## 2022-11-23 MED ORDER — SODIUM CHLORIDE (PF) 0.9 % IJ SOLN
INTRAMUSCULAR | Status: AC
  Filled 2022-11-23: qty 30

## 2022-11-23 MED ORDER — MENTHOL 3 MG MT LOZG: 1.0000 | LOZENGE | OROMUCOSAL | Status: DC | PRN

## 2022-11-23 MED ORDER — DIPHENHYDRAMINE HCL 12.5 MG/5ML PO ELIX: 12.5000 mg | ORAL_SOLUTION | ORAL | Status: DC | PRN

## 2022-11-23 MED ORDER — ONDANSETRON HCL 4 MG/2ML IJ SOLN: 4.0000 mg | Freq: Four times a day (QID) | INTRAMUSCULAR | Status: DC | PRN

## 2022-11-23 MED ORDER — ONDANSETRON HCL 4 MG PO TABS: 4.0000 mg | ORAL_TABLET | Freq: Four times a day (QID) | ORAL | Status: DC | PRN

## 2022-11-23 MED ORDER — ROSUVASTATIN CALCIUM 5 MG PO TABS
5.0000 mg | ORAL_TABLET | Freq: Every day | ORAL | Status: DC
  Administered 2022-11-24: 5 mg via ORAL
  Filled 2022-11-23: qty 1

## 2022-11-23 MED ORDER — DEXAMETHASONE SODIUM PHOSPHATE 10 MG/ML IJ SOLN
8.0000 mg | Freq: Once | INTRAMUSCULAR | Status: AC
  Administered 2022-11-23: 8 mg via INTRAVENOUS

## 2022-11-23 MED ORDER — TRANEXAMIC ACID-NACL 1000-0.7 MG/100ML-% IV SOLN
1000.0000 mg | INTRAVENOUS | Status: AC
  Administered 2022-11-23: 1000 mg via INTRAVENOUS
  Filled 2022-11-23: qty 100

## 2022-11-23 MED ORDER — METHOCARBAMOL 500 MG PO TABS: 500.0000 mg | ORAL_TABLET | Freq: Four times a day (QID) | ORAL | Status: DC | PRN

## 2022-11-23 MED ORDER — CEFAZOLIN SODIUM-DEXTROSE 2-4 GM/100ML-% IV SOLN
2.0000 g | INTRAVENOUS | Status: AC
  Administered 2022-11-23: 2 g via INTRAVENOUS
  Filled 2022-11-23: qty 100

## 2022-11-23 MED ORDER — KETOROLAC TROMETHAMINE 30 MG/ML IJ SOLN
INTRAMUSCULAR | Status: DC | PRN
  Administered 2022-11-23: 30 mg

## 2022-11-23 MED ORDER — FENTANYL CITRATE (PF) 250 MCG/5ML IJ SOLN
INTRAMUSCULAR | Status: DC | PRN
  Administered 2022-11-23: 50 ug via INTRAVENOUS

## 2022-11-23 MED ORDER — DEXAMETHASONE SODIUM PHOSPHATE 10 MG/ML IJ SOLN
10.0000 mg | Freq: Once | INTRAMUSCULAR | Status: AC
  Administered 2022-11-24: 10 mg via INTRAVENOUS
  Filled 2022-11-23: qty 1

## 2022-11-23 MED ORDER — CAPTOPRIL 25 MG PO TABS
25.0000 mg | ORAL_TABLET | Freq: Every day | ORAL | Status: DC
  Administered 2022-11-24: 25 mg via ORAL
  Filled 2022-11-23: qty 1

## 2022-11-23 MED ORDER — LEVOTHYROXINE SODIUM 50 MCG PO TABS
50.0000 ug | ORAL_TABLET | Freq: Every day | ORAL | Status: DC
  Administered 2022-11-24: 50 ug via ORAL
  Filled 2022-11-23: qty 1

## 2022-11-23 MED ORDER — 0.9 % SODIUM CHLORIDE (POUR BTL) OPTIME
TOPICAL | Status: DC | PRN
  Administered 2022-11-23: 1000 mL

## 2022-11-23 MED ORDER — ACETAMINOPHEN 500 MG PO TABS
1000.0000 mg | ORAL_TABLET | Freq: Four times a day (QID) | ORAL | Status: DC
  Administered 2022-11-23 – 2022-11-24 (×3): 1000 mg via ORAL
  Filled 2022-11-23 (×3): qty 2

## 2022-11-23 MED ORDER — TRAMADOL HCL 50 MG PO TABS: 50.0000 mg | ORAL_TABLET | Freq: Four times a day (QID) | ORAL | Status: DC | PRN

## 2022-11-23 MED ORDER — LACTATED RINGERS IV SOLN: INTRAVENOUS | Status: DC

## 2022-11-23 MED ORDER — BUPIVACAINE-EPINEPHRINE (PF) 0.25% -1:200000 IJ SOLN
INTRAMUSCULAR | Status: DC | PRN
  Administered 2022-11-23: 30 mL

## 2022-11-23 MED ORDER — METHOCARBAMOL 1000 MG/10ML IJ SOLN: 500.0000 mg | Freq: Four times a day (QID) | INTRAVENOUS | Status: DC | PRN

## 2022-11-23 MED ORDER — EPHEDRINE 5 MG/ML INJ
INTRAVENOUS | Status: AC
  Filled 2022-11-23: qty 5

## 2022-11-23 MED ORDER — CEFAZOLIN SODIUM-DEXTROSE 2-4 GM/100ML-% IV SOLN
2.0000 g | Freq: Four times a day (QID) | INTRAVENOUS | Status: AC
  Administered 2022-11-23 – 2022-11-24 (×2): 2 g via INTRAVENOUS
  Filled 2022-11-23 (×2): qty 100

## 2022-11-23 MED ORDER — FENTANYL CITRATE PF 50 MCG/ML IJ SOSY: 25.0000 ug | PREFILLED_SYRINGE | INTRAMUSCULAR | Status: DC | PRN

## 2022-11-23 MED ORDER — POVIDONE-IODINE 10 % EX SWAB: 2.0000 | Freq: Once | CUTANEOUS | Status: DC

## 2022-11-23 MED ORDER — ONDANSETRON HCL 4 MG/2ML IJ SOLN
INTRAMUSCULAR | Status: AC
  Filled 2022-11-23: qty 2

## 2022-11-23 MED ORDER — DEXAMETHASONE SODIUM PHOSPHATE 10 MG/ML IJ SOLN
INTRAMUSCULAR | Status: AC
  Filled 2022-11-23: qty 1

## 2022-11-23 MED ORDER — DOCUSATE SODIUM 100 MG PO CAPS
100.0000 mg | ORAL_CAPSULE | Freq: Two times a day (BID) | ORAL | Status: DC
  Administered 2022-11-23 – 2022-11-24 (×2): 100 mg via ORAL
  Filled 2022-11-23 (×2): qty 1

## 2022-11-23 MED ORDER — PROPOFOL 1000 MG/100ML IV EMUL
INTRAVENOUS | Status: AC
  Filled 2022-11-23: qty 100

## 2022-11-23 MED ORDER — OXYCODONE HCL 5 MG PO TABS: 5.0000 mg | ORAL_TABLET | Freq: Once | ORAL | Status: DC | PRN

## 2022-11-23 MED ORDER — OXYCODONE HCL 5 MG/5ML PO SOLN: 5.0000 mg | Freq: Once | ORAL | Status: DC | PRN

## 2022-11-23 MED ORDER — SODIUM CHLORIDE (PF) 0.9 % IJ SOLN
INTRAMUSCULAR | Status: DC | PRN
  Administered 2022-11-23: 30 mL

## 2022-11-23 MED ORDER — ACETAMINOPHEN 500 MG PO TABS
1000.0000 mg | ORAL_TABLET | Freq: Once | ORAL | Status: AC
  Administered 2022-11-23: 500 mg via ORAL
  Filled 2022-11-23: qty 2

## 2022-11-23 MED ORDER — EPINEPHRINE PF 1 MG/ML IJ SOLN
INTRAMUSCULAR | Status: AC
  Filled 2022-11-23: qty 1

## 2022-11-23 MED ORDER — FENTANYL CITRATE (PF) 100 MCG/2ML IJ SOLN
INTRAMUSCULAR | Status: AC
  Filled 2022-11-23: qty 2

## 2022-11-23 MED ORDER — EPHEDRINE SULFATE-NACL 50-0.9 MG/10ML-% IV SOSY
PREFILLED_SYRINGE | INTRAVENOUS | Status: DC | PRN
  Administered 2022-11-23: 5 mg via INTRAVENOUS
  Administered 2022-11-23 (×2): 10 mg via INTRAVENOUS

## 2022-11-23 MED ORDER — POLYETHYLENE GLYCOL 3350 17 G PO PACK
17.0000 g | PACK | Freq: Two times a day (BID) | ORAL | Status: DC
  Administered 2022-11-23 – 2022-11-24 (×2): 17 g via ORAL
  Filled 2022-11-23 (×2): qty 1

## 2022-11-23 MED ORDER — TRANEXAMIC ACID-NACL 1000-0.7 MG/100ML-% IV SOLN
1000.0000 mg | Freq: Once | INTRAVENOUS | Status: AC
  Administered 2022-11-23: 1000 mg via INTRAVENOUS
  Filled 2022-11-23: qty 100

## 2022-11-23 MED ORDER — RIVAROXABAN 10 MG PO TABS: 20.0000 mg | ORAL_TABLET | Freq: Every day | ORAL | Status: DC

## 2022-11-23 MED ORDER — BISACODYL 10 MG RE SUPP: 10.0000 mg | Freq: Every day | RECTAL | Status: DC | PRN

## 2022-11-23 MED ORDER — ONDANSETRON HCL 4 MG/2ML IJ SOLN
INTRAMUSCULAR | Status: DC | PRN
  Administered 2022-11-23: 4 mg via INTRAVENOUS

## 2022-11-23 MED ORDER — ATENOLOL 50 MG PO TABS
50.0000 mg | ORAL_TABLET | Freq: Every day | ORAL | Status: DC
  Administered 2022-11-24: 50 mg via ORAL
  Filled 2022-11-23: qty 1

## 2022-11-23 MED ORDER — AMLODIPINE BESYLATE 5 MG PO TABS
5.0000 mg | ORAL_TABLET | Freq: Every day | ORAL | Status: DC
  Administered 2022-11-24: 5 mg via ORAL
  Filled 2022-11-23: qty 1

## 2022-11-23 MED ORDER — BUPIVACAINE HCL (PF) 0.25 % IJ SOLN
INTRAMUSCULAR | Status: AC
  Filled 2022-11-23: qty 30

## 2022-11-23 MED ORDER — KETOROLAC TROMETHAMINE 30 MG/ML IJ SOLN
INTRAMUSCULAR | Status: AC
  Filled 2022-11-23: qty 1

## 2022-11-23 MED ORDER — MORPHINE SULFATE (PF) 2 MG/ML IV SOLN: 0.5000 mg | INTRAVENOUS | Status: DC | PRN

## 2022-11-23 MED ORDER — LIDOCAINE 2% (20 MG/ML) 5 ML SYRINGE
INTRAMUSCULAR | Status: DC | PRN
  Administered 2022-11-23: 20 mg via INTRAVENOUS

## 2022-11-23 MED ORDER — LIDOCAINE HCL (PF) 2 % IJ SOLN
INTRAMUSCULAR | Status: AC
  Filled 2022-11-23: qty 5

## 2022-11-23 MED ORDER — PROPOFOL 500 MG/50ML IV EMUL
INTRAVENOUS | Status: DC | PRN
  Administered 2022-11-23: 100 ug/kg/min via INTRAVENOUS

## 2022-11-23 MED ORDER — AMISULPRIDE (ANTIEMETIC) 5 MG/2ML IV SOLN: 10.0000 mg | Freq: Once | INTRAVENOUS | Status: DC | PRN

## 2022-11-23 MED ORDER — SODIUM CHLORIDE 0.9 % IV SOLN: INTRAVENOUS | Status: DC

## 2022-11-23 SURGICAL SUPPLY — 41 items
BAG COUNTER SPONGE SURGICOUNT (BAG) IMPLANT
BAG DECANTER FOR FLEXI CONT (MISCELLANEOUS) IMPLANT
BAG ZIPLOCK 12X15 (MISCELLANEOUS) IMPLANT
BLADE SAG 18X100X1.27 (BLADE) ×1 IMPLANT
COVER PERINEAL POST (MISCELLANEOUS) ×1 IMPLANT
COVER SURGICAL LIGHT HANDLE (MISCELLANEOUS) ×1 IMPLANT
CUP ACETBLR 52 OD PINNACLE (Hips) IMPLANT
DERMABOND ADVANCED .7 DNX12 (GAUZE/BANDAGES/DRESSINGS) ×1 IMPLANT
DRAPE FOOT SWITCH (DRAPES) ×1 IMPLANT
DRAPE STERI IOBAN 125X83 (DRAPES) ×1 IMPLANT
DRAPE U-SHAPE 47X51 STRL (DRAPES) ×2 IMPLANT
DRESSING AQUACEL AG SP 3.5X10 (GAUZE/BANDAGES/DRESSINGS) ×1 IMPLANT
DRSG AQUACEL AG SP 3.5X10 (GAUZE/BANDAGES/DRESSINGS) ×1
DURAPREP 26ML APPLICATOR (WOUND CARE) ×1 IMPLANT
ELECT REM PT RETURN 15FT ADLT (MISCELLANEOUS) ×1 IMPLANT
GLOVE BIO SURGEON STRL SZ 6 (GLOVE) ×1 IMPLANT
GLOVE BIOGEL PI IND STRL 6.5 (GLOVE) ×1 IMPLANT
GLOVE BIOGEL PI IND STRL 7.5 (GLOVE) ×1 IMPLANT
GLOVE ORTHO TXT STRL SZ7.5 (GLOVE) ×2 IMPLANT
GOWN STRL REUS W/ TWL LRG LVL3 (GOWN DISPOSABLE) ×2 IMPLANT
GOWN STRL REUS W/TWL LRG LVL3 (GOWN DISPOSABLE) ×2
HEAD M SROM 36MM PLUS 1.5 (Hips) IMPLANT
HOLDER FOLEY CATH W/STRAP (MISCELLANEOUS) ×1 IMPLANT
KIT TURNOVER KIT A (KITS) IMPLANT
LINER NEUTRAL 52X36MM PLUS 4 (Liner) IMPLANT
NDL SAFETY ECLIP 18X1.5 (MISCELLANEOUS) IMPLANT
PACK ANTERIOR HIP CUSTOM (KITS) ×1 IMPLANT
SCREW 6.5MMX40MM (Screw) IMPLANT
SROM M HEAD 36MM PLUS 1.5 (Hips) ×1 IMPLANT
STEM FEMORAL SZ5 HIGH ACTIS (Stem) IMPLANT
SUT MNCRL AB 4-0 PS2 18 (SUTURE) ×1 IMPLANT
SUT STRATAFIX 0 PDS 27 VIOLET (SUTURE) ×1
SUT VIC AB 1 CT1 36 (SUTURE) ×3 IMPLANT
SUT VIC AB 2-0 CT1 27 (SUTURE) ×2
SUT VIC AB 2-0 CT1 TAPERPNT 27 (SUTURE) ×2 IMPLANT
SUTURE STRATFX 0 PDS 27 VIOLET (SUTURE) ×1 IMPLANT
SYR 3ML LL SCALE MARK (SYRINGE) IMPLANT
TRAY FOLEY MTR SLVR 14FR STAT (SET/KITS/TRAYS/PACK) IMPLANT
TRAY FOLEY MTR SLVR 16FR STAT (SET/KITS/TRAYS/PACK) IMPLANT
TUBE SUCTION HIGH CAP CLEAR NV (SUCTIONS) ×1 IMPLANT
WATER STERILE IRR 1000ML POUR (IV SOLUTION) ×1 IMPLANT

## 2022-11-23 NOTE — Op Note (Signed)
NAME:  April Duke                ACCOUNT NO.: [id number]      MEDICAL RECORD NO.: [id number]      FACILITY:  [facility]      PHYSICIAN:  Mauri Pole  DATE OF BIRTH:  22-Jan-1946     DATE OF PROCEDURE:  11/23/2022                                 OPERATIVE REPORT         PREOPERATIVE DIAGNOSIS: Right  hip osteoarthritis.      POSTOPERATIVE DIAGNOSIS:  Right hip osteoarthritis.      PROCEDURE:  Right total hip replacement through an anterior approach   utilizing DePuy THR system, component size 52 mm pinnacle cup, a size 36+4 neutral   Altrex liner, a size 5 Hi Actis stem with a 36+1.5 Articuleze metal head ball.      SURGEON:  Pietro Cassis. Alvan Dame, M.D.      ASSISTANT:  Costella Hatcher, PA-C     ANESTHESIA:  Spinal.      SPECIMENS:  None.      COMPLICATIONS:  None.      BLOOD LOSS:  200 cc     DRAINS:  None.      INDICATION OF THE PROCEDURE:  April Duke is a 77 y.o. female who had   presented to office for evaluation of right hip pain.  Radiographs revealed   progressive degenerative changes with bone-on-bone   articulation of the  hip joint, including subchondral cystic changes and osteophytes.  The patient had painful limited range of   motion significantly affecting their overall quality of life and function.  The patient was failing to    respond to conservative measures including medications and/or injections and activity modification and at this point was ready   to proceed with more definitive measures.  Consent was obtained for   benefit of pain relief.  Specific risks of infection, DVT, component   failure, dislocation, neurovascular injury, and need for revision surgery were reviewed in the office.     PROCEDURE IN DETAIL:  The patient was brought to operative theater.   Once adequate anesthesia, preoperative antibiotics, 2 gm of Ancef, 1 gm of Tranexamic Acid, and 10 mg of Decadron were administered, the patient was positioned supine on the [employer] table.  Once the patient was safely positioned with adequate padding of boney prominences we predraped out the hip, and used fluoroscopy to confirm orientation of the pelvis.      The right hip was then prepped and draped from proximal iliac crest to   mid thigh with a shower curtain technique.      Time-out was performed identifying the patient, planned procedure, and the appropriate extremity.     An incision was then made 2 cm lateral to the   anterior superior iliac spine extending over the orientation of the   tensor fascia lata muscle and sharp dissection was carried down to the   fascia of the muscle.      The fascia was then incised.  The muscle belly was identified and swept   laterally and retractor placed along the superior neck.  Following   cauterization of the circumflex vessels and removing some pericapsular   fat, a second cobra retractor was placed on the inferior neck.  A  T-capsulotomy was made along the line of the   superior neck to the trochanteric fossa, then extended proximally and   distally.  Tag sutures were placed and the retractors were then placed   intracapsular.  We then identified the trochanteric fossa and   orientation of my neck cut and then made a neck osteotomy with the femur on traction.  The femoral   head was removed without difficulty or complication.  Traction was let   off and retractors were placed posterior and anterior around the   acetabulum.      The labrum and foveal tissue were debrided.  I began reaming with a 45 mm   reamer and reamed up to 51 mm reamer with good bony bed preparation and a 52 mm  cup was chosen.  The final 52 mm Pinnacle cup was then impacted under fluoroscopy to confirm the depth of penetration and orientation with respect to   Abduction and forward flexion.  A screw was placed into the ilium followed by the hole eliminator.  The final   36+4 neutral Altrex liner was impacted with good visualized rim fit.  The cup  was positioned anatomically within the acetabular portion of the pelvis.      At this point, the femur was rolled to 100 degrees.  Further capsule was   released off the inferior aspect of the femoral neck.  I then   released the superior capsule proximally.  With the leg in a neutral position the hook was placed laterally   along the femur under the vastus lateralis origin and elevated manually and then held in position using the hook attachment on the bed.  The leg was then extended and adducted with the leg rolled to 100   degrees of external rotation.  Retractors were placed along the medial calcar and posteriorly over the greater trochanter.  Once the proximal femur was fully   exposed, I used a box osteotome to set orientation.  I then began   broaching with the starting chili pepper broach and passed this by hand and then broached up to 5.  With the 5 broach in place I chose a high offset neck and did several trial reductions.  The offset was appropriate, leg lengths   appeared to be equal best matched with the +1.5 head ball trial confirmed radiographically.   Given these findings, I went ahead and dislocated the hip, repositioned all   retractors and positioned the right hip in the extended and abducted position.  The final 5 Hi Actis stem was   chosen and it was impacted down to the level of neck cut.  Based on this   and the trial reductions, a final 36+1.5 Articuleze metal head ball was chosen and   impacted onto a clean and dry trunnion, and the hip was reduced.  The   hip had been irrigated throughout the case again at this point.  I did   reapproximate the superior capsular leaflet to the anterior leaflet   using #1 Vicryl.  The fascia of the   tensor fascia lata muscle was then reapproximated using #1 Vicryl and #0 Stratafix sutures.  The   remaining wound was closed with 2-0 Vicryl and running 4-0 Monocryl.   The hip was cleaned, dried, and dressed sterilely using Dermabond and    Aquacel dressing.  The patient was then brought   to recovery room in stable condition tolerating the procedure well.    Costella Hatcher, PA-C  was present for the entirety of the case involved from   preoperative positioning, perioperative retractor management, general   facilitation of the case, as well as primary wound closure as assistant.            Pietro Cassis Alvan Dame, M.D.        11/23/2022 11:30 AM

## 2022-11-23 NOTE — H&P (Signed)
TOTAL HIP ADMISSION H&P  Patient is admitted for right total hip arthroplasty.  Subjective:  Chief Complaint: right hip pain  HPI: April Duke, 77 y.o. female, has a history of pain and functional disability in the right hip(s) due to arthritis and patient has failed non-surgical conservative treatments for greater than 12 weeks to include NSAID's and/or analgesics and activity modification.  Onset of symptoms was gradual starting 3 years ago with gradually worsening course since that time.The patient noted no past surgery on the right hip(s).  Patient currently rates pain in the right hip at 8 out of 10 with activity. Patient has worsening of pain with activity and weight bearing, pain that interfers with activities of daily living, and pain with passive range of motion. Patient has evidence of joint space narrowing by imaging studies. This condition presents safety issues increasing the risk of falls.   There is no current active infection.  Patient Active Problem List   Diagnosis Date Noted   Prediabetes 09/04/2021   Arthritis of right hip 01/30/2021   A-fib ([place]) 04/27/2019   HTN (hypertension) 04/27/2019   Hyperlipemia 04/27/2019   Hypothyroid 04/27/2019   Osteoporosis 04/27/2019   GERD (gastroesophageal reflux disease) 07/02/2014   Past Medical History:  Diagnosis Date   Atrial fibrillation ([place])    Hyperlipidemia    Hypertension    Hypothyroidism    Osteoporosis    Pneumonia    Thyroid disease     Past Surgical History:  Procedure Laterality Date   CARDIOVERSION      No current facility-administered medications for this encounter.   Current Outpatient Medications  Medication Sig Dispense Refill Last Dose   acetaminophen (TYLENOL) 500 MG tablet Take 500 mg by mouth daily.      amLODipine (NORVASC) 5 MG tablet Take 1 tablet (5 mg total) by mouth daily. 90 tablet 3    atenolol (TENORMIN) 25 MG tablet Take 1 tablet (25 mg total) by mouth daily. (Patient taking differently:  Take 50 mg by mouth daily.) 90 tablet 3    calcium carbonate (OSCAL) 1500 (600 Ca) MG TABS tablet Take by mouth 2 (two) times daily with a meal.      captopril (CAPOTEN) 50 MG tablet Take 1 tablet (50 mg total) by mouth 2 (two) times daily. (Patient taking differently: Take 25 mg by mouth daily.) 180 tablet 3    Multiple Vitamin (MULTIVITAMIN) tablet Take 1 tablet by mouth daily.      rosuvastatin (CRESTOR) 5 MG tablet Take 1 tablet (5 mg total) by mouth daily. 90 tablet 3    SYNTHROID 50 MCG tablet Take 1 tablet (50 mcg total) by mouth daily. 90 tablet 3    XARELTO 20 MG TABS tablet Take 1 tablet (20 mg total) by mouth daily with supper. 90 tablet 2    diclofenac Sodium (VOLTAREN) 1 % GEL Apply 2 g topically 4 (four) times daily. (Patient not taking: Reported on 11/09/2022) 350 g 3 Not Taking   Allergies  Allergen Reactions   Alcohol Palpitations    Other reaction(s): RAPID HEARTRATE Other reaction(s): RAPID HEARTRATE Other reaction(s): RAPID HEARTRATE    Sulfa Antibiotics Rash   Ethanol     Other reaction(s): RAPID HEARTRATE     Social History   Tobacco Use   Smoking status: Never   Smokeless tobacco: Never  Substance Use Topics   Alcohol use: Never    Family History  Problem Relation Age of Onset   Cancer Mother  breast   Hypertension Mother    Hypertension Father    Pulmonary embolism Sister    Lung cancer Brother    Hypertension Brother    Hyperlipidemia Brother      Review of Systems  Constitutional:  Negative for chills and fever.  Respiratory:  Negative for cough and shortness of breath.   Cardiovascular:  Negative for chest pain.  Gastrointestinal:  Negative for nausea and vomiting.  Musculoskeletal:  Positive for arthralgias.     Objective:  Physical Exam Well nourished and well developed. General: Alert and oriented x3, cooperative and pleasant, no acute distress. Head: normocephalic, atraumatic, neck supple. Eyes:  EOMI.  Musculoskeletal: Bilateral hip exams: Based on her radiographic appearance as well as her clinical presentation pain I deferred examining her hips due to the obvious pain with her ambulation. No significant lower extremity edema, erythema  Calves soft and nontender. Motor function intact in LE. Strength 5/5 LE bilaterally. Neuro: Distal pulses 2+. Sensation to light touch intact in LE.  Vital signs in last 24 hours:    Labs:   Estimated body mass index is 22.31 kg/m as calculated from the following:   Height as of 11/11/22: 5\' 4"  (1.626 m).   Weight as of 11/11/22: 59 kg.   Imaging Review Plain radiographs demonstrate severe degenerative joint disease of the right hip(s). The bone quality appears to be adequate for age and reported activity level.      Assessment/Plan:  End stage arthritis, right hip(s)  The patient history, physical examination, clinical judgement of the provider and imaging studies are consistent with end stage degenerative joint disease of the right hip(s) and total hip arthroplasty is deemed medically necessary. The treatment options including medical management, injection therapy, arthroscopy and arthroplasty were discussed at length. The risks and benefits of total hip arthroplasty were presented and reviewed. The risks due to aseptic loosening, infection, stiffness, dislocation/subluxation,  thromboembolic complications and other imponderables were discussed.  The patient acknowledged the explanation, agreed to proceed with the plan and consent was signed. Patient is being admitted for inpatient treatment for surgery, pain control, PT, OT, prophylactic antibiotics, VTE prophylaxis, progressive ambulation and ADL's and discharge planning.The patient is planning to be discharged  home.  Therapy Plans: HEP Disposition: Home with husband & son Planned DVT Prophylaxis: Xarelto DME needed: walker PCP: Dr. Warrick Parisian, clearance received Cardio: Dr.  Percival Spanish - (will take form by today) TXA: IV Allergies: caffeine/alcohol - a fib , sulfa - rash Anesthesia Concerns: none BMI: 23 Last HgbA1c: Not diabetic   Other: - A fib - prefers minimal meds (doesn't like to take tylenol even) - tylenol, methocarbamol, tramadol   Costella Hatcher, PA-C Orthopedic Surgery [facility] [place] [phone]

## 2022-11-23 NOTE — Anesthesia Postprocedure Evaluation (Signed)
Anesthesia Post Note  Patient: April Duke  Procedure(s) Performed: TOTAL HIP ARTHROPLASTY ANTERIOR APPROACH (Right: Hip)     Patient location during evaluation: PACU Anesthesia Type: MAC and Spinal Level of consciousness: awake Pain management: pain level controlled Vital Signs Assessment: post-procedure vital signs reviewed and stable Respiratory status: spontaneous breathing, respiratory function stable and nonlabored ventilation Cardiovascular status: blood pressure returned to baseline and stable Postop Assessment: no headache, no backache and no apparent nausea or vomiting Anesthetic complications: no   No notable events documented.  Last Vitals:  Vitals:   11/23/22 1430 11/23/22 1500  BP: (!) 128/55 (!) 147/67  Pulse: (!) 50 (!) 57  Resp: 15 18  Temp:    SpO2: 92% 98%    Last Pain:  Vitals:   11/23/22 1430  TempSrc:   PainSc: 0-No pain                 Nilda Simmer

## 2022-11-23 NOTE — Evaluation (Signed)
Physical Therapy Evaluation Patient Details Name: April Duke MRN: [id number] DOB: Feb 12, 1946 Today's Date: 11/23/2022  History of Present Illness  Pt is a 77yo female presenting s/p R-THA, AA on 11/23/22. PMH: afib, HLD, HTN, hypothyroidism, osteoporosis, PNA, GERD.   Clinical Impression  April Duke is a 77 y.o. female POD 0 s/p R-THA, AA. Patient reports modified independence using SPC at home and RW during with mobility at baseline. Patient is now limited by functional impairments (see PT problem list below) and requires supervision for bed mobility and min guard for transfers. Patient was able to ambulate 30 feet with RW and min guard level of assist. Patient instructed in exercise to facilitate ROM and circulation to manage edema. Provided incentive spirometer and with Vcs pt able to achieve 1074mL. Patient will benefit from continued skilled PT interventions to address impairments and progress towards PLOF. Acute PT will follow to progress mobility and stair training in preparation for safe discharge home.       Recommendations for follow up therapy are one component of a multi-disciplinary discharge planning process, led by the attending physician.  Recommendations may be updated based on patient status, additional functional criteria and insurance authorization.  Follow Up Recommendations Follow physician's recommendations for discharge plan and follow up therapies      Assistance Recommended at Discharge Frequent or constant Supervision/Assistance  Patient can return home with the following  A little help with walking and/or transfers;A little help with bathing/dressing/bathroom;Assistance with cooking/housework;Assist for transportation;Help with stairs or ramp for entrance    Equipment Recommendations None recommended by PT  Recommendations for Other Services       Functional Status Assessment Patient has had a recent decline in their functional status and demonstrates the ability  to make significant improvements in function in a reasonable and predictable amount of time.     Precautions / Restrictions Precautions Precautions: Fall Restrictions Weight Bearing Restrictions: No Other Position/Activity Restrictions: wbat      Mobility  Bed Mobility Overal bed mobility: Needs Assistance Bed Mobility: Supine to Sit     Supine to sit: Min guard, HOB elevated     General bed mobility comments: For safety only.    Transfers Overall transfer level: Needs assistance Equipment used: Rolling walker (2 wheels) Transfers: Sit to/from Stand Sit to Stand: Min guard           General transfer comment: For safety only.    Ambulation/Gait Ambulation/Gait assistance: Min guard Gait Distance (Feet): 30 Feet Assistive device: Rolling walker (2 wheels) Gait Pattern/deviations: Step-to pattern Gait velocity: decreased     General Gait Details: Pt ambulated 53ft with RW and min guard, no physical assist required or overt LOB noted.  Stairs            Wheelchair Mobility    Modified Rankin (Stroke Patients Only)       Balance Overall balance assessment: Needs assistance Sitting-balance support: Feet supported, No upper extremity supported Sitting balance-Leahy Scale: Good     Standing balance support: Reliant on assistive device for balance, During functional activity, Bilateral upper extremity supported Standing balance-Leahy Scale: Poor                               Pertinent Vitals/Pain Pain Assessment Pain Assessment: No/denies pain    Home Living Family/patient expects to be discharged to:: Private residence Living Arrangements: Spouse/significant other Available Help at Discharge: Family;Available 24 hours/day Type of Home: [place]  Access: Stairs to enter Entrance Stairs-Rails: Right;Left;Can reach both Entrance Stairs-Number of Steps: 3   Home Layout: One level Home Equipment: Conservation officer, nature (2 wheels);Tub  bench;BSC/3in1;Cane - single point      Prior Function Prior Level of Function : Independent/Modified Independent             Mobility Comments: SPC at home, RW for community mobility ADLs Comments: Husband helps with dressing especially socks, shoes, and pants     Hand Dominance        Extremity/Trunk Assessment   Upper Extremity Assessment Upper Extremity Assessment: Overall WFL for tasks assessed    Lower Extremity Assessment Lower Extremity Assessment: RLE deficits/detail;LLE deficits/detail RLE Deficits / Details: MMT ank DF/PF 3+/5, Pt not able to fire glutes well until probably the 3rd try. RLE Sensation: WNL LLE Deficits / Details: MMT ank DF/PF 3+/5, Pt not able to fire glutes well until probably the 3rd try. LLE Sensation: WNL    Cervical / Trunk Assessment Cervical / Trunk Assessment: Kyphotic  Communication   Communication: No difficulties  Cognition Arousal/Alertness: Awake/alert Behavior During Therapy: WFL for tasks assessed/performed Overall Cognitive Status: Within Functional Limits for tasks assessed                                          General Comments      Exercises Total Joint Exercises Ankle Circles/Pumps: AROM, Both, 20 reps   Assessment/Plan    PT Assessment Patient needs continued PT services  PT Problem List Decreased strength;Decreased range of motion;Decreased activity tolerance;Decreased balance;Decreased mobility;Pain       PT Treatment Interventions DME instruction;Gait training;Stair training;Functional mobility training;Therapeutic activities;Therapeutic exercise;Balance training;Neuromuscular re-education;Patient/family education    PT Goals (Current goals can be found in the Care Plan section)  Acute Rehab PT Goals Patient Stated Goal: To walk further without pain PT Goal Formulation: With patient Time For Goal Achievement: 11/30/22 Potential to Achieve Goals: Good    Frequency 7X/week      Co-evaluation               AM-PAC PT "6 Clicks" Mobility  Outcome Measure Help needed turning from your back to your side while in a flat bed without using bedrails?: None Help needed moving from lying on your back to sitting on the side of a flat bed without using bedrails?: A Little Help needed moving to and from a bed to a chair (including a wheelchair)?: A Little Help needed standing up from a chair using your arms (e.g., wheelchair or bedside chair)?: A Little Help needed to walk in hospital room?: A Little Help needed climbing 3-5 steps with a railing? : A Little 6 Click Score: 19    End of Session Equipment Utilized During Treatment: Gait belt Activity Tolerance: Patient tolerated treatment well;No increased pain Patient left: in chair;with call bell/phone within reach;with chair alarm set;with family/visitor present Nurse Communication: Mobility status PT Visit Diagnosis: Pain;Difficulty in walking, not elsewhere classified (R26.2) Pain - Right/Left: Right Pain - part of body: Hip    Time: 1478-2956 PT Time Calculation (min) (ACUTE ONLY): 26 min   Charges:   PT Evaluation $PT Eval Low Complexity: 1 Low PT Treatments $Gait Training: 8-22 mins        Coolidge Breeze, PT, DPT [facility] Office: [phone] Weekend pager: [phone]  Coolidge Breeze 11/23/2022, 5:24 PM

## 2022-11-23 NOTE — Anesthesia Procedure Notes (Signed)
Procedure Name: MAC Date/Time: 11/23/2022 11:48 AM  Performed by: Jenne Campus, CRNAPre-anesthesia Checklist: Patient identified, Emergency Drugs available, Suction available and Patient being monitored Oxygen Delivery Method: Simple face mask

## 2022-11-23 NOTE — Plan of Care (Signed)
  Problem: Education: Goal: Knowledge of the prescribed therapeutic regimen will improve Outcome: Progressing Goal: Understanding of discharge needs will improve Outcome: Progressing   Problem: Activity: Goal: Ability to avoid complications of mobility impairment will improve Outcome: Progressing   Problem: Education: Goal: Knowledge of General Education information will improve Description: Including pain rating scale, medication(s)/side effects and non-pharmacologic comfort measures Outcome: Progressing   Problem: Activity: Goal: Risk for activity intolerance will decrease Outcome: Progressing   Problem: Pain Managment: Goal: General experience of comfort will improve Outcome: Progressing

## 2022-11-23 NOTE — Transfer of Care (Signed)
Immediate Anesthesia Transfer of Care Note  Patient: April Duke  Procedure(s) Performed: TOTAL HIP ARTHROPLASTY ANTERIOR APPROACH (Right: Hip)  Patient Location: PACU  Anesthesia Type:MAC and Spinal  Level of Consciousness: oriented, sedated, and patient cooperative  Airway & Oxygen Therapy: Patient Spontanous Breathing and Patient connected to face mask oxygen  Post-op Assessment: Report given to RN and Post -op Vital signs reviewed and stable  Post vital signs: Reviewed  Last Vitals:  Vitals Value Taken Time  BP 97/50 11/23/22 1303  Temp    Pulse 36 11/23/22 1303  Resp 15 11/23/22 1305  SpO2 86 % 11/23/22 1303  Vitals shown include unvalidated device data.  Last Pain:  Vitals:   11/23/22 0928  TempSrc:   PainSc: 7       Patients Stated Pain Goal: 6 (18/29/93 7169)  Complications: No notable events documented.

## 2022-11-23 NOTE — Discharge Instructions (Signed)

## 2022-11-23 NOTE — Anesthesia Procedure Notes (Signed)
Spinal  Patient location during procedure: OR Start time: 11/23/2022 11:38 AM End time: 11/23/2022 11:42 AM Reason for block: surgical anesthesia Staffing Performed: resident/CRNA  Resident/CRNA: Jenne Campus, CRNA Performed by: Jenne Campus, CRNA Authorized by: Nilda Simmer, MD   Preanesthetic Checklist Completed: patient identified, IV checked, site marked, risks and benefits discussed, surgical consent, monitors and equipment checked, pre-op evaluation and timeout performed Spinal Block Patient position: sitting Prep: DuraPrep Patient monitoring: heart rate, cardiac monitor, continuous pulse ox and blood pressure Approach: midline Location: L3-4 Injection technique: single-shot Needle Needle type: Pencan  Needle gauge: 24 G Needle length: 10 cm Assessment Sensory level: T6 Events: CSF return Additional Notes IV functioning, monitors applied to pt. Expiration date of kit checked and confirmed to be in date.  Sterile prep and drape, including hand hygiene, mask and sterile gloves were used. The patient was positioned and the spine was prepped. Skin was anesthetized with lidocaine. Free flow of clear CSF obtained prior to injecting local anesthetic into the CSF. Spinal needle aspirated freely following injection. Needle was carefully withdrawn, and pt tolerated procedure well. Loss of motor and sensory on exam post injection.

## 2022-11-23 NOTE — Interval H&P Note (Signed)
History and Physical Interval Note:  11/23/2022 9:50 AM  April Duke  has presented today for surgery, with the diagnosis of Right hip osteoarthritis.  The various methods of treatment have been discussed with the patient and family. After consideration of risks, benefits and other options for treatment, the patient has consented to  Procedure(s): TOTAL HIP ARTHROPLASTY ANTERIOR APPROACH (Right) as a surgical intervention.  The patient's history has been reviewed, patient examined, no change in status, stable for surgery.  I have reviewed the patient's chart and labs.  Questions were answered to the patient's satisfaction.     Mauri Pole

## 2022-11-24 DIAGNOSIS — M1611 Unilateral primary osteoarthritis, right hip: Secondary | ICD-10-CM | POA: Diagnosis not present

## 2022-11-24 LAB — CBC
HCT: 31.4 % — ABNORMAL LOW (ref 36.0–46.0)
Hemoglobin: 10.7 g/dL — ABNORMAL LOW (ref 12.0–15.0)
MCH: 30.3 pg (ref 26.0–34.0)
MCHC: 34.1 g/dL (ref 30.0–36.0)
MCV: 89 fL (ref 80.0–100.0)
Platelets: 188 10*3/uL (ref 150–400)
RBC: 3.53 MIL/uL — ABNORMAL LOW (ref 3.87–5.11)
RDW: 12.9 % (ref 11.5–15.5)
WBC: 10.8 10*3/uL — ABNORMAL HIGH (ref 4.0–10.5)
nRBC: 0 % (ref 0.0–0.2)

## 2022-11-24 LAB — BASIC METABOLIC PANEL
Anion gap: 10 (ref 5–15)
BUN: 12 mg/dL (ref 8–23)
CO2: 21 mmol/L — ABNORMAL LOW (ref 22–32)
Calcium: 8.9 mg/dL (ref 8.9–10.3)
Chloride: 98 mmol/L (ref 98–111)
Creatinine, Ser: 0.61 mg/dL (ref 0.44–1.00)
GFR, Estimated: >60 mL/min (ref >60)
Glucose, Bld: 224 mg/dL — ABNORMAL HIGH (ref 70–99)
Potassium: 3.7 mmol/L (ref 3.5–5.1)
Sodium: 129 mmol/L — ABNORMAL LOW (ref 135–145)

## 2022-11-24 MED ORDER — ACETAMINOPHEN 500 MG PO TABS: 1000.0000 mg | ORAL_TABLET | Freq: Four times a day (QID) | ORAL | 0 refills | Status: DC

## 2022-11-24 MED ORDER — METHOCARBAMOL 500 MG PO TABS: 500.0000 mg | ORAL_TABLET | Freq: Four times a day (QID) | ORAL | 1 refills | Status: DC | PRN

## 2022-11-24 MED ORDER — POLYETHYLENE GLYCOL 3350 17 G PO PACK: 17.0000 g | PACK | Freq: Two times a day (BID) | ORAL | 0 refills | Status: DC

## 2022-11-24 MED ORDER — TRAMADOL HCL 50 MG PO TABS: 50.0000 mg | ORAL_TABLET | Freq: Four times a day (QID) | ORAL | 0 refills | Status: DC | PRN

## 2022-11-24 NOTE — TOC Transition Note (Signed)
Transition of Care [facility]) - CM/SW Discharge Note   Patient Details  Name: April Duke MRN: [id number] Date of Birth: September 25, 1946  Transition of Care [facility]) CM/SW Contact:  Lennart Pall, LCSW Phone Number: 11/24/2022, 9:59 AM   Clinical Narrative:     Met with pt and sons and confirming she has received RW via [employer] to room.  Plan for HEP.  No TOC needs.  Final next level of care: Home/Self Care Barriers to Discharge: No Barriers Identified   Patient Goals and CMS Choice      Discharge Placement                         Discharge Plan and Services Additional resources added to the After Visit Summary for                  DME Arranged: Walker rolling DME Agency: [employer]       HH Arranged: NA [place] Agency: NA        Social Determinants of Health (SDOH) Interventions SDOH Screenings   Food Insecurity: No Food Insecurity (11/23/2022)  Housing: Low Risk  (11/23/2022)  Transportation Needs: No Transportation Needs (11/23/2022)  Utilities: Not At Risk (11/23/2022)  Alcohol Screen: Low Risk  (02/02/2022)  Depression (PHQ2-9): Low Risk  (09/08/2022)  Financial Resource Strain: Low Risk  (02/02/2022)  Physical Activity: Insufficiently Active (02/02/2022)  Social Connections: Socially Integrated (02/02/2022)  Stress: No Stress Concern Present (02/02/2022)  Tobacco Use: Low Risk  (11/23/2022)     Readmission Risk Interventions     No data to display

## 2022-11-24 NOTE — Care Plan (Signed)
Ortho Bundle Case Management Note  Patient Details  Name: April Duke MRN: [id number] Date of Birth: 11-Jun-1946  R THA on 11-23-22 DCP:  Home with husband and son DME:  RW ordered through [place] PT:  HEP                   DME Arranged:  [place] Rile rolling DME Agency:  [employer]  HH Arranged:  NA [place] Agency:  NA  Additional Comments: Please contact me with any questions of if this plan should need to change.  Marianne Sofia, RN,CCM [facility]  [phone] 11/24/2022, 9:37 AM

## 2022-11-24 NOTE — Plan of Care (Signed)
  Problem: Education: Goal: Knowledge of the prescribed therapeutic regimen will improve Outcome: Progressing   Problem: Pain Management: Goal: Pain level will decrease with appropriate interventions Outcome: Progressing   Problem: Activity: Goal: Risk for activity intolerance will decrease Outcome: Progressing   

## 2022-11-24 NOTE — Progress Notes (Signed)
Physical Therapy Treatment Patient Details Name: April Duke MRN: [id number] DOB: [birth year] Today's Date: 11/24/2022   History of Present Illness Pt is a 77yo female presenting s/p R-THA, AA on 11/23/22. PMH: afib, HLD, HTN, hypothyroidism, osteoporosis, PNA, GERD    PT Comments    Pt seen POD1 received after restroom trip with RN, sitting EOB without pain report. Required supervision for bed mobility, min guard for transfers, and min guard for ambulation in hallway with RW 152ft. Pt completed stair training with min guard, family present and educated on appropriate gaurding and stair mobility technique. Provided HEP and pt completed exercises with safe form and minimal cuing, requesting to do half reps now and half after resting. All education completed and pt has no further questions. Pt has met mobility goals for safe discharge home, PT is signing off, should needs change please reconsult. Thank you for this referral.   Recommendations for follow up therapy are one component of a multi-disciplinary discharge planning process, led by the attending physician.  Recommendations may be updated based on patient status, additional functional criteria and insurance authorization.  Follow Up Recommendations  Follow physician's recommendations for discharge plan and follow up therapies     Assistance Recommended at Discharge Frequent or constant Supervision/Assistance  Patient can return home with the following A little help with walking and/or transfers;A little help with bathing/dressing/bathroom;Assistance with cooking/housework;Assist for transportation;Help with stairs or ramp for entrance   Equipment Recommendations  None recommended by PT    Recommendations for Other Services       Precautions / Restrictions Precautions Precautions: Fall Restrictions Weight Bearing Restrictions: No Other Position/Activity Restrictions: wbat     Mobility  Bed Mobility Overal bed mobility: Needs  Assistance Bed Mobility: Sit to Supine       Sit to supine: Supervision   General bed mobility comments: For safety only.    Transfers Overall transfer level: Needs assistance Equipment used: Rolling walker (2 wheels) Transfers: Sit to/from Stand Sit to Stand: Min guard           General transfer comment: For safety only.    Ambulation/Gait Ambulation/Gait assistance: Min guard Gait Distance (Feet): 120 Feet Assistive device: Rolling walker (2 wheels) Gait Pattern/deviations: Step-to pattern Gait velocity: decreased     General Gait Details: Pt ambulated with RW no physical assist required or overt LOB noted.   Stairs Stairs: Yes Stairs assistance: Min guard Stair Management: Two rails, Step to pattern, Forwards Number of Stairs: 3 General stair comments: Pt completed stair training with min guard and VCs for seqeuencing.   Wheelchair Mobility    Modified Rankin (Stroke Patients Only)       Balance Overall balance assessment: Needs assistance Sitting-balance support: Feet supported, No upper extremity supported Sitting balance-Leahy Scale: Good     Standing balance support: Reliant on assistive device for balance, During functional activity, Bilateral upper extremity supported Standing balance-Leahy Scale: Poor                              Cognition Arousal/Alertness: Awake/alert Behavior During Therapy: WFL for tasks assessed/performed Overall Cognitive Status: Within Functional Limits for tasks assessed                                          Exercises Total Joint Exercises Ankle Circles/Pumps: AROM, Both, 20 reps Quad  Sets: AROM, Right, 5 reps, Supine Short Arc Quad: AROM, Right, 5 reps, Supine Heel Slides: AROM, Right, 5 reps, Supine Hip ABduction/ADduction: AROM, Right, 5 reps, Supine (Standing: educated not performed) Long Arc Quad: Other (comment) (educated not performed) Knee Flexion: Other (comment),  Standing (educated not performed) Marching in Standing: Other (comment) (educated not performed) Standing Hip Extension: Other (comment) (educated not performed)    General Comments General comments (skin integrity, edema, etc.): Sons present      Pertinent Vitals/Pain Pain Assessment Pain Assessment: No/denies pain    Home Living                          Prior Function            PT Goals (current goals can now be found in the care plan section) Acute Rehab PT Goals Patient Stated Goal: To walk further without pain PT Goal Formulation: With patient Time For Goal Achievement: 11/30/22 Potential to Achieve Goals: Good Progress towards PT goals: Goals met/education completed, patient discharged from PT    Frequency    7X/week      PT Plan Current plan remains appropriate    Co-evaluation              AM-PAC PT "6 Clicks" Mobility   Outcome Measure  Help needed turning from your back to your side while in a flat bed without using bedrails?: None Help needed moving from lying on your back to sitting on the side of a flat bed without using bedrails?: A Little Help needed moving to and from a bed to a chair (including a wheelchair)?: A Little Help needed standing up from a chair using your arms (e.g., wheelchair or bedside chair)?: A Little Help needed to walk in hospital room?: A Little Help needed climbing 3-5 steps with a railing? : A Little 6 Click Score: 19    End of Session Equipment Utilized During Treatment: Gait belt Activity Tolerance: Patient tolerated treatment well;No increased pain Patient left: with call bell/phone within reach;with family/visitor present;in bed;with nursing/sitter in room Nurse Communication: Mobility status PT Visit Diagnosis: Pain;Difficulty in walking, not elsewhere classified (R26.2) Pain - Right/Left: Right Pain - part of body: Hip     Time: 6979-4801 PT Time Calculation (min) (ACUTE ONLY): 35  min  Charges:  $Gait Training: 8-22 mins $Therapeutic Exercise: 8-22 mins                     Coolidge Breeze, PT, DPT [facility] Office: [phone] Weekend pager: [phone]   Coolidge Breeze 11/24/2022, 10:25 AM

## 2022-11-24 NOTE — Progress Notes (Signed)
Patient ID: April Duke, female   DOB: [birth year], 77 y.o.   MRN: [id number] Subjective: 1 Day Post-Op Procedure(s) (LRB): TOTAL HIP ARTHROPLASTY ANTERIOR APPROACH (Right)    Patient reports pain as mild.  No events overnight.  Objective:   VITALS:   Vitals:   11/24/22 0052 11/24/22 0448  BP: (!) 138/49 136/60  Pulse: (!) 54 (!) 55  Resp: 16 16  Temp: 98 F (36.7 C) 98.1 F (36.7 C)  SpO2: 97% 98%    Neurovascular intact Incision: dressing C/D/I  LABS Recent Labs    11/24/22 0351  HGB 10.7*  HCT 31.4*  WBC 10.8*  PLT 188    Recent Labs    11/24/22 0351  NA 129*  K 3.7  BUN 12  CREATININE 0.61  GLUCOSE 224*    No results for input(s): "LABPT", "INR" in the last 72 hours.   Assessment/Plan: 1 Day Post-Op Procedure(s) (LRB): TOTAL HIP ARTHROPLASTY ANTERIOR APPROACH (Right)   Up with therapy - WBAT Likely home today after therapy RTC in weeks Rx sent

## 2022-11-25 ENCOUNTER — Telehealth (INDEPENDENT_AMBULATORY_CARE_PROVIDER_SITE_OTHER): Payer: Medicare Other | Admitting: Family Medicine

## 2022-11-25 ENCOUNTER — Encounter (HOSPITAL_COMMUNITY): Payer: Self-pay | Admitting: Orthopedic Surgery

## 2022-11-25 DIAGNOSIS — R399 Unspecified symptoms and signs involving the genitourinary system: Secondary | ICD-10-CM | POA: Diagnosis not present

## 2022-11-25 MED ORDER — CEPHALEXIN 500 MG PO CAPS: 500.0000 mg | ORAL_CAPSULE | Freq: Two times a day (BID) | ORAL | 0 refills | Status: DC

## 2022-11-25 NOTE — Progress Notes (Signed)
Virtual Visit  Note Due to COVID-19 pandemic this visit was conducted virtually. This visit type was conducted due to national recommendations for restrictions regarding the COVID-19 Pandemic (e.g. social distancing, sheltering in place) in an effort to limit this patient's exposure and mitigate transmission in our community. All issues noted in this document were discussed and addressed.  A physical exam was not performed with this format.  I connected with April Duke on 11/25/22 at 1324 by telephone and verified that I am speaking with the correct person using two identifiers. April Duke is currently located at home and his son is currently with her during her visit. The provider, Gwenlyn Perking, FNP is located in their office at time of visit.  I discussed the limitations, risks, security and privacy concerns of performing an evaluation and management service by telephone and the availability of in person appointments. I also discussed with the patient that there may be a patient responsible charge related to this service. The patient expressed understanding and agreed to proceed.  CC: UTI  History and Present Illness:  Urinary Tract Infection  This is a new problem. The current episode started yesterday. The problem occurs every urination. The problem has been unchanged. The quality of the pain is described as burning. There has been no fever. Associated symptoms include frequency, hesitancy and urgency. Pertinent negatives include no chills, discharge, hematuria, possible pregnancy or vomiting. She has tried nothing for the symptoms.   She just had a right hip replacement and is unable to get to the office today.    Review of Systems  Constitutional:  Negative for chills.  Gastrointestinal:  Negative for vomiting.  Genitourinary:  Positive for frequency, hesitancy and urgency. Negative for hematuria.     Observations/Objective: Alert and oriented x 3. Able to speak in full  sentences without difficulty.    Assessment and Plan: April Duke was seen today for urinary tract infection.  Diagnoses and all orders for this visit:  UTI symptoms UA and culture order for if she is able to have a family member bring by a specimen for her. Discussed the benefit for this would be ensuring that she is on the appropriate abx if culture is positive. I have sent in keflex empirically if she is unable to get a specimen her. Discussed not to start abx until after leaving specimen if so. Return to office for new or worsening symptoms, or if symptoms persist.  -     Urinalysis, Routine w reflex microscopic; Future -     Urine Culture; Future -     cephALEXin (KEFLEX) 500 MG capsule; Take 1 capsule (500 mg total) by mouth 2 (two) times daily.     Follow Up Instructions: As needed.     I discussed the assessment and treatment plan with the patient. The patient was provided an opportunity to ask questions and all were answered. The patient agreed with the plan and demonstrated an understanding of the instructions.   The patient was advised to call back or seek an in-person evaluation if the symptoms worsen or if the condition fails to improve as anticipated.  The above assessment and management plan was discussed with the patient. The patient verbalized understanding of and has agreed to the management plan. Patient is aware to call the clinic if symptoms persist or worsen. Patient is aware when to return to the clinic for a follow-up visit. Patient educated on when it is appropriate to go to the emergency department.  Time call ended: 1336    I provided 12 minutes of  non face-to-face time during this encounter.    Gwenlyn Perking, FNP

## 2022-11-26 ENCOUNTER — Encounter: Payer: Self-pay | Admitting: Family Medicine

## 2022-12-01 NOTE — Discharge Summary (Signed)
Patient ID: April Duke MRN: [id number] DOB/AGE: [birth year] 77 y.o.  Admit date: 11/23/2022 Discharge date: 11/24/2022  Admission Diagnoses:  Right hip osteoarthritis  Discharge Diagnoses:  Principal Problem:   S/P total right hip arthroplasty   Past Medical History:  Diagnosis Date   Atrial fibrillation ([place])    Hyperlipidemia    Hypertension    Hypothyroidism    Osteoporosis    Pneumonia    Thyroid disease     Surgeries: Procedure(s): TOTAL HIP ARTHROPLASTY ANTERIOR APPROACH on 11/23/2022   Consultants:   Discharged Condition: Improved  Hospital Course: April Duke is an 77 y.o. female who was admitted 11/23/2022 for operative treatment ofS/P total right hip arthroplasty. Patient has severe unremitting pain that affects sleep, daily activities, and work/hobbies. After pre-op clearance the patient was taken to the operating room on 11/23/2022 and underwent  Procedure(s): TOTAL HIP ARTHROPLASTY ANTERIOR APPROACH.    Patient was given perioperative antibiotics:  Anti-infectives (From admission, onward)    Start     Dose/Rate Route Frequency Ordered Stop   11/23/22 1800  ceFAZolin (ANCEF) IVPB 2g/100 mL premix        2 g 200 mL/hr over 30 Minutes Intravenous Every 6 hours 11/23/22 1503 11/24/22 0049   11/23/22 0915  ceFAZolin (ANCEF) IVPB 2g/100 mL premix        2 g 200 mL/hr over 30 Minutes Intravenous On call to O.R. 11/23/22 0911 11/23/22 1146        Patient was given sequential compression devices, early ambulation, and chemoprophylaxis to prevent DVT. Patient worked with PT and was meeting their goals regarding safe ambulation and transfers.  Patient benefited maximally from hospital stay and there were no complications.    Recent vital signs: No data found.   Recent laboratory studies: No results for input(s): "WBC", "HGB", "HCT", "PLT", "NA", "K", "CL", "CO2", "BUN", "CREATININE", "GLUCOSE", "INR", "CALCIUM" in the last 72 hours.  Invalid input(s): "PT",  "2"   Discharge Medications:   Allergies as of 11/24/2022       Reactions   Alcohol Palpitations   Other reaction(s): RAPID HEARTRATE Other reaction(s): RAPID HEARTRATE Other reaction(s): RAPID HEARTRATE   Sulfa Antibiotics Rash   Ethanol    Other reaction(s): RAPID HEARTRATE         Medication List     TAKE these medications    acetaminophen 500 MG tablet Commonly known as: TYLENOL Take 2 tablets (1,000 mg total) by mouth every 6 (six) hours. What changed:  how much to take when to take this   amLODipine 5 MG tablet Commonly known as: NORVASC Take 1 tablet (5 mg total) by mouth daily.   atenolol 25 MG tablet Commonly known as: TENORMIN Take 1 tablet (25 mg total) by mouth daily. What changed: how much to take   calcium carbonate 1500 (600 Ca) MG Tabs tablet Commonly known as: OSCAL Take by mouth 2 (two) times daily with a meal.   captopril 50 MG tablet Commonly known as: CAPOTEN Take 1 tablet (50 mg total) by mouth 2 (two) times daily. What changed:  how much to take when to take this   diclofenac Sodium 1 % Gel Commonly known as: Voltaren Apply 2 g topically 4 (four) times daily.   methocarbamol 500 MG tablet Commonly known as: ROBAXIN Take 1 tablet (500 mg total) by mouth every 6 (six) hours as needed for muscle spasms (muscle pain).   multivitamin tablet Take 1 tablet by mouth daily.   polyethylene glycol 17 g packet  Commonly known as: MIRALAX / GLYCOLAX Take 17 g by mouth 2 (two) times daily.   rosuvastatin 5 MG tablet Commonly known as: CRESTOR Take 1 tablet (5 mg total) by mouth daily.   Synthroid 50 MCG tablet Generic drug: levothyroxine Take 1 tablet (50 mcg total) by mouth daily.   traMADol 50 MG tablet Commonly known as: ULTRAM Take 1 tablet (50 mg total) by mouth every 6 (six) hours as needed for severe pain.   Xarelto 20 MG Tabs tablet Generic drug: rivaroxaban Take 1 tablet (20 mg total) by mouth daily with supper.                Discharge Care Instructions  (From admission, onward)           Start     Ordered   11/24/22 0000  Change dressing       Comments: Maintain surgical dressing until follow up in the clinic. If the edges start to pull up, may reinforce with tape. If the dressing is no longer working, may remove and cover with gauze and tape, but must keep the area dry and clean.  Call with any questions or concerns.   11/24/22 0738            Diagnostic Studies: DG Pelvis Portable  Result Date: 11/23/2022 CLINICAL DATA:  Status post total hip arthroplasty EXAM: PORTABLE PELVIS 1 VIEWS COMPARISON:  09/13/2021 FINDINGS: Status post right total hip arthroplasty. No periprosthetic lucency or fracture. Near anatomic alignment of the prosthetic components. Air within the soft tissues is not unexpected postoperatively. Redemonstrated severe degenerative changes in the left hip. IMPRESSION: Expected postoperative appearance status post right total hip arthroplasty. Electronically Signed   By: Merilyn Baba M.D.   On: 11/23/2022 13:34   DG HIP UNILAT WITH PELVIS 1V RIGHT  Result Date: 11/23/2022 CLINICAL DATA:  Right hip surgery, elective. EXAM: DG HIP (WITH OR WITHOUT PELVIS) 1V RIGHT COMPARISON:  Pelvic and hip radiographs 12/06/2021. FINDINGS: C-arm fluoroscopy was provided in the operating room without the presence of a radiologist.12 seconds fluoroscopy time. 1.2 mGy air kerma. Two C-arm fluoroscopic images were obtained intraoperatively and are submitted for post operative interpretation. The submitted images demonstrate the placement of a screw fixed acetabular component. Femoral Sizer is in place. No acute osseous findings are identified. Left hip degenerative changes noted. Please see intraoperative findings for further detail. IMPRESSION: Intraoperative fluoroscopic guidance for right hip arthroplasty. Electronically Signed   By: Richardean Sale M.D.   On: 11/23/2022 12:57   DG C-Arm 1-60  Min-No Report  Result Date: 11/23/2022 Fluoroscopy was utilized by the requesting physician.  No radiographic interpretation.    Disposition: Discharge disposition: 01-Home or Self Care       Discharge Instructions     Call MD / Call 911   Complete by: As directed    If you experience chest pain or shortness of breath, CALL 911 and be transported to the hospital emergency room.  If you develope a fever above 101 F, pus (white drainage) or increased drainage or redness at the wound, or calf pain, call your surgeon's office.   Change dressing   Complete by: As directed    Maintain surgical dressing until follow up in the clinic. If the edges start to pull up, may reinforce with tape. If the dressing is no longer working, may remove and cover with gauze and tape, but must keep the area dry and clean.  Call with any questions or concerns.  Constipation Prevention   Complete by: As directed    Drink plenty of fluids.  Prune juice may be helpful.  You may use a stool softener, such as Colace (over the counter) 100 mg twice a day.  Use MiraLax (over the counter) for constipation as needed.   Diet - low sodium heart healthy   Complete by: As directed    Increase activity slowly as tolerated   Complete by: As directed    Weight bearing as tolerated with assist device (walker, cane, etc) as directed, use it as long as suggested by your surgeon or therapist, typically at least 4-6 weeks.   Post-operative opioid taper instructions:   Complete by: As directed    POST-OPERATIVE OPIOID TAPER INSTRUCTIONS: It is important to wean off of your opioid medication as soon as possible. If you do not need pain medication after your surgery it is ok to stop day one. Opioids include: Codeine, Hydrocodone(Norco, Vicodin), Oxycodone(Percocet, oxycontin) and hydromorphone amongst others.  Long term and even short term use of opiods can cause: Increased pain  response Dependence Constipation Depression Respiratory depression And more.  Withdrawal symptoms can include Flu like symptoms Nausea, vomiting And more Techniques to manage these symptoms Hydrate well Eat regular healthy meals Stay active Use relaxation techniques(deep breathing, meditating, yoga) Do Not substitute Alcohol to help with tapering If you have been on opioids for less than two weeks and do not have pain than it is ok to stop all together.  Plan to wean off of opioids This plan should start within one week post op of your joint replacement. Maintain the same interval or time between taking each dose and first decrease the dose.  Cut the total daily intake of opioids by one tablet each day Next start to increase the time between doses. The last dose that should be eliminated is the evening dose.      TED hose   Complete by: As directed    Use stockings (TED hose) for 2 weeks on both leg(s).  You may remove them at night for sleeping.        Follow-up Information     Paralee Cancel, MD. Schedule an appointment as soon as possible for a visit in 2 week(s).   Specialty: Orthopedic Surgery Contact information: [address] [place] [address] [phone]                  Signed: Irving Copas 12/01/2022, 10:52 AM

## 2022-12-02 ENCOUNTER — Telehealth: Payer: Self-pay | Admitting: Family Medicine

## 2022-12-02 DIAGNOSIS — Z87440 Personal history of urinary (tract) infections: Secondary | ICD-10-CM

## 2022-12-03 NOTE — Addendum Note (Signed)
Addended by: Michaela Corner on: 12/03/2022 11:31 AM   Modules accepted: Orders

## 2022-12-03 NOTE — Telephone Encounter (Signed)
She only needs to leave urine if she is having symptoms.

## 2023-01-07 ENCOUNTER — Other Ambulatory Visit: Payer: Medicare Other

## 2023-01-07 DIAGNOSIS — I10 Essential (primary) hypertension: Secondary | ICD-10-CM

## 2023-01-07 DIAGNOSIS — R7303 Prediabetes: Secondary | ICD-10-CM

## 2023-01-07 DIAGNOSIS — E782 Mixed hyperlipidemia: Secondary | ICD-10-CM

## 2023-01-07 DIAGNOSIS — E039 Hypothyroidism, unspecified: Secondary | ICD-10-CM

## 2023-01-07 LAB — BAYER DCA HB A1C WAIVED: HB A1C (BAYER DCA - WAIVED): 5.4 % (ref 4.8–5.6)

## 2023-01-08 LAB — CMP14+EGFR
ALT: <5 IU/L (ref 0–32)
AST: 10 IU/L (ref 0–40)
Albumin/Globulin Ratio: 1.5 (ref 1.2–2.2)
Albumin: 4.3 g/dL (ref 3.8–4.8)
Alkaline Phosphatase: 131 IU/L — ABNORMAL HIGH (ref 44–121)
BUN/Creatinine Ratio: 12 (ref 12–28)
BUN: 7 mg/dL — ABNORMAL LOW (ref 8–27)
Bilirubin Total: 0.6 mg/dL (ref 0.0–1.2)
CO2: 23 mmol/L (ref 20–29)
Calcium: 9.9 mg/dL (ref 8.7–10.3)
Chloride: 96 mmol/L (ref 96–106)
Creatinine, Ser: 0.59 mg/dL (ref 0.57–1.00)
Globulin, Total: 2.8 g/dL (ref 1.5–4.5)
Glucose: 123 mg/dL — ABNORMAL HIGH (ref 70–99)
Potassium: 3.7 mmol/L (ref 3.5–5.2)
Sodium: 136 mmol/L (ref 134–144)
Total Protein: 7.1 g/dL (ref 6.0–8.5)
eGFR: 93 mL/min/{1.73_m2} (ref >59)

## 2023-01-08 LAB — CBC WITH DIFFERENTIAL/PLATELET
Basophils Absolute: 0.1 10*3/uL (ref 0.0–0.2)
Basos: 1 %
EOS (ABSOLUTE): 0.2 10*3/uL (ref 0.0–0.4)
Eos: 4 %
Hematocrit: 36.5 % (ref 34.0–46.6)
Hemoglobin: 11.6 g/dL (ref 11.1–15.9)
Immature Grans (Abs): 0 10*3/uL (ref 0.0–0.1)
Immature Granulocytes: 0 %
Lymphocytes Absolute: 1.1 10*3/uL (ref 0.7–3.1)
Lymphs: 26 %
MCH: 27.7 pg (ref 26.6–33.0)
MCHC: 31.8 g/dL (ref 31.5–35.7)
MCV: 87 fL (ref 79–97)
Monocytes Absolute: 0.7 10*3/uL (ref 0.1–0.9)
Monocytes: 17 %
Neutrophils Absolute: 2.1 10*3/uL (ref 1.4–7.0)
Neutrophils: 52 %
Platelets: 305 10*3/uL (ref 150–450)
RBC: 4.19 x10E6/uL (ref 3.77–5.28)
RDW: 14.3 % (ref 11.7–15.4)
WBC: 4.2 10*3/uL (ref 3.4–10.8)

## 2023-01-08 LAB — THYROID PANEL WITH TSH
Free Thyroxine Index: 3.1 (ref 1.2–4.9)
T3 Uptake Ratio: 33 % (ref 24–39)
T4, Total: 9.5 ug/dL (ref 4.5–12.0)
TSH: 1.92 u[IU]/mL (ref 0.450–4.500)

## 2023-01-08 LAB — LIPID PANEL
Chol/HDL Ratio: 2.8 ratio (ref 0.0–4.4)
Cholesterol, Total: 138 mg/dL (ref 100–199)
HDL: 49 mg/dL (ref >39)
LDL Chol Calc (NIH): 73 mg/dL (ref 0–99)
Triglycerides: 86 mg/dL (ref 0–149)
VLDL Cholesterol Cal: 16 mg/dL (ref 5–40)

## 2023-01-10 ENCOUNTER — Encounter: Payer: Self-pay | Admitting: Family Medicine

## 2023-01-10 ENCOUNTER — Ambulatory Visit (INDEPENDENT_AMBULATORY_CARE_PROVIDER_SITE_OTHER): Payer: Medicare Other | Admitting: Family Medicine

## 2023-01-10 VITALS — BP 156/77 | HR 55 | Ht 64.0 in | Wt 133.0 lb

## 2023-01-10 DIAGNOSIS — E782 Mixed hyperlipidemia: Secondary | ICD-10-CM | POA: Diagnosis not present

## 2023-01-10 DIAGNOSIS — I10 Essential (primary) hypertension: Secondary | ICD-10-CM

## 2023-01-10 DIAGNOSIS — R7303 Prediabetes: Secondary | ICD-10-CM | POA: Diagnosis not present

## 2023-01-10 DIAGNOSIS — E039 Hypothyroidism, unspecified: Secondary | ICD-10-CM

## 2023-01-10 NOTE — Progress Notes (Signed)
BP (!) 156/77   Pulse (!) 55   Ht 5\' 4"  (1.626 m)   Wt 133 lb (60.3 kg)   SpO2 98%   BMI 22.83 kg/m    Subjective:   Patient ID: April Duke, female    DOB: 03-Sep-1946, 77 y.o.   MRN: [id number]  HPI: April Duke is a 77 y.o. female presenting on 01/10/2023 for Medical Management of Chronic Issues, Hypertension, and Hypothyroidism   HPI Prediabetes Patient comes in today for recheck of his diabetes. Patient has been currently taking no medication, has been diet controlled and A1c is 5.4. Patient is currently on an ACE inhibitor/ARB. Patient has not seen an ophthalmologist this year. Patient denies any issues with their feet. The symptom started onset as an adult hypertension and hyperlipidemia and hypothyroidism ARE RELATED TO DM   Hypertension Patient is currently on captopril and atenolol, and their blood pressure today is 156/67. Patient denies any lightheadedness or dizziness. Patient denies headaches, blurred vision, chest pains, shortness of breath, or weakness. Denies any side effects from medication and is content with current medication.   Hyperlipidemia Patient is coming in for recheck of his hyperlipidemia. The patient is currently taking Crestor. They deny any issues with myalgias or history of liver damage from it. They deny any focal numbness or weakness or chest pain.   Hypothyroidism recheck Patient is coming in for thyroid recheck today as well. They deny any issues with hair changes or heat or cold problems or diarrhea or constipation. They deny any chest pain or palpitations. They are currently on levothyroxine 50 micrograms   Patient had a recent hip replacement of the right hip.  And has little swelling in right leg since the pain is getting better.  She does have some pain in her left hip and lower back.  She is also having to use a walker hunched over.  Relevant past medical, surgical, family and social history reviewed and updated as indicated. Interim medical  history since our last visit reviewed. Allergies and medications reviewed and updated.  Review of Systems  Constitutional:  Negative for chills and fever.  HENT:  Negative for congestion, ear discharge and ear pain.   Eyes:  Negative for redness and visual disturbance.  Respiratory:  Negative for chest tightness and shortness of breath.   Cardiovascular:  Negative for chest pain and leg swelling.  Genitourinary:  Negative for difficulty urinating and dysuria.  Musculoskeletal:  Negative for back pain and gait problem.  Skin:  Negative for rash.  Neurological:  Negative for dizziness, light-headedness and headaches.  Psychiatric/Behavioral:  Negative for agitation and behavioral problems.   All other systems reviewed and are negative.   Per HPI unless specifically indicated above   Allergies as of 01/10/2023       Reactions   Alcohol Palpitations   Other reaction(s): RAPID HEARTRATE Other reaction(s): RAPID HEARTRATE Other reaction(s): RAPID HEARTRATE   Sulfa Antibiotics Rash   Ethanol    Other reaction(s): RAPID HEARTRATE         Medication List        Accurate as of January 10, 2023 10:15 AM. If you have any questions, ask your nurse or doctor.          STOP taking these medications    cephALEXin 500 MG capsule Commonly known as: Keflex Stopped by: Worthy Rancher, MD   methocarbamol 500 MG tablet Commonly known as: ROBAXIN Stopped by: Worthy Rancher, MD   traMADol 50 MG tablet  Commonly known as: ULTRAM Stopped by: Worthy Rancher, MD       TAKE these medications    acetaminophen 500 MG tablet Commonly known as: TYLENOL Take 2 tablets (1,000 mg total) by mouth every 6 (six) hours.   amLODipine 5 MG tablet Commonly known as: NORVASC Take 1 tablet (5 mg total) by mouth daily.   atenolol 25 MG tablet Commonly known as: TENORMIN Take 1 tablet (25 mg total) by mouth daily. What changed: how much to take   calcium carbonate 1500 (600 Ca)  MG Tabs tablet Commonly known as: OSCAL Take by mouth 2 (two) times daily with a meal.   captopril 50 MG tablet Commonly known as: CAPOTEN Take 1 tablet (50 mg total) by mouth 2 (two) times daily. What changed:  how much to take when to take this   diclofenac Sodium 1 % Gel Commonly known as: Voltaren Apply 2 g topically 4 (four) times daily.   multivitamin tablet Take 1 tablet by mouth daily.   polyethylene glycol 17 g packet Commonly known as: MIRALAX / GLYCOLAX Take 17 g by mouth 2 (two) times daily.   rosuvastatin 5 MG tablet Commonly known as: CRESTOR Take 1 tablet (5 mg total) by mouth daily.   Synthroid 50 MCG tablet Generic drug: levothyroxine Take 1 tablet (50 mcg total) by mouth daily.   Xarelto 20 MG Tabs tablet Generic drug: rivaroxaban Take 1 tablet (20 mg total) by mouth daily with supper.         Objective:   BP (!) 156/77   Pulse (!) 55   Ht 5\' 4"  (1.626 m)   Wt 133 lb (60.3 kg)   SpO2 98%   BMI 22.83 kg/m   Wt Readings from Last 3 Encounters:  01/10/23 133 lb (60.3 kg)  11/23/22 130 lb (59 kg)  11/11/22 130 lb (59 kg)    Physical Exam Vitals and nursing note reviewed.  Constitutional:      General: She is not in acute distress.    Appearance: She is well-developed. She is not diaphoretic.  Eyes:     Conjunctiva/sclera: Conjunctivae normal.  Cardiovascular:     Rate and Rhythm: Normal rate and regular rhythm.     Heart sounds: Normal heart sounds. No murmur heard. Pulmonary:     Effort: Pulmonary effort is normal. No respiratory distress.     Breath sounds: Normal breath sounds. No wheezing.  Musculoskeletal:        General: Swelling (1+ edema in right lower extremity) present. No tenderness. Normal range of motion.  Skin:    General: Skin is warm and dry.     Findings: No rash.  Neurological:     Mental Status: She is alert and oriented to person, place, and time.     Coordination: Coordination normal.  Psychiatric:         Behavior: Behavior normal.     Results for orders placed or performed in visit on 01/07/23  CBC with Differential/Platelet  Result Value Ref Range   WBC 4.2 3.4 - 10.8 x10E3/uL   RBC 4.19 3.77 - 5.28 x10E6/uL   Hemoglobin 11.6 11.1 - 15.9 g/dL   Hematocrit 36.5 34.0 - 46.6 %   MCV 87 79 - 97 fL   MCH 27.7 26.6 - 33.0 pg   MCHC 31.8 31.5 - 35.7 g/dL   RDW 14.3 11.7 - 15.4 %   Platelets 305 150 - 450 x10E3/uL   Neutrophils 52 Not Estab. %   Lymphs  26 Not Estab. %   Monocytes 17 Not Estab. %   Eos 4 Not Estab. %   Basos 1 Not Estab. %   Neutrophils Absolute 2.1 1.4 - 7.0 x10E3/uL   Lymphocytes Absolute 1.1 0.7 - 3.1 x10E3/uL   Monocytes Absolute 0.7 0.1 - 0.9 x10E3/uL   EOS (ABSOLUTE) 0.2 0.0 - 0.4 x10E3/uL   Basophils Absolute 0.1 0.0 - 0.2 x10E3/uL   Immature Granulocytes 0 Not Estab. %   Immature Grans (Abs) 0.0 0.0 - 0.1 x10E3/uL  CMP14+EGFR  Result Value Ref Range   Glucose 123 (H) 70 - 99 mg/dL   BUN 7 (L) 8 - 27 mg/dL   Creatinine, Ser 0.59 0.57 - 1.00 mg/dL   eGFR 93 >59 mL/min/1.73   BUN/Creatinine Ratio 12 12 - 28   Sodium 136 134 - 144 mmol/L   Potassium 3.7 3.5 - 5.2 mmol/L   Chloride 96 96 - 106 mmol/L   CO2 23 20 - 29 mmol/L   Calcium 9.9 8.7 - 10.3 mg/dL   Total Protein 7.1 6.0 - 8.5 g/dL   Albumin 4.3 3.8 - 4.8 g/dL   Globulin, Total 2.8 1.5 - 4.5 g/dL   Albumin/Globulin Ratio 1.5 1.2 - 2.2   Bilirubin Total 0.6 0.0 - 1.2 mg/dL   Alkaline Phosphatase 131 (H) 44 - 121 IU/L   AST 10 0 - 40 IU/L   ALT <5 0 - 32 IU/L  Lipid panel  Result Value Ref Range   Cholesterol, Total 138 100 - 199 mg/dL   Triglycerides 86 0 - 149 mg/dL   HDL 49 >39 mg/dL   VLDL Cholesterol Cal 16 5 - 40 mg/dL   LDL Chol Calc (NIH) 73 0 - 99 mg/dL   Chol/HDL Ratio 2.8 0.0 - 4.4 ratio  Thyroid Panel With TSH  Result Value Ref Range   TSH 1.920 0.450 - 4.500 uIU/mL   T4, Total 9.5 4.5 - 12.0 ug/dL   T3 Uptake Ratio 33 24 - 39 %   Free Thyroxine Index 3.1 1.2 - 4.9  Bayer DCA  Hb A1c Waived  Result Value Ref Range   HB A1C (BAYER DCA - WAIVED) 5.4 4.8 - 5.6 %    Assessment & Plan:   Problem List Items Addressed This Visit       Cardiovascular and Mediastinum   HTN (hypertension) - Primary     Endocrine   Hypothyroid     Other   Hyperlipemia   Prediabetes    Patient has a small 1+ edema and right lower extremity, likely swelling from surgery, no pain or calf pain.  Blood work.  A1c today review looks good.  No changes Follow up plan: Return in about 4 months (around 05/12/2023), or if symptoms worsen or fail to improve, for Prediabetes and hypertension and cholesterol and thyroid..  Counseling provided for all of the vaccine components No orders of the defined types were placed in this encounter.   Caryl Pina, MD [facility] 01/10/2023, 10:15 AM

## 2023-01-16 ENCOUNTER — Other Ambulatory Visit: Payer: Self-pay | Admitting: Family Medicine

## 2023-01-28 ENCOUNTER — Other Ambulatory Visit: Payer: Self-pay | Admitting: Family Medicine

## 2023-02-14 ENCOUNTER — Telehealth: Payer: Self-pay | Admitting: Cardiology

## 2023-02-14 NOTE — Telephone Encounter (Signed)
Patient son called stating patient needs medication refills.  States she was originally on Atenolol  and Catopril  back in October.  This was changed prior to surgery for concern (he is not sure what concern). Was changed to Atenolol 25 mg and catopril .  He states that her Blood pressure and her HR increased too high and so after just a few days, she changed herself back to the original dosage of Atenolol 50 mg and Catopril 25 mg. She had been on this dosage since then (October) No message to office regarding changes. He is asking for her to continue with this dose and ask for refills.  He has no BP readings of when they were high on the changed dose, for those few days.  He has no current readings and cannot tell me any range of her BP, just states "It's been good".   3/18 Last PCP OV BP 156/77 P55 3/3  ER visit 151/83  P 82 11/23 OV 137/67 P 54 10/25  OV 140/72 P 48  Please advise

## 2023-02-14 NOTE — Telephone Encounter (Signed)
Pt c/o medication issue:  1. Name of Medication:   atenolol (TENORMIN) 25 MG tablet  captopril (CAPOTEN) 50 MG tablet   2. How are you currently taking this medication (dosage and times per day)?   3. Are you having a reaction (difficulty breathing--STAT)?   4. What is your medication issue?   Son stated patient's medication was changed after she had surgery in January 2024.  Son states patient wants to go back to original prescription of Atenolol 50 mg and Captopril 25 mg.  Son stated patient will need refills of these medications sent to [facility] [address] [place] - [address] [place].

## 2023-02-16 NOTE — Telephone Encounter (Signed)
Spoke with April Duke and told that Dr Antoine Poche would like to see the pt, has not been seen since October; to go over meds/med changes. He verbalized understanding. Appt made for 02/17/23 at 1340.

## 2023-02-16 NOTE — Progress Notes (Unsigned)
  Cardiology Office Note:   Date:  02/17/2023  ID:  April Duke, DOB 26-Jul-1946, MRN [id number]  History of Present Illness:   April Duke is a 77 y.o. female who was referred by Dettinger, Elige Radon, MD for evaluation of atrial fib.    She was previously seen by Dr. Billey Chang at [facility].   The patient has been on anticoagulation for several years.  She has had cardioversion in the past.     Since I last saw her I tried to change her captopril and go down on her atenolol because she was fairly bradycardic with her fibrillation.  However, she did not tolerate this and felt jittery and felt like her heart was racing at times.  She actually went back up on her atenolol and reduced her captopril.  Since I saw her she had right hip surgery and is due to have a left done hopefully next month.  She is ambulating slowly with a walker. The patient denies any new symptoms such as chest discomfort, neck or arm discomfort. There has been no new shortness of breath, PND or orthopnea. There have been no reported palpitations, presyncope or syncope.   ROS: As stated in the HPI and negative for all other systems.   Studies Reviewed:    EKG: Atrial fibrillation with a slow ventricular rate, left axis deviation, no acute ST-T wave changes.    Risk Assessment/Calculations:    CHA2DS2-VASc Score = 4  1} This indicates a 4.8% annual risk of stroke. The patient's score is based upon: CHF History: 0 HTN History: 1 Diabetes History: 0 Stroke History: 0 Vascular Disease History: 0 Age Score: 2 Gender Score: 1    Physical Exam:   VS:  BP 124/74 (BP Location: Left Arm, Patient Position: Sitting, Cuff Size: Normal)   Pulse (!) 49   Ht  (1.626 m)   Wt 124 lb 9.6 oz (56.5 kg)   SpO2 98%   BMI 21.39 kg/m    Wt Readings from Last 3 Encounters:  02/17/23 124 lb 9.6 oz (56.5 kg)  01/10/23 133 lb (60.3 kg)  11/23/22 130 lb (59 kg)     GEN: Well nourished, well developed in no acute distress NECK: No  JVD; No carotid bruits CARDIAC: IrregularRR, no murmurs, rubs, gallops RESPIRATORY:  Clear to auscultation without rales, wheezing or rhonchi  ABDOMEN: Soft, non-tender, non-distended EXTREMITIES:  No edema; No deformity   ASSESSMENT AND PLAN:   PAF:  April Duke has a CHA2DS2 - VASc score of 3.    She tolerates anticoagulation.  No change in therapy.  DYSLIPIDEMIA:   LDL was 73 with an HDL of 49.  No change in therapy.   HTN: Her blood pressure is at target.  She did not tolerate going down her atenolol.  She tolerates the low heart rate.  At this point I will continue the meds as listed on the lower dose of captopril.  PREOP: The patient is at acceptable risk for the planned hip surgery.  No further cardiovascular testing is suggested.        Signed, Rollene Rotunda, MD

## 2023-02-17 ENCOUNTER — Ambulatory Visit: Payer: Medicare Other | Attending: Cardiology | Admitting: Cardiology

## 2023-02-17 ENCOUNTER — Encounter: Payer: Self-pay | Admitting: Cardiology

## 2023-02-17 VITALS — BP 124/74 | HR 49 | Ht 64.0 in | Wt 124.6 lb

## 2023-02-17 DIAGNOSIS — I10 Essential (primary) hypertension: Secondary | ICD-10-CM

## 2023-02-17 DIAGNOSIS — E785 Hyperlipidemia, unspecified: Secondary | ICD-10-CM | POA: Diagnosis not present

## 2023-02-17 DIAGNOSIS — I480 Paroxysmal atrial fibrillation: Secondary | ICD-10-CM | POA: Diagnosis not present

## 2023-02-17 MED ORDER — ATENOLOL 50 MG PO TABS: 50.0000 mg | ORAL_TABLET | Freq: Every day | ORAL | 3 refills | Status: DC

## 2023-02-17 MED ORDER — CAPTOPRIL 25 MG PO TABS: 25.0000 mg | ORAL_TABLET | Freq: Every day | ORAL | 3 refills | Status: DC

## 2023-02-17 NOTE — Patient Instructions (Addendum)
Medication Instructions:  Your physician has recommended you make the following change in your medication:  CHANGE: Atenolol 50 mg (one tablet) once daily CHANGE: Captopril 25 mg (one tablet) once daily *If you need a refill on your cardiac medications before your next appointment, please call your pharmacy*   Lab Work: None    Testing/Procedures: None   Follow-Up: At [facility], you and your health needs are our priority.  As part of our continuing mission to provide you with exceptional heart care, we have created designated Provider Care Teams.  These Care Teams include your primary Cardiologist (physician) and Advanced Practice Providers (APPs -  Physician Assistants and Nurse Practitioners) who all work together to provide you with the care you need, when you need it.  Your next appointment:   1 year(s)  Provider:   Rollene Rotunda, MD

## 2023-02-24 ENCOUNTER — Telehealth: Payer: Self-pay

## 2023-02-24 NOTE — Telephone Encounter (Signed)
Called patient to schedule Medicare Annual Wellness Visit (AWV). Unable to reach patient.  Last date of AWV: 02/02/22  Please schedule an appointment at any time On Annual Wellness Visit Schedule.    Thank you ,  Agnes Lawrence, CMA (AAMA)  [facility]- AWV Program [phone]

## 2023-03-31 ENCOUNTER — Telehealth: Payer: Self-pay

## 2023-03-31 NOTE — Telephone Encounter (Signed)
   Pre-operative Risk Assessment    Patient Name: April Duke  DOB: 1946/05/23 MRN: [id number]      Request for Surgical Clearance    Procedure:   Left Total Hip Arthroplasty  Date of Surgery:  Clearance 05/17/23                                 Surgeon:  Dr. Durene Romans Surgeon's Group or Practice Name:  [facility] Phone number:  [phone] ATTN: Rosalva Ferron Fax number:  [phone]   Type of Clearance Requested:   - Medical    Type of Anesthesia:  Spinal   Additional requests/questions:    SignedZada Finders   03/31/2023, 10:35 AM

## 2023-03-31 NOTE — Telephone Encounter (Signed)
   Patient Name: April Duke  DOB: Feb 01, 1946 MRN: [id number]  Primary Cardiologist: Rollene Rotunda, MD  Chart reviewed as part of pre-operative protocol coverage. Pre-op clearance already addressed by colleagues in earlier phone notes. To summarize recommendations:  -PREOP: The patient is at acceptable risk for the planned hip surgery.  No further cardiovascular testing is suggested.  -Dr. Antoine Poche  Will route this bundled recommendation to requesting provider via Epic fax function and remove from pre-op pool. Please call with questions.  Sharlene Dory, PA-C 03/31/2023, 1:49 PM

## 2023-04-18 ENCOUNTER — Telehealth: Payer: Self-pay

## 2023-04-18 NOTE — Telephone Encounter (Signed)
Faxed [facility] OV notes, labs, EKG and surgical clearance form.  Fax [phone]

## 2023-04-26 NOTE — Progress Notes (Signed)
Sent message, via epic in basket, requesting orders in epic from surgeon.  

## 2023-05-03 NOTE — Patient Instructions (Signed)
SURGICAL WAITING ROOM VISITATION  Patients having surgery or a procedure may have no more than 2 support people in the waiting area - these visitors may rotate.    Children under the age of 81 must have an adult with them who is not the patient.  Due to an increase in RSV and influenza rates and associated hospitalizations, children ages 58 and under may not visit patients in [facility] hospitals.  If the patient needs to stay at the hospital during part of their recovery, the visitor guidelines for inpatient rooms apply. Pre-op nurse will coordinate an appropriate time for 1 support person to accompany patient in pre-op.  This support person may not rotate.    Please refer to the [facility] website for the visitor guidelines for Inpatients (after your surgery is over and you are in a regular room).    Your procedure is scheduled on: 05/17/23   Report to [facility] Main Entrance    Report to admitting at 7:35 AM   Call this number if you have problems the morning of surgery [phone]   Do not eat food :After Midnight.   After Midnight you may have the following liquids until 7:05 AM DAY OF SURGERY  Water Non-Citrus Juices (without pulp, NO RED-Apple, White grape, White cranberry) Black Coffee (NO MILK/CREAM OR CREAMERS, sugar ok)  Clear Tea (NO MILK/CREAM OR CREAMERS, sugar ok) regular and decaf                             Plain Jell-O (NO RED)                                           Fruit ices (not with fruit pulp, NO RED)                                     Popsicles (NO RED)                                                               Sports drinks like Gatorade (NO RED)                 The day of surgery:  Drink ONE (1) Pre-Surgery G2 at 7:05 AM the morning of surgery. Drink in one sitting. Do not sip.  This drink was given to you during your hospital  pre-op appointment visit. Nothing else to drink after completing the  Pre-Surgery G2.          If you  have questions, please contact your surgeon's office.   FOLLOW BOWEL PREP AND ANY ADDITIONAL PRE OP INSTRUCTIONS YOU RECEIVED FROM YOUR SURGEON'S OFFICE!!!     Oral Hygiene is also important to reduce your risk of infection.                                    Remember - BRUSH YOUR TEETH THE MORNING OF SURGERY WITH YOUR REGULAR TOOTHPASTE  DENTURES WILL BE REMOVED  PRIOR TO SURGERY PLEASE DO NOT APPLY "Poly grip" OR ADHESIVES!!!   Take these medicines the morning of surgery with A SIP OF WATER: Tylenol, Amlodipine, Atenolol, Rosuvastatin, Synthroid   These are anesthesia recommendations for holding your anticoagulants.  Please contact your prescribing physician to confirm IF it is safe to hold your anticoagulants for this length of time.   Eliquis Apixaban   72 hours   Xarelto Rivaroxaban   72 hours  Plavix Clopidogrel   120 hours  Pletal Cilostazol   120 hours                                You may not have any metal on your body including hair pins, jewelry, and body piercing             Do not wear make-up, lotions, powders, perfumes, or deodorant  Do not wear nail polish including gel and S&S, artificial/acrylic nails, or any other type of covering on natural nails including finger and toenails. If you have artificial nails, gel coating, etc. that needs to be removed by a nail salon please have this removed prior to surgery or surgery may need to be canceled/ delayed if the surgeon/ anesthesia feels like they are unable to be safely monitored.   Do not shave  48 hours prior to surgery.    Do not bring valuables to the hospital. [facility] IS NOT             RESPONSIBLE   FOR VALUABLES.   Contacts, glasses, dentures or bridgework may not be worn into surgery.   Bring small overnight bag day of surgery.   DO NOT BRING YOUR HOME MEDICATIONS TO THE HOSPITAL. PHARMACY WILL DISPENSE MEDICATIONS LISTED ON YOUR MEDICATION LIST TO YOU DURING YOUR ADMISSION IN THE HOSPITAL!               Please read over the following fact sheets you were given: IF YOU HAVE QUESTIONS ABOUT YOUR PRE-OP INSTRUCTIONS PLEASE CALL [phone] [place]   If you received a COVID test during your pre-op visit  it is requested that you wear a mask when out in public, stay away from anyone that may not be feeling well and notify your surgeon if you develop symptoms. If you test positive for Covid or have been in contact with anyone that has tested positive in the last 10 days please notify you surgeon.      Pre-operative 5 CHG Bath Instructions   You can play a key role in reducing the risk of infection after surgery. Your skin needs to be as free of germs as possible. You can reduce the number of germs on your skin by washing with CHG (chlorhexidine gluconate) soap before surgery. CHG is an antiseptic soap that kills germs and continues to kill germs even after washing.   DO NOT use if you have an allergy to chlorhexidine/CHG or antibacterial soaps. If your skin becomes reddened or irritated, stop using the CHG and notify one of our RNs at [phone].   Please shower with the CHG soap starting 4 days before surgery using the following schedule:     Please keep in mind the following:  DO NOT shave, including legs and underarms, starting the day of your first shower.   You may shave your face at any point before/day of surgery.  Place clean sheets on your bed the day you start using CHG  soap. Use a clean washcloth (not used since being washed) for each shower. DO NOT sleep with pets once you start using the CHG.   CHG Shower Instructions:  If you choose to wash your hair and private area, wash first with your normal shampoo/soap.  After you use shampoo/soap, rinse your hair and body thoroughly to remove shampoo/soap residue.  Turn the water OFF and apply about 3 tablespoons (45 ml) of CHG soap to a CLEAN washcloth.  Apply CHG soap ONLY FROM YOUR NECK DOWN TO YOUR TOES (washing for 3-5 minutes)   DO NOT use CHG soap on face, private areas, open wounds, or sores.  Pay special attention to the area where your surgery is being performed.  If you are having back surgery, having someone wash your back for you may be helpful. Wait 2 minutes after CHG soap is applied, then you may rinse off the CHG soap.  Pat dry with a clean towel  Put on clean clothes/pajamas   If you choose to wear lotion, please use ONLY the CHG-compatible lotions on the back of this paper.     Additional instructions for the day of surgery: DO NOT APPLY any lotions, deodorants, cologne, or perfumes.   Put on clean/comfortable clothes.  Brush your teeth.  Ask your nurse before applying any prescription medications to the skin.      CHG Compatible Lotions   Aveeno Moisturizing lotion  Cetaphil Moisturizing Cream  Cetaphil Moisturizing Lotion  Clairol Herbal Essence Moisturizing Lotion, Dry Skin  Clairol Herbal Essence Moisturizing Lotion, Extra Dry Skin  Clairol Herbal Essence Moisturizing Lotion, Normal Skin  Curel Age Defying Therapeutic Moisturizing Lotion with Alpha Hydroxy  Curel Extreme Care Body Lotion  Curel Soothing Hands Moisturizing Hand Lotion  Curel Therapeutic Moisturizing Cream, Fragrance-Free  Curel Therapeutic Moisturizing Lotion, Fragrance-Free  Curel Therapeutic Moisturizing Lotion, Original Formula  Eucerin Daily Replenishing Lotion  Eucerin Dry Skin Therapy Plus Alpha Hydroxy Crme  Eucerin Dry Skin Therapy Plus Alpha Hydroxy Lotion  Eucerin Original Crme  Eucerin Original Lotion  Eucerin Plus Crme Eucerin Plus Lotion  Eucerin TriLipid Replenishing Lotion  Keri Anti-Bacterial Hand Lotion  Keri Deep Conditioning Original Lotion Dry Skin Formula Softly Scented  Keri Deep Conditioning Original Lotion, Fragrance Free Sensitive Skin Formula  Keri Lotion Fast Absorbing Fragrance Free Sensitive Skin Formula  Keri Lotion Fast Absorbing Softly Scented Dry Skin Formula  Keri Original  Lotion  Keri Skin Renewal Lotion Keri Silky Smooth Lotion  Keri Silky Smooth Sensitive Skin Lotion  Nivea Body Creamy Conditioning Oil  Nivea Body Extra Enriched Teacher, adult education Moisturizing Lotion Nivea Crme  Nivea Skin Firming Lotion  NutraDerm 30 Skin Lotion  NutraDerm Skin Lotion  NutraDerm Therapeutic Skin Cream  NutraDerm Therapeutic Skin Lotion  ProShield Protective Hand Cream  Provon moisturizing lotion  WHAT IS A BLOOD TRANSFUSION? Blood Transfusion Information  A transfusion is the replacement of blood or some of its parts. Blood is made up of multiple cells which provide different functions. Red blood cells carry oxygen and are used for blood loss replacement. White blood cells fight against infection. Platelets control bleeding. Plasma helps clot blood. Other blood products are available for specialized needs, such as hemophilia or other clotting disorders. BEFORE THE TRANSFUSION  Who gives blood for transfusions?  Healthy volunteers who are fully evaluated to make sure their blood is safe. This is blood bank blood. Transfusion therapy is the safest it has ever been in the  practice of medicine. Before blood is taken from a donor, a complete history is taken to make sure that person has no history of diseases nor engages in risky social behavior (examples are intravenous drug use or sexual activity with multiple partners). The donor's travel history is screened to minimize risk of transmitting infections, such as malaria. The donated blood is tested for signs of infectious diseases, such as HIV and hepatitis. The blood is then tested to be sure it is compatible with you in order to minimize the chance of a transfusion reaction. If you or a relative donates blood, this is often done in anticipation of surgery and is not appropriate for emergency situations. It takes many days to process the donated blood. RISKS AND COMPLICATIONS Although  transfusion therapy is very safe and saves many lives, the main dangers of transfusion include:  Getting an infectious disease. Developing a transfusion reaction. This is an allergic reaction to something in the blood you were given. Every precaution is taken to prevent this. The decision to have a blood transfusion has been considered carefully by your caregiver before blood is given. Blood is not given unless the benefits outweigh the risks. AFTER THE TRANSFUSION Right after receiving a blood transfusion, you will usually feel much better and more energetic. This is especially true if your red blood cells have gotten low (anemic). The transfusion raises the level of the red blood cells which carry oxygen, and this usually causes an energy increase. The nurse administering the transfusion will monitor you carefully for complications. HOME CARE INSTRUCTIONS  No special instructions are needed after a transfusion. You may find your energy is better. Speak with your caregiver about any limitations on activity for underlying diseases you may have. SEEK MEDICAL CARE IF:  Your condition is not improving after your transfusion. You develop redness or irritation at the intravenous (IV) site. SEEK IMMEDIATE MEDICAL CARE IF:  Any of the following symptoms occur over the next 12 hours: Shaking chills. You have a temperature by mouth above 102 F (38.9 C), not controlled by medicine. Chest, back, or muscle pain. People around you feel you are not acting correctly or are confused. Shortness of breath or difficulty breathing. Dizziness and fainting. You get a rash or develop hives. You have a decrease in urine output. Your urine turns a dark color or changes to pink, red, or brown. Any of the following symptoms occur over the next 10 days: You have a temperature by mouth above 102 F (38.9 C), not controlled by medicine. Shortness of breath. Weakness after normal activity. The white part of the eye  turns yellow (jaundice). You have a decrease in the amount of urine or are urinating less often. Your urine turns a dark color or changes to pink, red, or brown. Document Released: 10/08/2000 Document Revised: 01/03/2012 Document Reviewed: 05/27/2008 [employer] Patient Information 2014 [place], [place].  _______________________________________________________________________  Incentive Spirometer  An incentive spirometer is a tool that can help keep your lungs clear and active. This tool measures how well you are filling your lungs with each breath. Taking long deep breaths may help reverse or decrease the chance of developing breathing (pulmonary) problems (especially infection) following: A long period of time when you are unable to move or be active. BEFORE THE PROCEDURE  If the spirometer includes an indicator to show your best effort, your nurse or respiratory therapist will set it to a desired goal. If possible, sit up straight or lean slightly forward. Try not to  slouch. Hold the incentive spirometer in an upright position. INSTRUCTIONS FOR USE  Sit on the edge of your bed if possible, or sit up as far as you can in bed or on a chair. Hold the incentive spirometer in an upright position. Breathe out normally. Place the mouthpiece in your mouth and seal your lips tightly around it. Breathe in slowly and as deeply as possible, raising the piston or the ball toward the top of the column. Hold your breath for 3-5 seconds or for as long as possible. Allow the piston or ball to fall to the bottom of the column. Remove the mouthpiece from your mouth and breathe out normally. Rest for a few seconds and repeat Steps 1 through 7 at least 10 times every 1-2 hours when you are awake. Take your time and take a few normal breaths between deep breaths. The spirometer may include an indicator to show your best effort. Use the indicator as a goal to work toward during each repetition. After each set of  10 deep breaths, practice coughing to be sure your lungs are clear. If you have an incision (the cut made at the time of surgery), support your incision when coughing by placing a pillow or rolled up towels firmly against it. Once you are able to get out of bed, walk around indoors and cough well. You may stop using the incentive spirometer when instructed by your caregiver.  RISKS AND COMPLICATIONS Take your time so you do not get dizzy or light-headed. If you are in pain, you may need to take or ask for pain medication before doing incentive spirometry. It is harder to take a deep breath if you are having pain. AFTER USE Rest and breathe slowly and easily. It can be helpful to keep track of a log of your progress. Your caregiver can provide you with a simple table to help with this. If you are using the spirometer at home, follow these instructions: SEEK MEDICAL CARE IF:  You are having difficultly using the spirometer. You have trouble using the spirometer as often as instructed. Your pain medication is not giving enough relief while using the spirometer. You develop fever of 100.5 F (38.1 C) or higher. SEEK IMMEDIATE MEDICAL CARE IF:  You cough up bloody sputum that had not been present before. You develop fever of 102 F (38.9 C) or greater. You develop worsening pain at or near the incision site. MAKE SURE YOU:  Understand these instructions. Will watch your condition. Will get help right away if you are not doing well or get worse. Document Released: 02/21/2007 Document Revised: 01/03/2012 Document Reviewed: 04/24/2007 [facility] Patient Information 2014 [place], [place].   ________________________________________________________________________

## 2023-05-03 NOTE — Progress Notes (Signed)
COVID Vaccine Completed:  Date of COVID positive in last 90 days:  PCP - Arville Care, MD Cardiologist - Rollene Rotunda, MD LOV 02/17/23  Cardiac clearance by Jari Favre, PA 03/31/23 in Epic  Chest x-ray -  EKG - 02/17/23 Epic Stress Test -  ECHO -  Cardiac Cath -  Pacemaker/ICD device last checked: Spinal Cord Stimulator:  Bowel Prep -   Sleep Study -  CPAP -   Fasting Blood Sugar - preDM Checks Blood Sugar _____ times a day  Last dose of GLP1 agonist-  N/A GLP1 instructions:  N/A   Last dose of SGLT-2 inhibitors-  N/A SGLT-2 instructions: N/A   Blood Thinner Instructions:  Xarelto Aspirin Instructions: Last Dose:  Activity level:  Can go up a flight of stairs and perform activities of daily living without stopping and without symptoms of chest pain or shortness of breath.  Able to exercise without symptoms  Unable to go up a flight of stairs without symptoms of     Anesthesia review: a fib, HTN, preDM,   Patient denies shortness of breath, fever, cough and chest pain at PAT appointment  Patient verbalized understanding of instructions that were given to them at the PAT appointment. Patient was also instructed that they will need to review over the PAT instructions again at home before surgery.

## 2023-05-04 ENCOUNTER — Encounter (HOSPITAL_COMMUNITY)
Admission: RE | Admit: 2023-05-04 | Discharge: 2023-05-04 | Disposition: A | Discharge status: Home / Self Care | Payer: Medicare Other | Source: Ambulatory Visit | Attending: Orthopedic Surgery | Admitting: Orthopedic Surgery

## 2023-05-04 ENCOUNTER — Other Ambulatory Visit: Payer: Self-pay

## 2023-05-04 ENCOUNTER — Encounter (HOSPITAL_COMMUNITY): Payer: Self-pay

## 2023-05-04 VITALS — BP 147/65 | HR 83 | Temp 98.6°F | Resp 14 | Ht 64.0 in | Wt 124.6 lb

## 2023-05-04 DIAGNOSIS — Z01812 Encounter for preprocedural laboratory examination: Secondary | ICD-10-CM | POA: Insufficient documentation

## 2023-05-04 DIAGNOSIS — I10 Essential (primary) hypertension: Secondary | ICD-10-CM | POA: Diagnosis not present

## 2023-05-04 DIAGNOSIS — M1612 Unilateral primary osteoarthritis, left hip: Secondary | ICD-10-CM | POA: Diagnosis not present

## 2023-05-04 DIAGNOSIS — Z01818 Encounter for other preprocedural examination: Secondary | ICD-10-CM

## 2023-05-04 LAB — TYPE AND SCREEN
ABO/RH(D): A POS
Antibody Screen: NEGATIVE

## 2023-05-04 LAB — BASIC METABOLIC PANEL
Anion gap: 8 (ref 5–15)
BUN: 11 mg/dL (ref 8–23)
CO2: 25 mmol/L (ref 22–32)
Calcium: 9.2 mg/dL (ref 8.9–10.3)
Chloride: 101 mmol/L (ref 98–111)
Creatinine, Ser: 0.66 mg/dL (ref 0.44–1.00)
GFR, Estimated: >60 mL/min (ref >60)
Glucose, Bld: 101 mg/dL — ABNORMAL HIGH (ref 70–99)
Potassium: 3.9 mmol/L (ref 3.5–5.1)
Sodium: 134 mmol/L — ABNORMAL LOW (ref 135–145)

## 2023-05-04 LAB — CBC
HCT: 36.4 % (ref 36.0–46.0)
Hemoglobin: 11.7 g/dL — ABNORMAL LOW (ref 12.0–15.0)
MCH: 28 pg (ref 26.0–34.0)
MCHC: 32.1 g/dL (ref 30.0–36.0)
MCV: 87.1 fL (ref 80.0–100.0)
Platelets: 239 10*3/uL (ref 150–400)
RBC: 4.18 MIL/uL (ref 3.87–5.11)
RDW: 15.6 % — ABNORMAL HIGH (ref 11.5–15.5)
WBC: 4.8 10*3/uL (ref 4.0–10.5)
nRBC: 0 % (ref 0.0–0.2)

## 2023-05-04 LAB — SURGICAL PCR SCREEN
MRSA, PCR: NEGATIVE
Staphylococcus aureus: NEGATIVE

## 2023-05-09 ENCOUNTER — Other Ambulatory Visit: Payer: Self-pay

## 2023-05-09 ENCOUNTER — Other Ambulatory Visit: Payer: Medicare Other

## 2023-05-09 DIAGNOSIS — I10 Essential (primary) hypertension: Secondary | ICD-10-CM

## 2023-05-09 DIAGNOSIS — E782 Mixed hyperlipidemia: Secondary | ICD-10-CM

## 2023-05-09 DIAGNOSIS — E039 Hypothyroidism, unspecified: Secondary | ICD-10-CM

## 2023-05-09 DIAGNOSIS — R7303 Prediabetes: Secondary | ICD-10-CM

## 2023-05-09 LAB — BAYER DCA HB A1C WAIVED: HB A1C (BAYER DCA - WAIVED): 6.1 % — ABNORMAL HIGH (ref 4.8–5.6)

## 2023-05-09 NOTE — Anesthesia Preprocedure Evaluation (Addendum)
Anesthesia Evaluation  Patient identified by MRN, date of birth, ID band Patient awake    Reviewed: Allergy & Precautions, H&P , NPO status , Patient's Chart, lab work & pertinent test results, reviewed documented beta blocker date and time   Airway Mallampati: II  TM Distance: >3 FB Neck ROM: Full    Dental no notable dental hx. (+) Partial Upper, Partial Lower, Dental Advisory Given   Pulmonary neg pulmonary ROS   Pulmonary exam normal breath sounds clear to auscultation       Cardiovascular hypertension, Pt. on medications and Pt. on home beta blockers + dysrhythmias Atrial Fibrillation  Rhythm:Regular Rate:Normal     Neuro/Psych negative neurological ROS  negative psych ROS   GI/Hepatic Neg liver ROS,GERD  Medicated,,  Endo/Other  Hypothyroidism    Renal/GU negative Renal ROS  negative genitourinary   Musculoskeletal  (+) Arthritis , Osteoarthritis,    Abdominal   Peds  Hematology negative hematology ROS (+)   Anesthesia Other Findings   Reproductive/Obstetrics negative OB ROS                             Anesthesia Physical Anesthesia Plan  ASA: 3  Anesthesia Plan: Spinal   Post-op Pain Management: Tylenol PO (pre-op)*   Induction: Intravenous  PONV Risk Score and Plan: 3 and Ondansetron, Dexamethasone and Propofol infusion  Airway Management Planned: Natural Airway and Simple Face Mask  Additional Equipment:   Intra-op Plan:   Post-operative Plan:   Informed Consent: I have reviewed the patients History and Physical, chart, labs and discussed the procedure including the risks, benefits and alternatives for the proposed anesthesia with the patient or authorized representative who has indicated his/her understanding and acceptance.     Dental advisory given  Plan Discussed with: CRNA  Anesthesia Plan Comments: (See PAT note 05/04/2023)       Anesthesia Quick  Evaluation

## 2023-05-09 NOTE — Progress Notes (Signed)
Anesthesia Chart Review   Case: [id number] Date/Time: 05/17/23 0950   Procedure: TOTAL HIP ARTHROPLASTY ANTERIOR APPROACH (Left: Hip)   Anesthesia type: Spinal   Pre-op diagnosis: Left hip osteoarthritis   Location: [facility] ROOM 09 / [facility]   Surgeons: Durene Romans, MD       DISCUSSION:77 y.o. never smoker with h/o HTN, atrial fibrillation, left hip OA scheduled for above procedure 05/17/2023 with Dr. Durene Romans.   Pt last seen by cardiology 02/17/2023. Per OV note, "PREOP: The patient is at acceptable risk for the planned hip surgery.  No further cardiovascular testing is suggested."  Advised to hold Xarelto 3 days prior to procedure. Last dose 05/13/23.  VS: BP (!) 147/65   Pulse 83   Temp 37 C (Oral)   Resp 14   Ht 5\' 4"  (1.626 m)   Wt 56.5 kg   SpO2 100%   BMI 21.38 kg/m   PROVIDERS: Dettinger, Elige Radon, MD is PCP   Cardiologist - Rollene Rotunda, MD  LABS: Labs reviewed: Acceptable for surgery. (all labs ordered are listed, but only abnormal results are displayed)  Labs Reviewed  BASIC METABOLIC PANEL - Abnormal; Notable for the following components:      Result Value   Sodium 134 (*)    Glucose, Bld 101 (*)    All other components within normal limits  CBC - Abnormal; Notable for the following components:   Hemoglobin 11.7 (*)    RDW 15.6 (*)    All other components within normal limits  SURGICAL PCR SCREEN  TYPE AND SCREEN     IMAGES:   EKG:   CV:  Past Medical History:  Diagnosis Date   Atrial fibrillation (HCC)    Hyperlipidemia    Hypertension    Hypothyroidism    Osteoporosis    Pneumonia    Thyroid disease     Past Surgical History:  Procedure Laterality Date   CARDIOVERSION     TOTAL HIP ARTHROPLASTY Right 11/23/2022   Procedure: TOTAL HIP ARTHROPLASTY ANTERIOR APPROACH;  Surgeon: Durene Romans, MD;  Location: [facility];  Service: Orthopedics;  Laterality: Right;    MEDICATIONS:  acetaminophen (TYLENOL) 500 MG tablet   amLODipine  (NORVASC) 5 MG tablet   atenolol (TENORMIN) 50 MG tablet   calcium carbonate (OSCAL) 1500 (600 Ca) MG TABS tablet   captopril (CAPOTEN) 25 MG tablet   Multiple Vitamin (MULTIVITAMIN) tablet   rosuvastatin (CRESTOR) 5 MG tablet   SYNTHROID 50 MCG tablet   XARELTO 20 MG TABS tablet   No current facility-administered medications for this encounter.   Jodell Cipro Ward, PA-C [facility] Pre-Surgical Testing [phone]

## 2023-05-10 LAB — CMP14+EGFR
ALT: 11 IU/L (ref 0–32)
AST: 21 IU/L (ref 0–40)
Albumin: 4.3 g/dL (ref 3.8–4.8)
Alkaline Phosphatase: 94 IU/L (ref 44–121)
BUN/Creatinine Ratio: 14 (ref 12–28)
BUN: 8 mg/dL (ref 8–27)
Bilirubin Total: 0.5 mg/dL (ref 0.0–1.2)
CO2: 22 mmol/L (ref 20–29)
Calcium: 9.9 mg/dL (ref 8.7–10.3)
Chloride: 100 mmol/L (ref 96–106)
Creatinine, Ser: 0.59 mg/dL (ref 0.57–1.00)
Globulin, Total: 3 g/dL (ref 1.5–4.5)
Glucose: 92 mg/dL (ref 70–99)
Potassium: 4 mmol/L (ref 3.5–5.2)
Sodium: 138 mmol/L (ref 134–144)
Total Protein: 7.3 g/dL (ref 6.0–8.5)
eGFR: 92 mL/min/{1.73_m2} (ref >59)

## 2023-05-10 LAB — LIPID PANEL
Chol/HDL Ratio: 2.3 ratio (ref 0.0–4.4)
Cholesterol, Total: 128 mg/dL (ref 100–199)
HDL: 55 mg/dL (ref >39)
LDL Chol Calc (NIH): 57 mg/dL (ref 0–99)
Triglycerides: 80 mg/dL (ref 0–149)
VLDL Cholesterol Cal: 16 mg/dL (ref 5–40)

## 2023-05-10 LAB — CBC WITH DIFFERENTIAL/PLATELET
Basophils Absolute: 0.1 10*3/uL (ref 0.0–0.2)
Basos: 1 %
EOS (ABSOLUTE): 0.2 10*3/uL (ref 0.0–0.4)
Eos: 4 %
Hematocrit: 39.8 % (ref 34.0–46.6)
Hemoglobin: 12.7 g/dL (ref 11.1–15.9)
Immature Grans (Abs): 0 10*3/uL (ref 0.0–0.1)
Immature Granulocytes: 0 %
Lymphocytes Absolute: 1.5 10*3/uL (ref 0.7–3.1)
Lymphs: 33 %
MCH: 27.9 pg (ref 26.6–33.0)
MCHC: 31.9 g/dL (ref 31.5–35.7)
MCV: 87 fL (ref 79–97)
Monocytes Absolute: 0.7 10*3/uL (ref 0.1–0.9)
Monocytes: 16 %
Neutrophils Absolute: 2.1 10*3/uL (ref 1.4–7.0)
Neutrophils: 46 %
Platelets: 237 10*3/uL (ref 150–450)
RBC: 4.56 x10E6/uL (ref 3.77–5.28)
RDW: 15.1 % (ref 11.7–15.4)
WBC: 4.6 10*3/uL (ref 3.4–10.8)

## 2023-05-10 LAB — TSH: TSH: 1.78 u[IU]/mL (ref 0.450–4.500)

## 2023-05-12 ENCOUNTER — Ambulatory Visit: Payer: Medicare Other | Admitting: Family Medicine

## 2023-05-16 NOTE — H&P (Signed)
TOTAL HIP ADMISSION H&P  Patient is admitted for left total hip arthroplasty.  Therapy Plans: HEP Disposition: Home with son & husband Planned DVT Prophylaxis: Xarelto 20 mg DME needed: walker (prefers a new one if possible) PCP: Cardio: Dr. Antoine Poche - clearance received TXA: IV Allergies: caffeine/alcohol - a fib , sulfa - rash Anesthesia Concerns: none BMI: 20.6 Last HgbA1c: Not diabetic  Other: - A fib - prefers minimal meds (doesn't like to take tylenol even) - tylenol, methocarbamol, tramadol - had to go to ER for UTI last time -- will send with abx, prefers foley still   Subjective:  Chief Complaint: left hip pain  HPI: April Duke, 77 y.o. female, has a history of pain and functional disability in the left hip(s) due to arthritis and patient has failed non-surgical conservative treatments for greater than 12 weeks to include NSAID's and/or analgesics and activity modification.  Onset of symptoms was gradual starting 2 years ago with gradually worsening course since that time.The patient noted no past surgery on the left hip(s).  Patient currently rates pain in the left hip at 8 out of 10 with activity. Patient has worsening of pain with activity and weight bearing, pain that interfers with activities of daily living, and pain with passive range of motion. Patient has evidence of joint space narrowing by imaging studies. This condition presents safety issues increasing the risk of falls. There is no current active infection.  Patient Active Problem List   Diagnosis Date Noted   S/P total right hip arthroplasty 11/23/2022   Prediabetes 09/04/2021   Arthritis of right hip 01/30/2021   A-fib (HCC) 04/27/2019   HTN (hypertension) 04/27/2019   Hyperlipemia 04/27/2019   Hypothyroid 04/27/2019   Osteoporosis 04/27/2019   GERD (gastroesophageal reflux disease) 07/02/2014   Past Medical History:  Diagnosis Date   Atrial fibrillation (HCC)    Hyperlipidemia    Hypertension     Hypothyroidism    Osteoporosis    Pneumonia    Thyroid disease     Past Surgical History:  Procedure Laterality Date   CARDIOVERSION     TOTAL HIP ARTHROPLASTY Right 11/23/2022   Procedure: TOTAL HIP ARTHROPLASTY ANTERIOR APPROACH;  Surgeon: Durene Romans, MD;  Location: [facility];  Service: Orthopedics;  Laterality: Right;    No current facility-administered medications for this encounter.   Current Outpatient Medications  Medication Sig Dispense Refill Last Dose   acetaminophen (TYLENOL) 500 MG tablet Take 2 tablets (1,000 mg total) by mouth every 6 (six) hours. (Patient taking differently: Take 1,000 mg by mouth every 6 (six) hours as needed for mild pain or moderate pain.) 30 tablet 0    amLODipine (NORVASC) 5 MG tablet Take 1 tablet by mouth once daily 90 tablet 1    atenolol (TENORMIN) 50 MG tablet Take 1 tablet (50 mg total) by mouth daily. 90 tablet 3    calcium carbonate (OSCAL) 1500 (600 Ca) MG TABS tablet Take by mouth 2 (two) times daily with a meal.      captopril (CAPOTEN) 25 MG tablet Take 1 tablet (25 mg total) by mouth daily. 90 tablet 3    Multiple Vitamin (MULTIVITAMIN) tablet Take 1 tablet by mouth daily.      rosuvastatin (CRESTOR) 5 MG tablet Take 1 tablet (5 mg total) by mouth daily. 90 tablet 3    SYNTHROID 50 MCG tablet Take 1 tablet (50 mcg total) by mouth daily. 90 tablet 3    XARELTO 20 MG TABS tablet Take 1 tablet (  20 mg total) by mouth daily with supper. 90 tablet 2    Allergies  Allergen Reactions   Alcohol Palpitations        Sulfa Antibiotics Rash   Ethanol     RAPID HEARTRATE     Social History   Tobacco Use   Smoking status: Never   Smokeless tobacco: Never  Substance Use Topics   Alcohol use: Never    Family History  Problem Relation Age of Onset   Cancer Mother        breast   Hypertension Mother    Hypertension Father    Pulmonary embolism Sister    Lung cancer Brother    Hypertension Brother    Hyperlipidemia Brother       Review of Systems  Constitutional:  Negative for chills and fever.  Respiratory:  Negative for cough and shortness of breath.   Cardiovascular:  Negative for chest pain.  Gastrointestinal:  Negative for nausea and vomiting.  Musculoskeletal:  Positive for arthralgias.     Objective:  Physical Exam Well nourished and well developed. General: Alert and oriented x3, cooperative and pleasant, no acute distress. Head: normocephalic, atraumatic, neck supple. Eyes: EOMI.  Musculoskeletal: Left hip exam: Painful and limited hip flexion internal rotation to just 5 degrees with pelvic tilting, external rotation with slight external rotation contracture  Right hip exam: Right hip range of motion is fluid and pain-free from 20 degrees of internal to 30 degrees of external rotation Active hip flexion with 5/5 strength without significant external rotation contracture   Calves soft and nontender. Motor function intact in LE. Strength 5/5 LE bilaterally. Neuro: Distal pulses 2+. Sensation to light touch intact in LE.  Vital signs in last 24 hours:    Labs:   Estimated body mass index is 21.38 kg/m as calculated from the following:   Height as of 05/04/23: 5\' 4"  (1.626 m).   Weight as of 05/04/23: 56.5 kg.   Imaging Review Plain radiographs demonstrate severe degenerative joint disease of the left hip(s). The bone quality appears to be adequate for age and reported activity level.      Assessment/Plan:  End stage arthritis, left hip(s)  The patient history, physical examination, clinical judgement of the provider and imaging studies are consistent with end stage degenerative joint disease of the left hip(s) and total hip arthroplasty is deemed medically necessary. The treatment options including medical management, injection therapy, arthroscopy and arthroplasty were discussed at length. The risks and benefits of total hip arthroplasty were presented and reviewed. The risks due  to aseptic loosening, infection, stiffness, dislocation/subluxation,  thromboembolic complications and other imponderables were discussed.  The patient acknowledged the explanation, agreed to proceed with the plan and consent was signed. Patient is being admitted for inpatient treatment for surgery, pain control, PT, OT, prophylactic antibiotics, VTE prophylaxis, progressive ambulation and ADL's and discharge planning.The patient is planning to be discharged  home.   Rosalene Billings, PA-C Orthopedic Surgery [facility] [place] [phone]

## 2023-05-17 ENCOUNTER — Encounter (HOSPITAL_COMMUNITY)
Admission: RE | Disposition: A | Discharge status: Home / Self Care | Payer: Self-pay | Source: Ambulatory Visit | Attending: Orthopedic Surgery

## 2023-05-17 ENCOUNTER — Encounter (HOSPITAL_COMMUNITY): Payer: Self-pay | Admitting: Orthopedic Surgery

## 2023-05-17 ENCOUNTER — Ambulatory Visit (HOSPITAL_COMMUNITY): Payer: Self-pay | Admitting: Physician Assistant

## 2023-05-17 ENCOUNTER — Ambulatory Visit (HOSPITAL_BASED_OUTPATIENT_CLINIC_OR_DEPARTMENT_OTHER): Payer: Medicare Other | Admitting: Registered Nurse

## 2023-05-17 ENCOUNTER — Other Ambulatory Visit: Payer: Self-pay

## 2023-05-17 ENCOUNTER — Ambulatory Visit (HOSPITAL_COMMUNITY): Payer: Medicare Other

## 2023-05-17 ENCOUNTER — Observation Stay (HOSPITAL_COMMUNITY)
Admission: RE | Admit: 2023-05-17 | Discharge: 2023-05-18 | Disposition: A | Discharge status: Home / Self Care | Payer: Medicare Other | Source: Ambulatory Visit | Attending: Orthopedic Surgery | Admitting: Orthopedic Surgery

## 2023-05-17 ENCOUNTER — Observation Stay (HOSPITAL_COMMUNITY): Payer: Medicare Other

## 2023-05-17 DIAGNOSIS — M1612 Unilateral primary osteoarthritis, left hip: Principal | ICD-10-CM | POA: Insufficient documentation

## 2023-05-17 DIAGNOSIS — E039 Hypothyroidism, unspecified: Secondary | ICD-10-CM | POA: Insufficient documentation

## 2023-05-17 DIAGNOSIS — I4891 Unspecified atrial fibrillation: Secondary | ICD-10-CM | POA: Insufficient documentation

## 2023-05-17 DIAGNOSIS — I10 Essential (primary) hypertension: Secondary | ICD-10-CM | POA: Insufficient documentation

## 2023-05-17 DIAGNOSIS — Z96641 Presence of right artificial hip joint: Secondary | ICD-10-CM | POA: Insufficient documentation

## 2023-05-17 DIAGNOSIS — Z79899 Other long term (current) drug therapy: Secondary | ICD-10-CM | POA: Diagnosis not present

## 2023-05-17 DIAGNOSIS — Z96642 Presence of left artificial hip joint: Secondary | ICD-10-CM

## 2023-05-17 DIAGNOSIS — Z7901 Long term (current) use of anticoagulants: Secondary | ICD-10-CM | POA: Diagnosis not present

## 2023-05-17 HISTORY — PX: TOTAL HIP ARTHROPLASTY: SHX124

## 2023-05-17 SURGERY — ARTHROPLASTY, HIP, TOTAL, ANTERIOR APPROACH
Anesthesia: Spinal | Site: Hip | Laterality: Left

## 2023-05-17 MED ORDER — KETOROLAC TROMETHAMINE 30 MG/ML IJ SOLN
INTRAMUSCULAR | Status: AC
  Filled 2023-05-17: qty 1

## 2023-05-17 MED ORDER — SODIUM CHLORIDE (PF) 0.9 % IJ SOLN
INTRAMUSCULAR | Status: AC
  Filled 2023-05-17: qty 30

## 2023-05-17 MED ORDER — DEXAMETHASONE SODIUM PHOSPHATE 10 MG/ML IJ SOLN
8.0000 mg | Freq: Once | INTRAMUSCULAR | Status: AC
  Administered 2023-05-17: 8 mg via INTRAVENOUS

## 2023-05-17 MED ORDER — METOCLOPRAMIDE HCL 5 MG PO TABS: 5.0000 mg | ORAL_TABLET | Freq: Three times a day (TID) | ORAL | Status: DC | PRN

## 2023-05-17 MED ORDER — TRANEXAMIC ACID-NACL 1000-0.7 MG/100ML-% IV SOLN
1000.0000 mg | INTRAVENOUS | Status: AC
  Administered 2023-05-17: 1000 mg via INTRAVENOUS
  Filled 2023-05-17: qty 100

## 2023-05-17 MED ORDER — BISACODYL 10 MG RE SUPP: 10.0000 mg | Freq: Every day | RECTAL | Status: DC | PRN

## 2023-05-17 MED ORDER — FENTANYL CITRATE (PF) 100 MCG/2ML IJ SOLN
INTRAMUSCULAR | Status: DC | PRN
  Administered 2023-05-17 (×2): 25 ug via INTRAVENOUS

## 2023-05-17 MED ORDER — ACETAMINOPHEN 500 MG PO TABS
1000.0000 mg | ORAL_TABLET | Freq: Four times a day (QID) | ORAL | Status: DC | PRN
  Administered 2023-05-17: 1000 mg via ORAL
  Filled 2023-05-17: qty 2

## 2023-05-17 MED ORDER — RIVAROXABAN 10 MG PO TABS: 20.0000 mg | ORAL_TABLET | Freq: Every day | ORAL | Status: DC

## 2023-05-17 MED ORDER — LACTATED RINGERS IV SOLN: INTRAVENOUS | Status: DC

## 2023-05-17 MED ORDER — ONDANSETRON HCL 4 MG/2ML IJ SOLN: 4.0000 mg | Freq: Four times a day (QID) | INTRAMUSCULAR | Status: DC | PRN

## 2023-05-17 MED ORDER — POVIDONE-IODINE 10 % EX SWAB: 2.0000 | Freq: Once | CUTANEOUS | Status: AC

## 2023-05-17 MED ORDER — AMLODIPINE BESYLATE 5 MG PO TABS
5.0000 mg | ORAL_TABLET | Freq: Every day | ORAL | Status: DC
  Administered 2023-05-17 – 2023-05-18 (×2): 5 mg via ORAL
  Filled 2023-05-17 (×3): qty 1

## 2023-05-17 MED ORDER — SODIUM CHLORIDE (PF) 0.9 % IJ SOLN
INTRAMUSCULAR | Status: DC | PRN
  Administered 2023-05-17: 30 mL

## 2023-05-17 MED ORDER — PROPOFOL 1000 MG/100ML IV EMUL
INTRAVENOUS | Status: AC
  Filled 2023-05-17: qty 100

## 2023-05-17 MED ORDER — KETOROLAC TROMETHAMINE 30 MG/ML IJ SOLN
INTRAMUSCULAR | Status: DC | PRN
  Administered 2023-05-17: 30 mg

## 2023-05-17 MED ORDER — POLYETHYLENE GLYCOL 3350 17 G PO PACK
17.0000 g | PACK | Freq: Two times a day (BID) | ORAL | Status: DC
  Administered 2023-05-17 – 2023-05-18 (×2): 17 g via ORAL
  Filled 2023-05-17 (×2): qty 1

## 2023-05-17 MED ORDER — ONDANSETRON HCL 4 MG/2ML IJ SOLN
INTRAMUSCULAR | Status: AC
  Filled 2023-05-17: qty 2

## 2023-05-17 MED ORDER — CAPTOPRIL 25 MG PO TABS
25.0000 mg | ORAL_TABLET | Freq: Every day | ORAL | Status: DC
  Filled 2023-05-17: qty 1

## 2023-05-17 MED ORDER — CEFAZOLIN SODIUM-DEXTROSE 2-4 GM/100ML-% IV SOLN
2.0000 g | INTRAVENOUS | Status: AC
  Administered 2023-05-17: 2 g via INTRAVENOUS
  Filled 2023-05-17: qty 100

## 2023-05-17 MED ORDER — PROPOFOL 500 MG/50ML IV EMUL
INTRAVENOUS | Status: DC | PRN
  Administered 2023-05-17: 20 mg via INTRAVENOUS
  Administered 2023-05-17: 40 ug/kg/min via INTRAVENOUS

## 2023-05-17 MED ORDER — DOCUSATE SODIUM 100 MG PO CAPS
100.0000 mg | ORAL_CAPSULE | Freq: Two times a day (BID) | ORAL | Status: DC
  Administered 2023-05-17 – 2023-05-18 (×2): 100 mg via ORAL
  Filled 2023-05-17 (×2): qty 1

## 2023-05-17 MED ORDER — METHOCARBAMOL 500 MG IVPB - SIMPLE MED: 500.0000 mg | Freq: Four times a day (QID) | INTRAVENOUS | Status: DC | PRN

## 2023-05-17 MED ORDER — METOCLOPRAMIDE HCL 5 MG/ML IJ SOLN: 5.0000 mg | Freq: Three times a day (TID) | INTRAMUSCULAR | Status: DC | PRN

## 2023-05-17 MED ORDER — ORAL CARE MOUTH RINSE: 15.0000 mL | Freq: Once | OROMUCOSAL | Status: AC

## 2023-05-17 MED ORDER — DIPHENHYDRAMINE HCL 12.5 MG/5ML PO ELIX: 12.5000 mg | ORAL_SOLUTION | ORAL | Status: DC | PRN

## 2023-05-17 MED ORDER — PHENOL 1.4 % MT LIQD: 1.0000 | OROMUCOSAL | Status: DC | PRN

## 2023-05-17 MED ORDER — FENTANYL CITRATE (PF) 100 MCG/2ML IJ SOLN
INTRAMUSCULAR | Status: AC
  Filled 2023-05-17: qty 2

## 2023-05-17 MED ORDER — PHENYLEPHRINE HCL-NACL 20-0.9 MG/250ML-% IV SOLN
INTRAVENOUS | Status: DC | PRN
  Administered 2023-05-17: 20 ug/min via INTRAVENOUS

## 2023-05-17 MED ORDER — BUPIVACAINE-EPINEPHRINE (PF) 0.25% -1:200000 IJ SOLN
INTRAMUSCULAR | Status: DC | PRN
  Administered 2023-05-17: 30 mL

## 2023-05-17 MED ORDER — DEXAMETHASONE SODIUM PHOSPHATE 10 MG/ML IJ SOLN
INTRAMUSCULAR | Status: AC
  Filled 2023-05-17: qty 1

## 2023-05-17 MED ORDER — GLYCOPYRROLATE 0.2 MG/ML IJ SOLN
INTRAMUSCULAR | Status: DC | PRN
  Administered 2023-05-17 (×2): .1 mg via INTRAVENOUS

## 2023-05-17 MED ORDER — LEVOTHYROXINE SODIUM 50 MCG PO TABS
50.0000 ug | ORAL_TABLET | Freq: Every day | ORAL | Status: DC
  Administered 2023-05-18: 50 ug via ORAL
  Filled 2023-05-17: qty 1

## 2023-05-17 MED ORDER — HYDROCODONE-ACETAMINOPHEN 5-325 MG PO TABS
1.0000 | ORAL_TABLET | ORAL | Status: DC | PRN
  Administered 2023-05-17: 1 via ORAL

## 2023-05-17 MED ORDER — MENTHOL 3 MG MT LOZG: 1.0000 | LOZENGE | OROMUCOSAL | Status: DC | PRN

## 2023-05-17 MED ORDER — HYDROCODONE-ACETAMINOPHEN 5-325 MG PO TABS
ORAL_TABLET | ORAL | Status: AC
  Filled 2023-05-17: qty 1

## 2023-05-17 MED ORDER — TRAMADOL HCL 50 MG PO TABS: 50.0000 mg | ORAL_TABLET | Freq: Four times a day (QID) | ORAL | Status: DC | PRN

## 2023-05-17 MED ORDER — CEFAZOLIN SODIUM-DEXTROSE 2-4 GM/100ML-% IV SOLN
2.0000 g | Freq: Four times a day (QID) | INTRAVENOUS | Status: AC
  Administered 2023-05-17 (×2): 2 g via INTRAVENOUS
  Filled 2023-05-17 (×2): qty 100

## 2023-05-17 MED ORDER — ONDANSETRON HCL 4 MG/2ML IJ SOLN
INTRAMUSCULAR | Status: DC | PRN
  Administered 2023-05-17: 4 mg via INTRAVENOUS

## 2023-05-17 MED ORDER — DEXAMETHASONE SODIUM PHOSPHATE 10 MG/ML IJ SOLN
10.0000 mg | Freq: Once | INTRAMUSCULAR | Status: AC
  Administered 2023-05-18: 10 mg via INTRAVENOUS
  Filled 2023-05-17: qty 1

## 2023-05-17 MED ORDER — ONDANSETRON HCL 4 MG PO TABS: 4.0000 mg | ORAL_TABLET | Freq: Four times a day (QID) | ORAL | Status: DC | PRN

## 2023-05-17 MED ORDER — STERILE WATER FOR IRRIGATION IR SOLN
Status: DC | PRN
  Administered 2023-05-17: 2000 mL

## 2023-05-17 MED ORDER — 0.9 % SODIUM CHLORIDE (POUR BTL) OPTIME
TOPICAL | Status: DC | PRN
  Administered 2023-05-17: 1000 mL

## 2023-05-17 MED ORDER — METHOCARBAMOL 500 MG PO TABS
ORAL_TABLET | ORAL | Status: AC
  Filled 2023-05-17: qty 1

## 2023-05-17 MED ORDER — METHOCARBAMOL 500 MG PO TABS
500.0000 mg | ORAL_TABLET | Freq: Four times a day (QID) | ORAL | Status: DC | PRN
  Administered 2023-05-17 (×2): 500 mg via ORAL
  Filled 2023-05-17: qty 1

## 2023-05-17 MED ORDER — SODIUM CHLORIDE 0.9 % IV SOLN: INTRAVENOUS | Status: DC

## 2023-05-17 MED ORDER — BUPIVACAINE-EPINEPHRINE 0.25% -1:200000 IJ SOLN
INTRAMUSCULAR | Status: AC
  Filled 2023-05-17: qty 1

## 2023-05-17 MED ORDER — TRANEXAMIC ACID-NACL 1000-0.7 MG/100ML-% IV SOLN
1000.0000 mg | Freq: Once | INTRAVENOUS | Status: AC
  Administered 2023-05-17: 1000 mg via INTRAVENOUS
  Filled 2023-05-17: qty 100

## 2023-05-17 MED ORDER — BUPIVACAINE IN DEXTROSE 0.75-8.25 % IT SOLN
INTRATHECAL | Status: DC | PRN
  Administered 2023-05-17: 1.6 mL via INTRATHECAL

## 2023-05-17 MED ORDER — CHLORHEXIDINE GLUCONATE 0.12 % MT SOLN
15.0000 mL | Freq: Once | OROMUCOSAL | Status: AC
  Administered 2023-05-17: 15 mL via OROMUCOSAL

## 2023-05-17 MED ORDER — MORPHINE SULFATE (PF) 2 MG/ML IV SOLN: 0.5000 mg | INTRAVENOUS | Status: DC | PRN

## 2023-05-17 MED ORDER — ATENOLOL 50 MG PO TABS
50.0000 mg | ORAL_TABLET | Freq: Every day | ORAL | Status: DC
  Administered 2023-05-18: 50 mg via ORAL
  Filled 2023-05-17: qty 1

## 2023-05-17 MED ORDER — FENTANYL CITRATE PF 50 MCG/ML IJ SOSY: 25.0000 ug | PREFILLED_SYRINGE | INTRAMUSCULAR | Status: DC | PRN

## 2023-05-17 MED ORDER — ROSUVASTATIN CALCIUM 5 MG PO TABS
5.0000 mg | ORAL_TABLET | Freq: Every day | ORAL | Status: DC
  Administered 2023-05-17: 5 mg via ORAL
  Filled 2023-05-17 (×2): qty 1

## 2023-05-17 MED ORDER — ACETAMINOPHEN 500 MG PO TABS
1000.0000 mg | ORAL_TABLET | Freq: Once | ORAL | Status: AC
  Administered 2023-05-17: 1000 mg via ORAL
  Filled 2023-05-17: qty 2

## 2023-05-17 SURGICAL SUPPLY — 43 items
ADH SKN CLS APL DERMABOND .7 (GAUZE/BANDAGES/DRESSINGS) ×1
ARTICULEZE HEAD (Hips) ×1 IMPLANT
BAG COUNTER SPONGE SURGICOUNT (BAG) IMPLANT
BAG SPEC THK2 15X12 ZIP CLS (MISCELLANEOUS)
BAG SPNG CNTER NS LX DISP (BAG)
BAG ZIPLOCK 12X15 (MISCELLANEOUS) IMPLANT
BLADE SAG 18X100X1.27 (BLADE) ×1 IMPLANT
COVER PERINEAL POST (MISCELLANEOUS) ×1 IMPLANT
COVER SURGICAL LIGHT HANDLE (MISCELLANEOUS) ×1 IMPLANT
CUP ACETBLR 52 OD PINNACLE (Hips) IMPLANT
DERMABOND ADVANCED .7 DNX12 (GAUZE/BANDAGES/DRESSINGS) ×1 IMPLANT
DRAPE FOOT SWITCH (DRAPES) ×1 IMPLANT
DRAPE STERI IOBAN 125X83 (DRAPES) ×1 IMPLANT
DRAPE U-SHAPE 47X51 STRL (DRAPES) ×2 IMPLANT
DRESSING AQUACEL AG SP 3.5X10 (GAUZE/BANDAGES/DRESSINGS) ×1 IMPLANT
DRSG AQUACEL AG ADV 3.5X10 (GAUZE/BANDAGES/DRESSINGS) IMPLANT
DRSG AQUACEL AG SP 3.5X10 (GAUZE/BANDAGES/DRESSINGS) ×1
DURAPREP 26ML APPLICATOR (WOUND CARE) ×1 IMPLANT
ELECT REM PT RETURN 15FT ADLT (MISCELLANEOUS) ×1 IMPLANT
GLOVE BIO SURGEON STRL SZ 6 (GLOVE) ×1 IMPLANT
GLOVE BIOGEL PI IND STRL 6.5 (GLOVE) ×1 IMPLANT
GLOVE BIOGEL PI IND STRL 7.5 (GLOVE) ×1 IMPLANT
GLOVE ORTHO TXT STRL SZ7.5 (GLOVE) ×2 IMPLANT
GOWN STRL REUS W/ TWL LRG LVL3 (GOWN DISPOSABLE) ×2 IMPLANT
GOWN STRL REUS W/TWL LRG LVL3 (GOWN DISPOSABLE) ×2
HEAD ARTICULEZE (Hips) IMPLANT
HOLDER FOLEY CATH W/STRAP (MISCELLANEOUS) ×1 IMPLANT
KIT TURNOVER KIT A (KITS) IMPLANT
LINER NEUTRAL 52X36MM PLUS 4 (Liner) IMPLANT
NDL SAFETY ECLIP 18X1.5 (MISCELLANEOUS) IMPLANT
PACK ANTERIOR HIP CUSTOM (KITS) ×1 IMPLANT
SCREW 6.5MMX30MM (Screw) IMPLANT
STEM FEMORAL SZ6 HIGH ACTIS (Stem) IMPLANT
SUT MNCRL AB 4-0 PS2 18 (SUTURE) ×1 IMPLANT
SUT STRATAFIX 0 PDS 27 VIOLET (SUTURE) ×1
SUT VIC AB 1 CT1 36 (SUTURE) ×3 IMPLANT
SUT VIC AB 2-0 CT1 27 (SUTURE) ×2
SUT VIC AB 2-0 CT1 TAPERPNT 27 (SUTURE) ×2 IMPLANT
SUTURE STRATFX 0 PDS 27 VIOLET (SUTURE) ×1 IMPLANT
SYR 3ML LL SCALE MARK (SYRINGE) IMPLANT
TRAY FOLEY W/BAG SLVR 14FR LF (SET/KITS/TRAYS/PACK) IMPLANT
TUBE SUCTION HIGH CAP CLEAR NV (SUCTIONS) ×1 IMPLANT
WATER STERILE IRR 1000ML POUR (IV SOLUTION) ×1 IMPLANT

## 2023-05-17 NOTE — Discharge Instructions (Signed)

## 2023-05-17 NOTE — Evaluation (Signed)
Physical Therapy Evaluation Patient Details Name: April Duke MRN: [id number] DOB: Jul 28, 1946 Today's Date: 05/17/2023  History of Present Illness  Pt is a 77 yo female admitted for L anterior THA on 05/17/23.  Pt with PMH including but not limited to R THA 1/24, afib, HLD, HTN, hypothyroidism, osteoporosis, PNA, GERD  Clinical Impression  Pt is s/p THA resulting in the deficits listed below (see PT Problem List). At baseline, pt resides with family and is able to ambulate but having to use RW or cane lately due to hip pain.  Today, pt with good pain control.  Initially reports sensation intact but when stood reports still feels numb/decreased.  Pt still demonstrating good strength and steady on her feet but did limit ambulation to in room with min guard of 2 for safety.   Pt will benefit from acute skilled PT to increase their independence and safety with mobility to facilitate discharge. Pt expected to progress very well and is familiar with the rehab/recovery process from her prior R TKA.          Assistance Recommended at Discharge Intermittent Supervision/Assistance  If plan is discharge home, recommend the following:  Can travel by private vehicle  A little help with walking and/or transfers;A little help with bathing/dressing/bathroom;Assistance with cooking/housework;Help with stairs or ramp for entrance        Equipment Recommendations Rolling walker (2 wheels) (reports hers is damaged)  Recommendations for Other Services       Functional Status Assessment Patient has had a recent decline in their functional status and demonstrates the ability to make significant improvements in function in a reasonable and predictable amount of time.     Precautions / Restrictions Precautions Precautions: Fall Restrictions Weight Bearing Restrictions: Yes LLE Weight Bearing: Weight bearing as tolerated      Mobility  Bed Mobility Overal bed mobility: Needs Assistance Bed Mobility:  Supine to Sit     Supine to sit: Supervision, HOB elevated          Transfers Overall transfer level: Needs assistance Equipment used: Rolling walker (2 wheels) Transfers: Sit to/from Stand Sit to Stand: Min guard           General transfer comment: min guard for safety with cues for hand placement    Ambulation/Gait Ambulation/Gait assistance: Min guard, +2 safety/equipment Gait Distance (Feet): 20 Feet Assistive device: Rolling walker (2 wheels) Gait Pattern/deviations: Step-to pattern, Decreased stride length Gait velocity: decreased     General Gait Details: Upon standing did report bil buttock felt numb and feet sensation decreased.  Still demonstrating good stability and strength but had assist of 2 for safety and limited to in room ambulation for safety. Tolerated well with RW.  Stairs            Wheelchair Mobility     Tilt Bed    Modified Rankin (Stroke Patients Only)       Balance Overall balance assessment: Needs assistance Sitting-balance support: No upper extremity supported Sitting balance-Leahy Scale: Good     Standing balance support: Bilateral upper extremity supported, Reliant on assistive device for balance Standing balance-Leahy Scale: Poor Standing balance comment: steady with RW                             Pertinent Vitals/Pain Pain Assessment Pain Assessment: 0-10 Pain Score: 1  Pain Location: L hip Pain Descriptors / Indicators: Sore Pain Intervention(s): Limited activity within patient's tolerance, Monitored during  session, Premedicated before session, Ice applied    Home Living Family/patient expects to be discharged to:: Private residence Living Arrangements: Spouse/significant other Available Help at Discharge: Family;Available 24 hours/day Type of Home: House Home Access: Stairs to enter;Ramped entrance Entrance Stairs-Rails: Right;Left;Can reach both Entrance Stairs-Number of Steps: 3   Home Layout:  One level Home Equipment: Tub bench;BSC/3in1;Cane - single point Additional Comments: Reports her RW is damaged    Prior Function Prior Level of Function : Needs assist             Mobility Comments: SPC at home, RW for community mobility (limited community lately due to hip pain) ADLs Comments: Husband helps with dressing especially socks, shoes, and pants; uses tub bench for bath; shared in IADLs     Hand Dominance        Extremity/Trunk Assessment   Upper Extremity Assessment Upper Extremity Assessment: Overall WFL for tasks assessed    Lower Extremity Assessment Lower Extremity Assessment: RLE deficits/detail;LLE deficits/detail RLE Deficits / Details: ROM WFL; MMT: 5/5 LLE Deficits / Details: Expected post op changes; ROM WFL; MMT 5/5 ankles and 3/5 hip and knee not further tested    Cervical / Trunk Assessment Cervical / Trunk Assessment: Normal  Communication   Communication: No difficulties  Cognition Arousal/Alertness: Awake/alert Behavior During Therapy: WFL for tasks assessed/performed Overall Cognitive Status: Within Functional Limits for tasks assessed                                          General Comments General comments (skin integrity, edema, etc.): VSS    Exercises Total Joint Exercises Ankle Circles/Pumps: AROM, Both, 10 reps, Seated   Assessment/Plan    PT Assessment Patient needs continued PT services  PT Problem List Decreased strength;Decreased mobility;Decreased range of motion;Decreased activity tolerance;Decreased balance;Decreased knowledge of use of DME;Pain;Decreased knowledge of precautions       PT Treatment Interventions DME instruction;Therapeutic activities;Modalities;Gait training;Therapeutic exercise;Patient/family education;Stair training;Balance training;Functional mobility training;Neuromuscular re-education    PT Goals (Current goals can be found in the Care Plan section)  Acute Rehab PT  Goals Patient Stated Goal: return home; get back to walking outside in the mornings PT Goal Formulation: With patient/family Time For Goal Achievement: 05/31/23 Potential to Achieve Goals: Good    Frequency 7X/week     Co-evaluation               AM-PAC PT "6 Clicks" Mobility  Outcome Measure Help needed turning from your back to your side while in a flat bed without using bedrails?: A Little Help needed moving from lying on your back to sitting on the side of a flat bed without using bedrails?: A Little Help needed moving to and from a bed to a chair (including a wheelchair)?: A Little Help needed standing up from a chair using your arms (e.g., wheelchair or bedside chair)?: A Little Help needed to walk in hospital room?: A Little Help needed climbing 3-5 steps with a railing? : A Lot 6 Click Score: 17    End of Session Equipment Utilized During Treatment: Gait belt Activity Tolerance: Patient tolerated treatment well Patient left: in chair;with call bell/phone within reach;with chair alarm set Nurse Communication: Mobility status PT Visit Diagnosis: Other abnormalities of gait and mobility (R26.89);Muscle weakness (generalized) (M62.81)    Time: 1610-9604 PT Time Calculation (min) (ACUTE ONLY): 21 min   Charges:   PT  Evaluation $PT Eval Low Complexity: 1 Low   PT General Charges $$ ACUTE PT VISIT: 1 Visit         Anise Salvo, PT Acute Rehab [facility] [phone]   Rayetta Humphrey 05/17/2023, 3:25 PM

## 2023-05-17 NOTE — Anesthesia Procedure Notes (Addendum)
Spinal  Patient location during procedure: OR Start time: 05/17/2023 10:05 AM End time: 05/17/2023 10:19 AM Reason for block: surgical anesthesia Staffing Performed: anesthesiologist  Anesthesiologist: Gaynelle Adu, MD Performed by: Gaynelle Adu, MD Authorized by: Gaynelle Adu, MD   Preanesthetic Checklist Completed: patient identified, IV checked, risks and benefits discussed, surgical consent, monitors and equipment checked, pre-op evaluation and timeout performed Spinal Block Patient position: sitting Prep: DuraPrep Patient monitoring: cardiac monitor, continuous pulse ox and blood pressure Approach: right paramedian Location: L3-4 Injection technique: single-shot Needle Needle type: Quincke (Attempted Pencan 24G. Unable to locate the space.)  Needle gauge: 22 G Needle length: 9 cm Assessment Sensory level: T8 Events: CSF return and second provider Additional Notes Functioning IV was confirmed and monitors were applied. Sterile prep and drape, including hand hygiene and sterile gloves were used. The patient was positioned and the spine was prepped. The skin was anesthetized with lidocaine.  Free flow of clear CSF was obtained prior to injecting local anesthetic into the CSF.  The spinal needle aspirated freely following injection.  The needle was carefully withdrawn.  The patient tolerated the procedure well.

## 2023-05-17 NOTE — Interval H&P Note (Signed)
History and Physical Interval Note:  05/17/2023 8:41 AM  April Duke  has presented today for surgery, with the diagnosis of Left hip osteoarthritis.  The various methods of treatment have been discussed with the patient and family. After consideration of risks, benefits and other options for treatment, the patient has consented to  Procedure(s): TOTAL HIP ARTHROPLASTY ANTERIOR APPROACH (Left) as a surgical intervention.  The patient's history has been reviewed, patient examined, no change in status, stable for surgery.  I have reviewed the patient's chart and labs.  Questions were answered to the patient's satisfaction.     Shelda Pal

## 2023-05-17 NOTE — Care Plan (Signed)
Ortho Bundle Case Management Note  Patient Details  Name: April Duke MRN: [id number] Date of Birth: 10/01/1946                  L THA on 05-17-23  DCP: Home with husband and son  DME: No needs, has a RW  PT: HEP   DME Arranged:  N/A DME Agency:      Additional Comments: Please contact me with any questions of if this plan should need to change.   Ennis Forts, RN,CCM [facility]  [phone] 05/17/2023, 12:09 PM

## 2023-05-17 NOTE — Op Note (Signed)
NAME:  April Duke                ACCOUNT NO.: [id number]      MEDICAL RECORD NO.: [id number]      FACILITY:  [facility]      PHYSICIAN:  Shelda Pal  DATE OF BIRTH:  1946/09/24     DATE OF PROCEDURE:  05/17/2023                                 OPERATIVE REPORT         PREOPERATIVE DIAGNOSIS: Left  hip osteoarthritis.      POSTOPERATIVE DIAGNOSIS:  Left hip osteoarthritis.      PROCEDURE:  Left total hip replacement through an anterior approach   utilizing DePuy THR system, component size 52 mm pinnacle cup, a size 36+4 neutral   Altrex liner, a size 6 Hi Actis stem with a 36+5 Articuleze metal head ball.      SURGEON:  Madlyn Frankel. Charlann Boxer, M.D.      ASSISTANT:  Rosalene Billings, PA-C     ANESTHESIA:  Spinal.      SPECIMENS:  None.      COMPLICATIONS:  None.      BLOOD LOSS:  150 cc     DRAINS:  None.      INDICATION OF THE PROCEDURE:  April Duke is a 77 y.o. female who had   presented to office for evaluation of left hip pain.  Radiographs revealed   progressive degenerative changes with bone-on-bone   articulation of the  hip joint, including subchondral cystic changes and osteophytes.  The patient had painful limited range of   motion significantly affecting their overall quality of life and function.  The patient was failing to    respond to conservative measures including medications and/or injections and activity modification and at this point was ready   to proceed with more definitive measures.  Consent was obtained for   benefit of pain relief.  Specific risks of infection, DVT, component   failure, dislocation, neurovascular injury, and need for revision surgery were reviewed in the office.     PROCEDURE IN DETAIL:  The patient was brought to operative theater.   Once adequate anesthesia, preoperative antibiotics, 2 gm of Ancef, 1 gm of Tranexamic Acid, and 10 mg of Decadron were administered, the patient was positioned supine on the [employer]  table.  Once the patient was safely positioned with adequate padding of boney prominences we predraped out the hip, and used fluoroscopy to confirm orientation of the pelvis.      The left hip was then prepped and draped from proximal iliac crest to   mid thigh with a shower curtain technique.      Time-out was performed identifying the patient, planned procedure, and the appropriate extremity.     An incision was then made 2 cm lateral to the   anterior superior iliac spine extending over the orientation of the   tensor fascia lata muscle and sharp dissection was carried down to the   fascia of the muscle.      The fascia was then incised.  The muscle belly was identified and swept   laterally and retractor placed along the superior neck.  Following   cauterization of the circumflex vessels and removing some pericapsular   fat, a second cobra retractor was placed on the inferior neck.  A  T-capsulotomy was made along the line of the   superior neck to the trochanteric fossa, then extended proximally and   distally.  Tag sutures were placed and the retractors were then placed   intracapsular.  We then identified the trochanteric fossa and   orientation of my neck cut and then made a neck osteotomy with the femur on traction.  The femoral   head was removed without difficulty or complication.  Traction was let   off and retractors were placed posterior and anterior around the   acetabulum.      The labrum and foveal tissue were debrided.  I began reaming with a 48 mm   reamer and reamed up to 51 mm reamer with good bony bed preparation and a 52 mm  cup was chosen.  The final 52 mm Pinnacle cup was then impacted under fluoroscopy to confirm the depth of penetration and orientation with respect to   Abduction and forward flexion.  A screw was placed into the ilium followed by the hole eliminator.  The final   36+4 neutral Altrex liner was impacted with good visualized rim fit.  The cup was  positioned anatomically within the acetabular portion of the pelvis.      At this point, the femur was rolled to 100 degrees.  Further capsule was   released off the inferior aspect of the femoral neck.  I then   released the superior capsule proximally.  With the leg in a neutral position the hook was placed laterally   along the femur under the vastus lateralis origin and elevated manually and then held in position using the hook attachment on the bed.  The leg was then extended and adducted with the leg rolled to 100   degrees of external rotation.  Retractors were placed along the medial calcar and posteriorly over the greater trochanter.  Once the proximal femur was fully   exposed, I used a box osteotome to set orientation.  I then began   broaching with the starting chili pepper broach and passed this by hand and then broached up to 6.  With the 6 broach in place I chose a high offset neck and did several trial reductions.  The offset was appropriate, leg lengths   appeared to be equal best matched with the +5 head ball trial confirmed radiographically.   Given these findings, I went ahead and dislocated the hip, repositioned all   retractors and positioned the right hip in the extended and abducted position.  The final 6 Hi Actis stem was   chosen and it was impacted down to the level of neck cut.  Based on this   and the trial reductions, a final 36+5 Articuleze metal head ball was chosen and   impacted onto a clean and dry trunnion, and the hip was reduced.  The   hip had been irrigated throughout the case again at this point.  I did   reapproximate the superior capsular leaflet to the anterior leaflet   using #1 Vicryl.  The fascia of the   tensor fascia lata muscle was then reapproximated using #1 Vicryl and #0 Stratafix sutures.  The   remaining wound was closed with 2-0 Vicryl and running 4-0 Monocryl.   The hip was cleaned, dried, and dressed sterilely using Dermabond and    Aquacel dressing.  The patient was then brought   to recovery room in stable condition tolerating the procedure well.    Rosalene Billings, PA-C  was present for the entirety of the case involved from   preoperative positioning, perioperative retractor management, general   facilitation of the case, as well as primary wound closure as assistant.            Madlyn Frankel Charlann Boxer, M.D.        05/17/2023 8:42 AM

## 2023-05-17 NOTE — Transfer of Care (Signed)
Immediate Anesthesia Transfer of Care Note  Patient: Mkenzie Dotts  Procedure(s) Performed: TOTAL HIP ARTHROPLASTY ANTERIOR APPROACH (Left: Hip)  Patient Location: PACU  Anesthesia Type:MAC and Spinal  Level of Consciousness: awake, alert , oriented, and patient cooperative  Airway & Oxygen Therapy: Patient Spontanous Breathing and Patient connected to face mask oxygen  Post-op Assessment: Report given to RN and Post -op Vital signs reviewed and stable  Post vital signs: Reviewed and stable  Last Vitals:  Vitals Value Taken Time  BP 102/51 05/17/23 1140  Temp    Pulse 56 05/17/23 1143  Resp 14 05/17/23 1143  SpO2 100 % 05/17/23 1143  Vitals shown include unfiled device data.  Last Pain:  Vitals:   05/17/23 0800  TempSrc: Oral  PainSc:          Complications: No notable events documented.

## 2023-05-17 NOTE — OR Nursing (Signed)
Allergies reviewed and discussed with Surgeon. Will administer toradol

## 2023-05-17 NOTE — Anesthesia Postprocedure Evaluation (Signed)
Anesthesia Post Note  Patient: Mercedees Convery  Procedure(s) Performed: TOTAL HIP ARTHROPLASTY ANTERIOR APPROACH (Left: Hip)     Patient location during evaluation: PACU Anesthesia Type: Spinal Level of consciousness: oriented and awake and alert Pain management: pain level controlled Vital Signs Assessment: post-procedure vital signs reviewed and stable Respiratory status: spontaneous breathing and respiratory function stable Cardiovascular status: blood pressure returned to baseline and stable Postop Assessment: no headache, no backache, no apparent nausea or vomiting, spinal receding and patient able to bend at knees Anesthetic complications: no  No notable events documented.  Last Vitals:  Vitals:   05/17/23 1230 05/17/23 1245  BP: (!) 137/101 (!) 152/67  Pulse: (!) 52 (!) 44  Resp: 18 (!) 23  Temp: (!) 36.4 C   SpO2: 97% 100%    Last Pain:  Vitals:   05/17/23 1230  TempSrc:   PainSc: 0-No pain                 Florabel Faulks,W. EDMOND

## 2023-05-18 ENCOUNTER — Encounter (HOSPITAL_COMMUNITY): Payer: Self-pay | Admitting: Orthopedic Surgery

## 2023-05-18 DIAGNOSIS — M1612 Unilateral primary osteoarthritis, left hip: Secondary | ICD-10-CM | POA: Diagnosis not present

## 2023-05-18 LAB — BASIC METABOLIC PANEL
Anion gap: 7 (ref 5–15)
BUN: 9 mg/dL (ref 8–23)
CO2: 22 mmol/L (ref 22–32)
Calcium: 8.9 mg/dL (ref 8.9–10.3)
Chloride: 100 mmol/L (ref 98–111)
Creatinine, Ser: 0.68 mg/dL (ref 0.44–1.00)
GFR, Estimated: >60 mL/min (ref >60)
Glucose, Bld: 189 mg/dL — ABNORMAL HIGH (ref 70–99)
Potassium: 3.7 mmol/L (ref 3.5–5.1)
Sodium: 129 mmol/L — ABNORMAL LOW (ref 135–145)

## 2023-05-18 LAB — CBC
HCT: 29.7 % — ABNORMAL LOW (ref 36.0–46.0)
Hemoglobin: 9.9 g/dL — ABNORMAL LOW (ref 12.0–15.0)
MCH: 28.9 pg (ref 26.0–34.0)
MCHC: 33.3 g/dL (ref 30.0–36.0)
MCV: 86.6 fL (ref 80.0–100.0)
Platelets: 164 10*3/uL (ref 150–400)
RBC: 3.43 MIL/uL — ABNORMAL LOW (ref 3.87–5.11)
RDW: 14.7 % (ref 11.5–15.5)
WBC: 12.6 10*3/uL — ABNORMAL HIGH (ref 4.0–10.5)
nRBC: 0 % (ref 0.0–0.2)

## 2023-05-18 MED ORDER — TRAMADOL HCL 50 MG PO TABS: 50.0000 mg | ORAL_TABLET | Freq: Four times a day (QID) | ORAL | 0 refills | Status: DC | PRN

## 2023-05-18 MED ORDER — POLYETHYLENE GLYCOL 3350 17 G PO PACK: 17.0000 g | PACK | Freq: Two times a day (BID) | ORAL | 0 refills | Status: DC

## 2023-05-18 MED ORDER — NITROFURANTOIN MONOHYD MACRO 100 MG PO CAPS: 100.0000 mg | ORAL_CAPSULE | Freq: Two times a day (BID) | ORAL | 0 refills | Status: AC

## 2023-05-18 MED ORDER — METHOCARBAMOL 500 MG PO TABS: 500.0000 mg | ORAL_TABLET | Freq: Four times a day (QID) | ORAL | 1 refills | Status: DC | PRN

## 2023-05-18 NOTE — Progress Notes (Signed)
Pt called out to nurses station while eating breakfast and reported she was choking. RN went into pts room and pt was sitting in bed in no distress and was able to talk to RN. Pt explains "something tried to choke me". RN asked her if she was eating breakfast when she got choked and if she thought she may have choked on the sausage. Pt explained she was eating while getting choked and it probably was the sausage. Pt reports it does not feel like the sausage was still in throat. Pt requests RN to go to cafeteria to get her sons. Pts sons were gone to cafeteria to get breakfast. RN explained pts sons will be back soon from getting breakfast and asked if pt would like to keep door open. Pt was agreeable to leaving door open and waiting for sons to return. Pt was in no distress. When RN went to give pt morning meds, pt began to cough while swallowing pill. Pt was able to clear throat and swallow pill. Pt wanted to try to swallow another pill and coughed on pill again but pill went down. Pt began coughing and coughed up moderate amt of clear phlegm. Pt and sons think pts throat may be dry or irritated. RN made provider aware of episode. Provider suspects issues are from dryness or reflux. No new orders given.

## 2023-05-18 NOTE — Plan of Care (Signed)
  Problem: Coping: Goal: Level of anxiety will decrease Outcome: Progressing   Problem: Pain Managment: Goal: General experience of comfort will improve Outcome: Progressing   

## 2023-05-18 NOTE — Care Management Obs Status (Signed)
MEDICARE OBSERVATION STATUS NOTIFICATION   Patient Details  Name: April Duke MRN: [id number] Date of Birth: Oct 28, 1945   Medicare Observation Status Notification Given:  Yes    Ewing Schlein, LCSW 05/18/2023, 11:51 AM

## 2023-05-18 NOTE — Progress Notes (Signed)
Patient ID: April Duke, female   DOB: 21-Feb-1946, 77 y.o.   MRN: [id number] Subjective: 1 Day Post-Op Procedure(s) (LRB): TOTAL HIP ARTHROPLASTY ANTERIOR APPROACH (Left)    Patient reports pain as mild.  No events noted over night.  Family in room.  Objective:   VITALS:   Vitals:   05/18/23 0200 05/18/23 0624  BP: (!) 128/53 (!) 131/59  Pulse: (!) 59   Resp: 17 16  Temp: 98 F (36.7 C) 97.7 F (36.5 C)  SpO2: 100% 96%    Neurovascular intact Incision: dressing C/D/I - left hip   LABS Recent Labs    05/18/23 0356  HGB 9.9*  HCT 29.7*  WBC 12.6*  PLT 164    Recent Labs    05/18/23 0356  NA 129*  K 3.7  BUN 9  CREATININE 0.68  GLUCOSE 189*    No results for input(s): "LABPT", "INR" in the last 72 hours.   Assessment/Plan: 1 Day Post-Op Procedure(s) (LRB): TOTAL HIP ARTHROPLASTY ANTERIOR APPROACH (Left)   Advance diet Up with therapy Home today if she does well with PT RTC in 2 weeks

## 2023-05-18 NOTE — Progress Notes (Signed)
Patient and 2 sons agree pt is ready to be discharged and agree pts throat is probably dry. Pt reports having sinus drainage for several weeks.

## 2023-05-18 NOTE — TOC Transition Note (Signed)
Transition of Care [facility]) - CM/SW Discharge Note  Patient Details  Name: April Duke MRN: [id number] Date of Birth: 10/30/1945  [facility] of [facility]) CM/SW Contact:  Ewing Schlein, LCSW Phone Number: 05/18/2023, 10:31 AM  Clinical Narrative: Patient is expected to discharge home after working with PT. CSW met with patient and family to confirm discharge plan. Patient will go home with a home exercise program (HEP). Patient reported she will need a replacement rolling walker (one was received in January 2024), but patient declined a replacement as it would be private pay as it has been less than 5 years since her insurance was billed for one. TOC signing off.   Final next level of care: Home/Self Care Barriers to Discharge: No Barriers Identified  Patient Goals and CMS Choice Choice offered to / list presented to : NA  Discharge Plan and Services Additional resources added to the After Visit Summary for      DME Arranged: N/A DME Agency: NA  Social Determinants of Health (SDOH) Interventions SDOH Screenings   Food Insecurity: No Food Insecurity (05/17/2023)  Housing: Low Risk  (05/17/2023)  Transportation Needs: No Transportation Needs (05/17/2023)  Utilities: Not At Risk (05/17/2023)  Alcohol Screen: Low Risk  (02/02/2022)  Depression (PHQ2-9): Low Risk  (01/10/2023)  Financial Resource Strain: Low Risk  (02/02/2022)  Physical Activity: Insufficiently Active (02/02/2022)  Social Connections: Unknown (03/08/2022)   Received from [facility]  Stress: No Stress Concern Present (02/02/2022)  Tobacco Use: Low Risk  (05/17/2023)   Readmission Risk Interventions     No data to display

## 2023-05-18 NOTE — Progress Notes (Signed)
Physical Therapy Treatment Patient Details Name: April Duke MRN: [id number] DOB: [birth year] Today's Date: 05/18/2023   History of Present Illness Pt is a 77 yo female admitted for L anterior THA on 05/17/23.  Pt with PMH including but not limited to R THA 1/24, afib, HLD, HTN, hypothyroidism, osteoporosis, PNA, GERD    PT Comments  Progressing with mobility. Reviewed/practiced exercises, gait training, and stair training. Sons present during session to observe. Issued HEP for pt to follow/perform 2x/day. Encouraged pt to ambulate often, as tolerated. All PT education completed.      Assistance Recommended at Discharge Intermittent Supervision/Assistance  If plan is discharge home, recommend the following:  Can travel by private vehicle    A little help with walking and/or transfers;A little help with bathing/dressing/bathroom;Assistance with cooking/housework;Help with stairs or ramp for entrance      Equipment Recommendations  Rolling walker (2 wheels)    Recommendations for Other Services       Precautions / Restrictions Precautions Precautions: Fall Restrictions Weight Bearing Restrictions: No LLE Weight Bearing: Weight bearing as tolerated     Mobility  Bed Mobility               General bed mobility comments: oob in recliner    Transfers Overall transfer level: Needs assistance Equipment used: Rolling walker (2 wheels) Transfers: Sit to/from Stand Sit to Stand: Min guard           General transfer comment: min guard for safety with cues for hand placement    Ambulation/Gait Ambulation/Gait assistance: Min guard Gait Distance (Feet): 75 Feet Assistive device: Rolling walker (2 wheels) Gait Pattern/deviations: Step-through pattern, Decreased stride length       General Gait Details: Min guard for safety. Slow gait speed. Pt denied dizziness. No LOB with RW use.   Stairs Stairs: Yes Stairs assistance: Min guard Stair Management: Forwards, Two  rails, Step to pattern Number of Stairs: 2 General stair comments: up and over portable stairs x 1. cues for safety, technique, sequence.   Wheelchair Mobility     Tilt Bed    Modified Rankin (Stroke Patients Only)       Balance Overall balance assessment: Needs assistance         Standing balance support: Bilateral upper extremity supported, Reliant on assistive device for balance, During functional activity Standing balance-Leahy Scale: Fair                              Cognition Arousal/Alertness: Awake/alert Behavior During Therapy: WFL for tasks assessed/performed Overall Cognitive Status: Within Functional Limits for tasks assessed                                          Exercises Total Joint Exercises Ankle Circles/Pumps: AROM, Both, 10 reps Quad Sets: AROM, Both, 10 reps Heel Slides: AAROM, Left, 10 reps Hip ABduction/ADduction: AAROM, Left, 10 reps, Standing, Seated Knee Flexion: AROM, Left, 5 reps, Standing Marching in Standing: AROM, Right, Left, 5 reps, Standing General Exercises - Lower Extremity Heel Raises: AROM, Both, 10 reps, Standing    General Comments        Pertinent Vitals/Pain Pain Assessment Pain Assessment: Faces Faces Pain Scale: Hurts little more Pain Location: L hip/thigh Pain Descriptors / Indicators: Discomfort, Tightness, Sore Pain Intervention(s): Monitored during session, Repositioned    Home Living  Prior Function            PT Goals (current goals can now be found in the care plan section) Progress towards PT goals: Progressing toward goals    Frequency    7X/week      PT Plan Current plan remains appropriate    Co-evaluation              AM-PAC PT "6 Clicks" Mobility   Outcome Measure  Help needed turning from your back to your side while in a flat bed without using bedrails?: A Little Help needed moving from lying on your back to  sitting on the side of a flat bed without using bedrails?: A Little Help needed moving to and from a bed to a chair (including a wheelchair)?: A Little Help needed standing up from a chair using your arms (e.g., wheelchair or bedside chair)?: A Little Help needed to walk in hospital room?: A Little Help needed climbing 3-5 steps with a railing? : A Little 6 Click Score: 18    End of Session Equipment Utilized During Treatment: Gait belt Activity Tolerance: Patient tolerated treatment well Patient left: in chair;with call bell/phone within reach;with family/visitor present   PT Visit Diagnosis: Other abnormalities of gait and mobility (R26.89);Muscle weakness (generalized) (M62.81)     Time: 4098-1191 PT Time Calculation (min) (ACUTE ONLY): 21 min  Charges:    $Gait Training: 8-22 mins PT General Charges $$ ACUTE PT VISIT: 1 Visit                         Faye Ramsay, PT Acute Rehabilitation  Office: [phone]

## 2023-05-23 ENCOUNTER — Other Ambulatory Visit: Payer: Self-pay | Admitting: Cardiology

## 2023-05-23 DIAGNOSIS — I480 Paroxysmal atrial fibrillation: Secondary | ICD-10-CM

## 2023-05-24 NOTE — Discharge Summary (Signed)
Patient ID: April Duke MRN: [id number] DOB/AGE: 03/28/1946 77 y.o.  Admit date: 05/17/2023 Discharge date: 05/18/2023  Admission Diagnoses:  Left hip osteoarthritis  Discharge Diagnoses:  Principal Problem:   S/P total left hip arthroplasty   Past Medical History:  Diagnosis Date   Atrial fibrillation (HCC)    Hyperlipidemia    Hypertension    Hypothyroidism    Osteoporosis    Pneumonia    Thyroid disease     Surgeries: Procedure(s): TOTAL HIP ARTHROPLASTY ANTERIOR APPROACH on 05/17/2023   Consultants:   Discharged Condition: Improved  Hospital Course: April Duke is an 77 y.o. female who was admitted 05/17/2023 for operative treatment ofS/P total left hip arthroplasty. Patient has severe unremitting pain that affects sleep, daily activities, and work/hobbies. After pre-op clearance the patient was taken to the operating room on 05/17/2023 and underwent  Procedure(s): TOTAL HIP ARTHROPLASTY ANTERIOR APPROACH.    Patient was given perioperative antibiotics:  Anti-infectives (From admission, onward)    Start     Dose/Rate Route Frequency Ordered Stop   05/18/23 0000  nitrofurantoin, macrocrystal-monohydrate, (MACROBID) 100 MG capsule        100 mg Oral 2 times daily 05/18/23 0755 05/25/23 2359   05/17/23 1430  ceFAZolin (ANCEF) IVPB 2g/100 mL premix        2 g 200 mL/hr over 30 Minutes Intravenous Every 6 hours 05/17/23 1339 05/17/23 2227   05/17/23 0745  ceFAZolin (ANCEF) IVPB 2g/100 mL premix        2 g 200 mL/hr over 30 Minutes Intravenous On call to O.R. 05/17/23 0741 05/17/23 1050        Patient was given sequential compression devices, early ambulation, and chemoprophylaxis to prevent DVT. Patient worked with PT and was meeting their goals regarding safe ambulation and transfers.  Patient benefited maximally from hospital stay and there were no complications.    Recent vital signs: No data found.   Recent laboratory studies: No results for input(s): "WBC",  "HGB", "HCT", "PLT", "NA", "K", "CL", "CO2", "BUN", "CREATININE", "GLUCOSE", "INR", "CALCIUM" in the last 72 hours.  Invalid input(s): "PT", "2"   Discharge Medications:   Allergies as of 05/18/2023       Reactions   Alcohol Palpitations      Sulfa Antibiotics Rash   Ethanol    RAPID HEARTRATE         Medication List     TAKE these medications    acetaminophen 500 MG tablet Commonly known as: TYLENOL Take 2 tablets (1,000 mg total) by mouth every 6 (six) hours. What changed:  when to take this reasons to take this   amLODipine 5 MG tablet Commonly known as: NORVASC Take 1 tablet by mouth once daily   atenolol 50 MG tablet Commonly known as: TENORMIN Take 1 tablet (50 mg total) by mouth daily.   calcium carbonate 1500 (600 Ca) MG Tabs tablet Commonly known as: OSCAL Take by mouth 2 (two) times daily with a meal.   captopril 25 MG tablet Commonly known as: CAPOTEN Take 1 tablet (25 mg total) by mouth daily.   methocarbamol 500 MG tablet Commonly known as: ROBAXIN Take 1 tablet (500 mg total) by mouth every 6 (six) hours as needed for muscle spasms.   multivitamin tablet Take 1 tablet by mouth daily.   nitrofurantoin (macrocrystal-monohydrate) 100 MG capsule Commonly known as: Macrobid Take 1 capsule (100 mg total) by mouth 2 (two) times daily for 7 days.   polyethylene glycol 17 g packet Commonly known  as: MIRALAX / GLYCOLAX Take 17 g by mouth 2 (two) times daily.   rosuvastatin 5 MG tablet Commonly known as: CRESTOR Take 1 tablet (5 mg total) by mouth daily.   Synthroid 50 MCG tablet Generic drug: levothyroxine Take 1 tablet (50 mcg total) by mouth daily.   traMADol 50 MG tablet Commonly known as: ULTRAM Take 1-2 tablets (50-100 mg total) by mouth every 6 (six) hours as needed for severe pain.               Discharge Care Instructions  (From admission, onward)           Start     Ordered   05/18/23 0000  Change dressing        Comments: Maintain surgical dressing until follow up in the clinic. If the edges start to pull up, may reinforce with tape. If the dressing is no longer working, may remove and cover with gauze and tape, but must keep the area dry and clean.  Call with any questions or concerns.   05/18/23 0755            Diagnostic Studies: DG HIP UNILAT WITH PELVIS 1V LEFT  Result Date: 05/17/2023 CLINICAL DATA:  Intraoperative fluoroscopy for total left hip arthroplasty. EXAM: DG HIP (WITH OR WITHOUT PELVIS) 1V*L* COMPARISON:  Bilateral hip radiographs 12/06/2021, AP pelvis 11/23/2022 FINDINGS: Images were performed intraoperatively without the presence of a radiologist. Severe superior left femoroacetabular joint space narrowing and bone-on-bone contact, subchondral sclerosis, and moderate superior femoral head cortical flattening/remodeling, similar to prior. The patient subsequently is undergoing total left hip arthroplasty. No hardware complication is seen. Partial visualization of more remote total right hip arthroplasty. Total fluoroscopy images: 8 Total fluoroscopy time: 10 seconds Total dose: Radiation Exposure Index (as provided by the fluoroscopic device): 1.16 mGy air Kerma Please see intraoperative findings for further detail. IMPRESSION: Intraoperative fluoroscopy for total left hip arthroplasty. Electronically Signed   By: Neita Garnet M.D.   On: 05/17/2023 13:27   DG Pelvis Portable  Result Date: 05/17/2023 CLINICAL DATA:  Status post hip arthroplasty. EXAM: PORTABLE PELVIS 1-2 VIEWS COMPARISON:  None Available. FINDINGS: Left hip arthroplasty in expected alignment. No periprosthetic lucency or fracture. Recent postsurgical change includes air and edema in the soft tissues. Previous right hip arthroplasty. IMPRESSION: Left hip arthroplasty without immediate postoperative complication. Electronically Signed   By: Narda Rutherford M.D.   On: 05/17/2023 13:25   DG C-Arm 1-60 Min-No Report  Result  Date: 05/17/2023 Fluoroscopy was utilized by the requesting physician.  No radiographic interpretation.   DG C-Arm 1-60 Min-No Report  Result Date: 05/17/2023 Fluoroscopy was utilized by the requesting physician.  No radiographic interpretation.    Disposition: Discharge disposition: 01-Home or Self Care       Discharge Instructions     Call MD / Call 911   Complete by: As directed    If you experience chest pain or shortness of breath, CALL 911 and be transported to the hospital emergency room.  If you develope a fever above 101 F, pus (white drainage) or increased drainage or redness at the wound, or calf pain, call your surgeon's office.   Change dressing   Complete by: As directed    Maintain surgical dressing until follow up in the clinic. If the edges start to pull up, may reinforce with tape. If the dressing is no longer working, may remove and cover with gauze and tape, but must keep the area dry and clean.  Call with any questions or concerns.   Constipation Prevention   Complete by: As directed    Drink plenty of fluids.  Prune juice may be helpful.  You may use a stool softener, such as Colace (over the counter) 100 mg twice a day.  Use MiraLax (over the counter) for constipation as needed.   Diet - low sodium heart healthy   Complete by: As directed    Increase activity slowly as tolerated   Complete by: As directed    Weight bearing as tolerated with assist device (walker, cane, etc) as directed, use it as long as suggested by your surgeon or therapist, typically at least 4-6 weeks.   Post-operative opioid taper instructions:   Complete by: As directed    POST-OPERATIVE OPIOID TAPER INSTRUCTIONS: It is important to wean off of your opioid medication as soon as possible. If you do not need pain medication after your surgery it is ok to stop day one. Opioids include: Codeine, Hydrocodone(Norco, Vicodin), Oxycodone(Percocet, oxycontin) and hydromorphone amongst others.   Long term and even short term use of opiods can cause: Increased pain response Dependence Constipation Depression Respiratory depression And more.  Withdrawal symptoms can include Flu like symptoms Nausea, vomiting And more Techniques to manage these symptoms Hydrate well Eat regular healthy meals Stay active Use relaxation techniques(deep breathing, meditating, yoga) Do Not substitute Alcohol to help with tapering If you have been on opioids for less than two weeks and do not have pain than it is ok to stop all together.  Plan to wean off of opioids This plan should start within one week post op of your joint replacement. Maintain the same interval or time between taking each dose and first decrease the dose.  Cut the total daily intake of opioids by one tablet each day Next start to increase the time between doses. The last dose that should be eliminated is the evening dose.      TED hose   Complete by: As directed    Use stockings (TED hose) for 2 weeks on both leg(s).  You may remove them at night for sleeping.        Follow-up Information     Durene Romans, MD. Go on 06/01/2023.   Specialty: Orthopedic Surgery Why: You are scheduled for first post op appt on Wednesday August 7 at 11:45am. Contact information: [address] [place] [address] [phone]                  Signed: Cassandria Anger 05/24/2023, 9:00 AM

## 2023-05-24 NOTE — Telephone Encounter (Signed)
Prescription refill request for Xarelto received.  Indication:afib Last office visit:4/24 Weight:56  kg Age:77 Scr:0.68  7/24 CrCl:60.28  ml/min  Prescription refilled

## 2023-06-02 ENCOUNTER — Other Ambulatory Visit: Payer: Self-pay | Admitting: Family Medicine

## 2023-07-01 ENCOUNTER — Other Ambulatory Visit: Payer: Self-pay | Admitting: Family Medicine

## 2023-07-21 ENCOUNTER — Other Ambulatory Visit: Payer: Medicare Other

## 2023-07-21 DIAGNOSIS — I10 Essential (primary) hypertension: Secondary | ICD-10-CM

## 2023-07-21 DIAGNOSIS — E782 Mixed hyperlipidemia: Secondary | ICD-10-CM

## 2023-07-22 LAB — CMP14+EGFR
ALT: 7 [IU]/L (ref 0–32)
AST: 18 [IU]/L (ref 0–40)
Albumin: 4.6 g/dL (ref 3.8–4.8)
Alkaline Phosphatase: 106 [IU]/L (ref 44–121)
BUN/Creatinine Ratio: 12 (ref 12–28)
BUN: 8 mg/dL (ref 8–27)
Bilirubin Total: 0.7 mg/dL (ref 0.0–1.2)
CO2: 22 mmol/L (ref 20–29)
Calcium: 10.4 mg/dL — ABNORMAL HIGH (ref 8.7–10.3)
Chloride: 98 mmol/L (ref 96–106)
Creatinine, Ser: 0.68 mg/dL (ref 0.57–1.00)
Globulin, Total: 2.9 g/dL (ref 1.5–4.5)
Glucose: 90 mg/dL (ref 70–99)
Potassium: 4.8 mmol/L (ref 3.5–5.2)
Sodium: 136 mmol/L (ref 134–144)
Total Protein: 7.5 g/dL (ref 6.0–8.5)
eGFR: 89 mL/min/{1.73_m2} (ref >59)

## 2023-07-22 LAB — LIPID PANEL
Chol/HDL Ratio: 2.3 {ratio} (ref 0.0–4.4)
Cholesterol, Total: 133 mg/dL (ref 100–199)
HDL: 58 mg/dL (ref >39)
LDL Chol Calc (NIH): 60 mg/dL (ref 0–99)
Triglycerides: 78 mg/dL (ref 0–149)
VLDL Cholesterol Cal: 15 mg/dL (ref 5–40)

## 2023-07-22 LAB — CBC WITH DIFFERENTIAL/PLATELET
Basophils Absolute: 0.1 10*3/uL (ref 0.0–0.2)
Basos: 1 %
EOS (ABSOLUTE): 0.2 10*3/uL (ref 0.0–0.4)
Eos: 5 %
Hematocrit: 39 % (ref 34.0–46.6)
Hemoglobin: 12.4 g/dL (ref 11.1–15.9)
Immature Grans (Abs): 0 10*3/uL (ref 0.0–0.1)
Immature Granulocytes: 0 %
Lymphocytes Absolute: 1.4 10*3/uL (ref 0.7–3.1)
Lymphs: 30 %
MCH: 28.1 pg (ref 26.6–33.0)
MCHC: 31.8 g/dL (ref 31.5–35.7)
MCV: 88 fL (ref 79–97)
Monocytes Absolute: 0.7 10*3/uL (ref 0.1–0.9)
Monocytes: 14 %
Neutrophils Absolute: 2.4 10*3/uL (ref 1.4–7.0)
Neutrophils: 50 %
Platelets: 237 10*3/uL (ref 150–450)
RBC: 4.41 x10E6/uL (ref 3.77–5.28)
RDW: 14.8 % (ref 11.7–15.4)
WBC: 4.7 10*3/uL (ref 3.4–10.8)

## 2023-07-25 ENCOUNTER — Encounter: Payer: Self-pay | Admitting: Family Medicine

## 2023-07-25 ENCOUNTER — Ambulatory Visit (INDEPENDENT_AMBULATORY_CARE_PROVIDER_SITE_OTHER): Payer: Medicare Other | Admitting: Family Medicine

## 2023-07-25 VITALS — BP 122/70 | HR 61 | Temp 98.2°F | Resp 20 | Ht 64.0 in | Wt 126.2 lb

## 2023-07-25 DIAGNOSIS — E039 Hypothyroidism, unspecified: Secondary | ICD-10-CM | POA: Diagnosis not present

## 2023-07-25 DIAGNOSIS — R7303 Prediabetes: Secondary | ICD-10-CM

## 2023-07-25 DIAGNOSIS — I10 Essential (primary) hypertension: Secondary | ICD-10-CM

## 2023-07-25 DIAGNOSIS — E782 Mixed hyperlipidemia: Secondary | ICD-10-CM | POA: Diagnosis not present

## 2023-07-25 MED ORDER — AMLODIPINE BESYLATE 5 MG PO TABS
5.0000 mg | ORAL_TABLET | Freq: Every day | ORAL | 3 refills | Status: DC
Start: 2023-07-25 — End: 2024-06-22

## 2023-07-25 MED ORDER — ROSUVASTATIN CALCIUM 5 MG PO TABS
5.0000 mg | ORAL_TABLET | Freq: Every day | ORAL | 3 refills | Status: DC
Start: 2023-07-25 — End: 2024-06-22

## 2023-07-25 MED ORDER — SYNTHROID 50 MCG PO TABS
50.0000 ug | ORAL_TABLET | Freq: Every day | ORAL | 3 refills | Status: DC
Start: 2023-07-25 — End: 2024-06-22

## 2023-07-25 NOTE — Progress Notes (Signed)
BP 122/70   Pulse 61   Temp 98.2 F (36.8 C) (Oral)   Resp 20   Ht 5\' 4"  (1.626 m)   Wt 126 lb 4 oz (57.3 kg)   SpO2 95%   BMI 21.67 kg/m    Subjective:   Patient ID: April Duke, female    DOB: 1946/10/05, 77 y.o.   MRN: [id number]  HPI: April Duke is a 77 y.o. female presenting on 07/25/2023 for Medical Management of Chronic Issues   HPI Prediabetes Patient comes in today for recheck of his diabetes. Patient has been currently taking no medicine currently. Patient is currently on an ACE inhibitor/ARB. Patient has not seen an ophthalmologist this year. Patient denies any new issues with their feet. The symptom started onset as an adult hypertension and hyperlipidemia and hypothyroidism ARE RELATED TO DM   Hypertension Patient is currently on amlodipine and atenolol and captopril, and their blood pressure today is 122/70. Patient denies any lightheadedness or dizziness. Patient denies headaches, blurred vision, chest pains, shortness of breath, or weakness. Denies any side effects from medication and is content with current medication.   Hyperlipidemia Patient is coming in for recheck of his hyperlipidemia. The patient is currently taking Crestor. They deny any issues with myalgias or history of liver damage from it. They deny any focal numbness or weakness or chest pain.   Hypothyroidism recheck Patient is coming in for thyroid recheck today as well. They deny any issues with hair changes or heat or cold problems or diarrhea or constipation. They deny any chest pain or palpitations. They are currently on levothyroxine 50 micrograms   Relevant past medical, surgical, family and social history reviewed and updated as indicated. Interim medical history since our last visit reviewed. Allergies and medications reviewed and updated.  Review of Systems  Constitutional:  Negative for chills and fever.  HENT:  Negative for congestion, ear discharge, ear pain and tinnitus.   Eyes:   Negative for pain, redness and visual disturbance.  Respiratory:  Negative for cough, chest tightness, shortness of breath and wheezing.   Cardiovascular:  Negative for chest pain, palpitations and leg swelling.  Gastrointestinal:  Negative for abdominal pain, blood in stool, constipation and diarrhea.  Genitourinary:  Negative for difficulty urinating, dysuria and hematuria.  Musculoskeletal:  Negative for back pain, gait problem and myalgias.  Skin:  Negative for rash.  Neurological:  Negative for dizziness, weakness, light-headedness and headaches.  Psychiatric/Behavioral:  Negative for agitation, behavioral problems and suicidal ideas.   All other systems reviewed and are negative.   Per HPI unless specifically indicated above   Allergies as of 07/25/2023       Reactions   Alcohol Palpitations      Sulfa Antibiotics Rash   Ethanol    RAPID HEARTRATE         Medication List        Accurate as of July 25, 2023  4:19 PM. If you have any questions, ask your nurse or doctor.          acetaminophen 500 MG tablet Commonly known as: TYLENOL Take 2 tablets (1,000 mg total) by mouth every 6 (six) hours. What changed:  when to take this reasons to take this   amLODipine 5 MG tablet Commonly known as: NORVASC Take 1 tablet (5 mg total) by mouth daily.   atenolol 50 MG tablet Commonly known as: TENORMIN Take 1 tablet (50 mg total) by mouth daily.   calcium carbonate 1500 (600 Ca)  MG Tabs tablet Commonly known as: OSCAL Take by mouth 2 (two) times daily with a meal.   captopril 25 MG tablet Commonly known as: CAPOTEN Take 1 tablet (25 mg total) by mouth daily.   methocarbamol 500 MG tablet Commonly known as: ROBAXIN Take 1 tablet (500 mg total) by mouth every 6 (six) hours as needed for muscle spasms.   multivitamin tablet Take 1 tablet by mouth daily.   polyethylene glycol 17 g packet Commonly known as: MIRALAX / GLYCOLAX Take 17 g by mouth 2 (two)  times daily.   rosuvastatin 5 MG tablet Commonly known as: CRESTOR Take 1 tablet (5 mg total) by mouth daily.   Synthroid 50 MCG tablet Generic drug: levothyroxine Take 1 tablet (50 mcg total) by mouth daily.   traMADol 50 MG tablet Commonly known as: ULTRAM Take 1-2 tablets (50-100 mg total) by mouth every 6 (six) hours as needed for severe pain.   Xarelto 20 MG Tabs tablet Generic drug: rivaroxaban TAKE 1 TABLET BY MOUTH ONCE DAILY WITH SUPPER         Objective:   Temp 98.2 F (36.8 C) (Oral)   Resp 20   Ht 5\' 4"  (1.626 m)   Wt 126 lb 4 oz (57.3 kg)   SpO2 95%   BMI 21.67 kg/m   Wt Readings from Last 3 Encounters:  07/25/23 126 lb 4 oz (57.3 kg)  05/17/23 123 lb 7.3 oz (56 kg)  05/04/23 124 lb 9 oz (56.5 kg)    Physical Exam Vitals and nursing note reviewed.  Constitutional:      General: She is not in acute distress.    Appearance: She is well-developed. She is not diaphoretic.  Eyes:     Conjunctiva/sclera: Conjunctivae normal.  Cardiovascular:     Rate and Rhythm: Normal rate and regular rhythm.     Heart sounds: Normal heart sounds. No murmur heard. Pulmonary:     Effort: Pulmonary effort is normal. No respiratory distress.     Breath sounds: Normal breath sounds. No wheezing.  Musculoskeletal:        General: No tenderness. Normal range of motion.  Skin:    General: Skin is warm and dry.     Findings: No rash.  Neurological:     Mental Status: She is alert and oriented to person, place, and time.     Coordination: Coordination normal.  Psychiatric:        Behavior: Behavior normal.     Results for orders placed or performed in visit on 07/21/23  CBC with Differential/Platelet  Result Value Ref Range   WBC 4.7 3.4 - 10.8 x10E3/uL   RBC 4.41 3.77 - 5.28 x10E6/uL   Hemoglobin 12.4 11.1 - 15.9 g/dL   Hematocrit 69.6 29.5 - 46.6 %   MCV 88 79 - 97 fL   MCH 28.1 26.6 - 33.0 pg   MCHC 31.8 31.5 - 35.7 g/dL   RDW 28.4 13.2 - 44.0 %   Platelets  237 150 - 450 x10E3/uL   Neutrophils 50 Not Estab. %   Lymphs 30 Not Estab. %   Monocytes 14 Not Estab. %   Eos 5 Not Estab. %   Basos 1 Not Estab. %   Neutrophils Absolute 2.4 1.4 - 7.0 x10E3/uL   Lymphocytes Absolute 1.4 0.7 - 3.1 x10E3/uL   Monocytes Absolute 0.7 0.1 - 0.9 x10E3/uL   EOS (ABSOLUTE) 0.2 0.0 - 0.4 x10E3/uL   Basophils Absolute 0.1 0.0 - 0.2 x10E3/uL  Immature Granulocytes 0 Not Estab. %   Immature Grans (Abs) 0.0 0.0 - 0.1 x10E3/uL  CMP14+EGFR  Result Value Ref Range   Glucose 90 70 - 99 mg/dL   BUN 8 8 - 27 mg/dL   Creatinine, Ser 1.61 0.57 - 1.00 mg/dL   eGFR 89 >09 UE/AVW/0.98   BUN/Creatinine Ratio 12 12 - 28   Sodium 136 134 - 144 mmol/L   Potassium 4.8 3.5 - 5.2 mmol/L   Chloride 98 96 - 106 mmol/L   CO2 22 20 - 29 mmol/L   Calcium 10.4 (H) 8.7 - 10.3 mg/dL   Total Protein 7.5 6.0 - 8.5 g/dL   Albumin 4.6 3.8 - 4.8 g/dL   Globulin, Total 2.9 1.5 - 4.5 g/dL   Bilirubin Total 0.7 0.0 - 1.2 mg/dL   Alkaline Phosphatase 106 44 - 121 IU/L   AST 18 0 - 40 IU/L   ALT 7 0 - 32 IU/L  Lipid panel  Result Value Ref Range   Cholesterol, Total 133 100 - 199 mg/dL   Triglycerides 78 0 - 149 mg/dL   HDL 58 >11 mg/dL   VLDL Cholesterol Cal 15 5 - 40 mg/dL   LDL Chol Calc (NIH) 60 0 - 99 mg/dL   Chol/HDL Ratio 2.3 0.0 - 4.4 ratio    Assessment & Plan:   Problem List Items Addressed This Visit       Cardiovascular and Mediastinum   HTN (hypertension) - Primary   Relevant Medications   amLODipine (NORVASC) 5 MG tablet   rosuvastatin (CRESTOR) 5 MG tablet     Endocrine   Hypothyroid   Relevant Medications   SYNTHROID 50 MCG tablet     Other   Hyperlipemia   Relevant Medications   amLODipine (NORVASC) 5 MG tablet   rosuvastatin (CRESTOR) 5 MG tablet   Prediabetes    Continue current medicine, seems to be doing well, blood work looks good.  No changes Follow up plan: Return in about 4 months (around 11/24/2023), or if symptoms worsen or fail to  improve, for Hypertension and prediabetes and thyroid recheck.  Counseling provided for all of the vaccine components No orders of the defined types were placed in this encounter.   Arville Care, MD [facility] 07/25/2023, 4:19 PM

## 2023-08-30 ENCOUNTER — Ambulatory Visit: Payer: Medicare Other

## 2023-08-30 VITALS — Ht 64.0 in | Wt 126.0 lb

## 2023-08-30 DIAGNOSIS — Z0000 Encounter for general adult medical examination without abnormal findings: Secondary | ICD-10-CM

## 2023-08-30 NOTE — Patient Instructions (Signed)
Ms. Conway , Thank you for taking time to come for your Medicare Wellness Visit. I appreciate your ongoing commitment to your health goals. Please review the following plan we discussed and let me know if I can assist you in the future.   Referrals/Orders/Follow-Ups/Clinician Recommendations: Aim for 30 minutes of exercise or brisk walking, 6-8 glasses of water, and 5 servings of fruits and vegetables each day.   This is a list of the screening recommended for you and due dates:  Health Maintenance  Topic Date Due   DEXA scan (bone density measurement)  12/26/2021   COVID-19 Vaccine (1 - 2023-24 season) Never done   Pneumonia Vaccine (1 of 1 - PCV) 09/09/2023*   Flu Shot  01/23/2024*   Zoster (Shingles) Vaccine (1 of 2) 02/10/2024*   Medicare Annual Wellness Visit  08/29/2024   DTaP/Tdap/Td vaccine (2 - Td or Tdap) 04/15/2025   Hepatitis C Screening  Completed   HPV Vaccine  Aged Out  *Topic was postponed. The date shown is not the original due date.    Advanced directives: (Provided) Advance directive discussed with you today. I have provided a copy for you to complete at home and have notarized. Once this is complete, please bring a copy in to our office so we can scan it into your chart. Information on Advanced Care Planning can be found at [facility] Advance Health Care Directives Advance Health Care Directives ([email or web address]    Next Medicare Annual Wellness Visit scheduled for next year: Yes  Insert Preventive Care attachment Insert FALL PREVENTION attachment if needed

## 2023-08-30 NOTE — Progress Notes (Signed)
Subjective:   April Duke is a 77 y.o. female who presents for Medicare Annual (Subsequent) preventive examination.  Visit Complete: Virtual I connected with  [name] on 08/30/23 by a audio enabled telemedicine application and verified that I am speaking with the correct person using two identifiers.  Patient Location: Home  Provider Location: Home Office  I discussed the limitations of evaluation and management by telemedicine. The patient expressed understanding and agreed to proceed.  Vital Signs: Because this visit was a virtual/telehealth visit, some criteria may be missing or patient reported. Any vitals not documented were not able to be obtained and vitals that have been documented are patient reported.  Patient Medicare AWV questionnaire was completed by the patient on 08/30/2023; I have confirmed that all information answered by patient is correct and no changes since this date.  Cardiac Risk Factors include: advanced age (>51men, >17 women);dyslipidemia;hypertension     Objective:    Today's Vitals   08/30/23 1102  Weight: 126 lb (57.2 kg)  Height: 5\' 4"  (1.626 m)   Body mass index is 21.63 kg/m.     08/30/2023   11:06 AM 05/17/2023    8:00 AM 05/04/2023    2:25 PM 11/23/2022    3:00 PM 11/11/2022    2:11 PM 02/02/2022    9:14 AM 12/06/2021   11:35 AM  Advanced Directives  Does Patient Have a Medical Advance Directive? No No No No No No No  Would patient like information on creating a medical advance directive? Yes (MAU/Ambulatory/Procedural Areas - Information given) No - Patient declined No - Patient declined No - Patient declined No - Patient declined No - Patient declined No - Patient declined    Current Medications (verified) Outpatient Encounter Medications as of 08/30/2023  Medication Sig   acetaminophen (TYLENOL) 500 MG tablet Take 2 tablets (1,000 mg total) by mouth every 6 (six) hours. (Patient taking differently: Take 1,000 mg by mouth every 6 (six)  hours as needed for mild pain (pain score 1-3) or moderate pain (pain score 4-6).)   amLODipine (NORVASC) 5 MG tablet Take 1 tablet (5 mg total) by mouth daily.   atenolol (TENORMIN) 50 MG tablet Take 1 tablet (50 mg total) by mouth daily.   calcium carbonate (OSCAL) 1500 (600 Ca) MG TABS tablet Take by mouth 2 (two) times daily with a meal.   captopril (CAPOTEN) 25 MG tablet Take 1 tablet (25 mg total) by mouth daily.   methocarbamol (ROBAXIN) 500 MG tablet Take 1 tablet (500 mg total) by mouth every 6 (six) hours as needed for muscle spasms.   Multiple Vitamin (MULTIVITAMIN) tablet Take 1 tablet by mouth daily.   polyethylene glycol (MIRALAX / GLYCOLAX) 17 g packet Take 17 g by mouth 2 (two) times daily.   rosuvastatin (CRESTOR) 5 MG tablet Take 1 tablet (5 mg total) by mouth daily.   SYNTHROID 50 MCG tablet Take 1 tablet (50 mcg total) by mouth daily.   traMADol (ULTRAM) 50 MG tablet Take 1-2 tablets (50-100 mg total) by mouth every 6 (six) hours as needed for severe pain.   XARELTO 20 MG TABS tablet TAKE 1 TABLET BY MOUTH ONCE DAILY WITH SUPPER   No facility-administered encounter medications on file as of 08/30/2023.    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   Procedure: TOTAL HIP ARTHROPLASTY ANTERIOR APPROACH;  Surgeon: Durene Romans, MD;  Location: [facility];  Service: Orthopedics;  Laterality: Right;   TOTAL HIP ARTHROPLASTY Left 05/17/2023   Procedure: TOTAL HIP ARTHROPLASTY ANTERIOR APPROACH;  Surgeon: Durene Romans, MD;  Location: [facility];  Service: Orthopedics;  Laterality: Left;   Family History  Problem Relation Age of Onset   Cancer Mother        breast    Hypertension Mother    Hypertension Father    Pulmonary embolism Sister    Lung cancer Brother    Hypertension Brother    Hyperlipidemia Brother    Social History   Socioeconomic History   Marital status: Married    Spouse name: Not on file   Number of children: 3   Years of education: Not on file   Highest education level: Not on file  Occupational History   Occupation: retired  Tobacco Use   Smoking status: Never   Smokeless tobacco: Never  Vaping Use   Vaping status: Never Used  Substance and Sexual Activity   Alcohol use: Never   Drug use: Never   Sexual activity: Not on file    Comment: married 56 years in 2020  Other Topics Concern   Not on file  Social History Narrative   Lives with husband.  Has 3 sons.     Social Determinants of Health   Financial Resource Strain: Low Risk  (08/30/2023)   Overall Financial Resource Strain (CARDIA)    Difficulty of Paying Living Expenses: Not hard at all  Food Insecurity: No Food Insecurity (08/30/2023)   Hunger Vital Sign    Worried About Running Out of Food in the Last Year: Never true    Ran Out of Food in the Last Year: Never true  Transportation Needs: No Transportation Needs (08/30/2023)   PRAPARE - Administrator, Civil Service (Medical): No    Lack of Transportation (Non-Medical): No  Physical Activity: Inactive (08/30/2023)   Exercise Vital Sign    Days of Exercise per Week: 0 days    Minutes of Exercise per Session: 0 min  Stress: No Stress Concern Present (08/30/2023)   [employer] of Occupational Health - Occupational Stress Questionnaire    Feeling of Stress : Not at all  Social Connections: Moderately Integrated (08/30/2023)   Social Connection and Isolation Panel [NHANES]    Frequency of Communication with Friends and Family: More than three times a week    Frequency of Social Gatherings with Friends and Family: More than three times a week    Attends Religious Services: More than 4 times per  year    Active Member of [employer] or Organizations: No    Attends Engineer, structural: Never    Marital Status: Married    Tobacco Counseling Counseling given: Not Answered   Clinical Intake:  Pre-visit preparation completed: Yes  Pain : No/denies pain     Nutritional Risks: None Diabetes: No  How often do you need to have someone help you when you read instructions, pamphlets, or other written materials from your doctor or pharmacy?: 1 - Never  Interpreter Needed?: No  Information entered by :: Renie Ora, LPN   Activities of Daily Living    08/30/2023   11:06 AM 05/17/2023    1:43 PM  In your present state of health, do you have any difficulty performing the following activities:  Hearing? 0   Vision? 0   Difficulty concentrating or making decisions? 0   Walking or climbing stairs? 0   Dressing or bathing? 0   Doing errands, shopping? 0 0  Preparing Food and eating ? N   Using the Toilet? N   In the past six months, have you accidently leaked urine? N   Do you have problems with loss of bowel control? N   Managing your Medications? N   Managing your Finances? N   Housekeeping or managing your Housekeeping? N     Patient Care Team: Dettinger, Elige Radon, MD as PCP - General (Family Medicine) Rollene Rotunda, MD as PCP - Cardiology (Cardiology) Delora Fuel, OD (Optometry) Andrey Farmer, FNP (Orthopedic Surgery) Rollene Rotunda, MD as Consulting Physician (Cardiology)  Indicate any recent Medical Services you may have received from other than [facility] providers in the past year (date may be approximate).     Assessment:   This is a routine wellness examination for Saisha.  Hearing/Vision screen Vision Screening - Comments:: Wears rx glasses - up to date with routine eye exams with  Dr.Johnson    Goals Addressed             This Visit's Progress    LIFESTYLE - DECREASE FALLS RISK   On track    She has noticed balance problems; would like a  rolling walker       Depression Screen    08/30/2023   11:05 AM 07/25/2023    4:09 PM 01/10/2023   10:03 AM 09/08/2022   10:00 AM 05/03/2022    8:08 AM 02/02/2022    9:13 AM 01/01/2022    8:32 AM  PHQ 2/9 Scores  PHQ - 2 Score 0 1 2 2 2 1 1   PHQ- 9 Score 0 5 3 3 9 2 2     Fall Risk    08/30/2023   11:03 AM 07/25/2023    4:09 PM 01/10/2023   10:03 AM 09/08/2022   10:00 AM 05/03/2022    8:07 AM  Fall Risk   Falls in the past year? 0 0 0 0 0  Number falls in past yr: 0      Injury with Fall? 0      Risk for fall due to : No Fall Risks      Follow up Falls prevention discussed Falls evaluation completed       MEDICARE RISK AT HOME: Medicare Risk at Home Any stairs in or around the home?: No If so, are there any without handrails?: No Home free of loose throw rugs in walkways, pet beds, electrical cords, etc?: Yes Adequate lighting in your home to reduce risk of falls?: Yes Life alert?: No Use of a cane, walker or w/c?: Yes Grab bars in the bathroom?: Yes Shower chair or bench in shower?: Yes Elevated toilet seat or a handicapped toilet?: Yes  TIMED UP AND GO:  Was the test performed?  No    Cognitive Function:        08/30/2023   11:06 AM 02/02/2022    9:17 AM 01/08/2020    8:21 AM  6CIT Screen  What Year? 0 points 0 points 0 points  What month? 0 points 0 points   What time? 0 points 0 points 0 points  Count back from 20 0 points 0 points 0 points  Months in reverse 0 points 0  points   Repeat phrase 0 points 4 points 4 points  Total Score 0 points 4 points     Immunizations Immunization History  Administered Date(s) Administered   Tdap 04/16/2015    TDAP status: Up to date  Flu Vaccine status: Due, Education has been provided regarding the importance of this vaccine. Advised may receive this vaccine at local pharmacy or Health Dept. Aware to provide a copy of the vaccination record if obtained from local pharmacy or Health Dept. Verbalized acceptance and  understanding.  Pneumococcal vaccine status: Due, Education has been provided regarding the importance of this vaccine. Advised may receive this vaccine at local pharmacy or Health Dept. Aware to provide a copy of the vaccination record if obtained from local pharmacy or Health Dept. Verbalized acceptance and understanding.  Covid-19 vaccine status: Declined, Education has been provided regarding the importance of this vaccine but patient still declined. Advised may receive this vaccine at local pharmacy or Health Dept.or vaccine clinic. Aware to provide a copy of the vaccination record if obtained from local pharmacy or Health Dept. Verbalized acceptance and understanding.  Qualifies for Shingles Vaccine? Yes   Zostavax completed No   Shingrix Completed?: No.    Education has been provided regarding the importance of this vaccine. Patient has been advised to call insurance company to determine out of pocket expense if they have not yet received this vaccine. Advised may also receive vaccine at local pharmacy or Health Dept. Verbalized acceptance and understanding.  Screening Tests Health Maintenance  Topic Date Due   DEXA SCAN  12/26/2021   COVID-19 Vaccine (1 - 2023-24 season) Never done   Pneumonia Vaccine 56+ Years old (1 of 1 - PCV) 09/09/2023 (Originally 01/26/2010)   INFLUENZA VACCINE  01/23/2024 (Originally 05/26/2023)   Zoster Vaccines- Shingrix (1 of 2) 02/10/2024 (Originally 01/27/1995)   Medicare Annual Wellness (AWV)  08/29/2024   DTaP/Tdap/Td (2 - Td or Tdap) 04/15/2025   Hepatitis C Screening  Completed   HPV VACCINES  Aged Out    Health Maintenance  Health Maintenance Due  Topic Date Due   DEXA SCAN  12/26/2021   COVID-19 Vaccine (1 - 2023-24 season) Never done    Colorectal cancer screening: No longer required.   Mammogram status: No longer required due to age.  Bone Density status: Ordered declined . Pt provided with contact info and advised to call to schedule  appt.  Lung Cancer Screening: (Low Dose CT Chest recommended if Age 12-80 years, 20 pack-year currently smoking OR have quit w/in 15years.) does not qualify.   Lung Cancer Screening Referral: n/a  Additional Screening:  Hepatitis C Screening: does not qualify; Completed 08/27/2020  Vision Screening: Recommended annual ophthalmology exams for early detection of glaucoma and other disorders of the eye. Is the patient up to date with their annual eye exam?  Yes  Who is the provider or what is the name of the office in which the patient attends annual eye exams? Dr.johnson  If pt is not established with a provider, would they like to be referred to a provider to establish care? No .   Dental Screening: Recommended annual dental exams for proper oral hygiene   Community Resource Referral / Chronic Care Management: CRR required this visit?  No   CCM required this visit?  No     Plan:     I have personally reviewed and noted the following in the patient's chart:   Medical and social history Use of alcohol, tobacco or  illicit drugs  Current medications and supplements including opioid prescriptions. Patient is not currently taking opioid prescriptions. Functional ability and status Nutritional status Physical activity Advanced directives List of other physicians Hospitalizations, surgeries, and ER visits in previous 12 months Vitals Screenings to include cognitive, depression, and falls Referrals and appointments  In addition, I have reviewed and discussed with patient certain preventive protocols, quality metrics, and best practice recommendations. A written personalized care plan for preventive services as well as general preventive health recommendations were provided to patient.     Lorrene Reid, LPN   16/10/958   After Visit Summary: (MyChart) Due to this being a telephonic visit, the after visit summary with patients personalized plan was offered to patient via  MyChart   Nurse Notes: Due Pneumonia vaccine

## 2023-09-06 ENCOUNTER — Other Ambulatory Visit: Payer: Self-pay

## 2023-09-06 ENCOUNTER — Ambulatory Visit
Admission: EM | Admit: 2023-09-06 | Discharge: 2023-09-06 | Disposition: A | Discharge status: Home / Self Care | Payer: Medicare Other | Attending: Family Medicine | Admitting: Family Medicine

## 2023-09-06 ENCOUNTER — Ambulatory Visit: Payer: Medicare Other

## 2023-09-06 ENCOUNTER — Encounter: Payer: Self-pay | Admitting: Emergency Medicine

## 2023-09-06 DIAGNOSIS — M25551 Pain in right hip: Secondary | ICD-10-CM | POA: Diagnosis not present

## 2023-09-06 MED ORDER — DICLOFENAC SODIUM 1 % EX GEL: 2.0000 g | Freq: Four times a day (QID) | CUTANEOUS | 1 refills | Status: DC

## 2023-09-06 NOTE — Discharge Instructions (Signed)
I will call if your x-ray comes back showing any fracture, otherwise I have sent over an anti-inflammatory pain topical medication that you may use as needed in addition to Tylenol, heat, massage.  Follow-up with primary care or your orthopedist for a recheck if not resolving.

## 2023-09-06 NOTE — ED Provider Notes (Signed)
[facility]    CSN: [id number] Arrival date & time: 09/06/23  1527      History   Chief Complaint Chief Complaint  Patient presents with   Hip Pain    HPI [name] is a 77 y.o. female.   Patient presenting today with 2-day history of right hip, buttock and outer leg pain worse with weightbearing.  Denies known injury to the area, bruising, swelling, numbness, tingling, weakness.  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  Taking Tylenol with good temporary relief.    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   S/P total right hip arthroplasty 11/23/2022   Prediabetes 09/04/2021   Arthritis of right hip 01/30/2021   A-fib (HCC) 04/27/2019   HTN (hypertension) 04/27/2019   Hyperlipemia 04/27/2019   Hypothyroid 04/27/2019   Osteoporosis 04/27/2019   GERD (gastroesophageal reflux disease) 07/02/2014    Past Surgical History:  Procedure Laterality Date   CARDIOVERSION     TOTAL HIP ARTHROPLASTY Right 11/23/2022   Procedure: TOTAL HIP ARTHROPLASTY ANTERIOR APPROACH;  Surgeon: Durene Romans, MD;  Location: [facility];  Service: Orthopedics;  Laterality: Right;   TOTAL HIP ARTHROPLASTY Left 05/17/2023   Procedure: TOTAL HIP ARTHROPLASTY ANTERIOR APPROACH;  Surgeon: Durene Romans, MD;  Location: [facility];  Service: Orthopedics;  Laterality: Left;    OB History   No obstetric history on file.      Home Medications    Prior to Admission medications   Medication Sig Start Date End Date Taking? Authorizing Provider  diclofenac Sodium (VOLTAREN) 1 % GEL Apply 2 g topically 4 (four) times daily. 09/06/23  Yes Particia Nearing, PA-C  acetaminophen (TYLENOL) 500 MG tablet Take 2 tablets (1,000 mg total) by mouth every 6 (six)  hours. Patient taking differently: Take 1,000 mg by mouth every 6 (six) hours as needed for mild pain (pain score 1-3) or moderate pain (pain score 4-6). 11/24/22   Cassandria Anger, PA-C  amLODipine (NORVASC) 5 MG tablet Take 1 tablet (5 mg total) by mouth daily. 07/25/23   Dettinger, Elige Radon, MD  atenolol (TENORMIN) 50 MG tablet Take 1 tablet (50 mg total) by mouth daily. 02/17/23   Rollene Rotunda, MD  calcium carbonate (OSCAL) 1500 (600 Ca) MG TABS tablet Take by mouth 2 (two) times daily with a meal.    [provider]  captopril (CAPOTEN) 25 MG tablet Take 1 tablet (25 mg total) by mouth daily. 02/17/23   Rollene Rotunda, MD  methocarbamol (ROBAXIN) 500 MG tablet Take 1 tablet (500 mg total) by mouth every 6 (six) hours as needed for muscle spasms. 05/18/23   Cassandria Anger, PA-C  Multiple Vitamin (MULTIVITAMIN) tablet Take 1 tablet by mouth daily.    [provider]  polyethylene glycol (MIRALAX / GLYCOLAX) 17 g packet Take 17 g by mouth 2 (two) times daily. 05/18/23   Cassandria Anger, PA-C  rosuvastatin (CRESTOR) 5 MG tablet Take 1 tablet (5 mg total) by mouth daily. 07/25/23   Dettinger, Elige Radon, MD  SYNTHROID 50 MCG tablet Take 1 tablet (50 mcg total) by mouth daily. 07/25/23   Dettinger, Elige Radon, MD  traMADol (ULTRAM) 50 MG tablet Take 1-2 tablets (50-100  mg total) by mouth every 6 (six) hours as needed for severe pain. 05/18/23   Cassandria Anger, PA-C  XARELTO 20 MG TABS tablet TAKE 1 TABLET BY MOUTH ONCE DAILY WITH SUPPER 05/24/23   Rollene Rotunda, MD    Family History Family History  Problem Relation Age of Onset   Cancer Mother        breast   Hypertension Mother    Hypertension Father    Pulmonary embolism Sister    Lung cancer Brother    Hypertension Brother    Hyperlipidemia Brother     Social History Social History   Tobacco Use   Smoking status: Never   Smokeless tobacco: Never  Vaping Use   Vaping status: Never Used  Substance Use Topics    Alcohol use: Never   Drug use: Never     Allergies   Alcohol, Sulfa antibiotics, and Ethanol   Review of Systems Review of Systems Per HPI  Physical Exam Triage Vital Signs ED Triage Vitals  Encounter Vitals Group     BP 09/06/23 1539 (!) 165/83     Systolic BP Percentile --      Diastolic BP Percentile --      Pulse Rate 09/06/23 1539 (!) 46     Resp 09/06/23 1539 20     Temp 09/06/23 1539 98.1 F (36.7 C)     Temp Source 09/06/23 1539 Oral     SpO2 09/06/23 1539 97 %     Weight --      Height --      Head Circumference --      Peak Flow --      Pain Score 09/06/23 1538 8     Pain Loc --      Pain Education --      Exclude from Growth Chart --    No data found.  Updated Vital Signs BP (!) 165/83 (BP Location: Right Arm)   Pulse (!) 46   Temp 98.1 F (36.7 C) (Oral)   Resp 20   SpO2 97%   Visual Acuity Right Eye Distance:   Left Eye Distance:   Bilateral Distance:    Right Eye Near:   Left Eye Near:    Bilateral Near:     Physical Exam Vitals and nursing note reviewed.  Constitutional:      Appearance: Normal appearance. She is not ill-appearing.  HENT:     Head: Atraumatic.  Eyes:     Extraocular Movements: Extraocular movements intact.     Conjunctiva/sclera: Conjunctivae normal.  Cardiovascular:     Rate and Rhythm: Normal rate.  Pulmonary:     Effort: Pulmonary effort is normal.  Musculoskeletal:     Cervical back: Normal range of motion and neck supple.     Comments: Range of motion at baseline per patient, good active and passive range of motion in all directions of the right hip.  Able to bear weight with no instability.  No edema, discoloration, point tenderness.    Skin:    General: Skin is warm and dry.  Neurological:     Mental Status: She is alert and oriented to person, place, and time.     Motor: No weakness.     Comments: Bilateral lower extremities neurovascularly intact  Psychiatric:        Mood and Affect: Mood normal.         Thought Content: Thought content normal.        Judgment: Judgment normal.  UC Treatments / Results  Labs (all labs ordered are listed, but only abnormal results are displayed) Labs Reviewed - No data to display  EKG   Radiology DG Hip Unilat W or Wo Pelvis 2-3 Views Right  Result Date: 09/06/2023 CLINICAL DATA:  Two days of right hip radiating down the leg. No known injury. Right hip replacement in January. EXAM: DG HIP (WITH OR WITHOUT PELVIS) 2-3V RIGHT COMPARISON:  Pelvis radiograph 05/17/2023 FINDINGS: Right hip arthroplasty in expected alignment. No periprosthetic lucency. No acute or periprosthetic fracture. No erosive change or focal bone abnormality. The bones are subjectively under mineralized. There are vascular calcifications. IMPRESSION: Right hip arthroplasty without complication. Electronically Signed   By: Narda Rutherford M.D.   On: 09/06/2023 18:10    Procedures Procedures (including critical care time)  Medications Ordered in UC Medications - No data to display  Initial Impression / Assessment and Plan / UC Course  I have reviewed the triage vital signs and the nursing notes.  Pertinent labs & imaging results that were available during my care of the patient were reviewed by me and considered in my medical decision making (see chart for details).     Exam very reassuring, suspect muscular pain given wide distribution, worsening with weightbearing.  Very low suspicion for a joint issue given lack of injury, excellent strength and range of motion and recent joint replacement.  She is very concerned about her recent joint replacement and requesting an x-ray so this was performed today.  This was negative for acute abnormalities.  Discussed treatment with Voltaren gel, Tylenol, heat, massage, stretches.  Follow-up with PCP or orthopedics if not resolving.  Final Clinical Impressions(s) / UC Diagnoses   Final diagnoses:  Right hip pain     Discharge  Instructions      I will call if your x-ray comes back showing any fracture, otherwise I have sent over an anti-inflammatory pain topical medication that you may use as needed in addition to Tylenol, heat, massage.  Follow-up with primary care or your orthopedist for a recheck if not resolving.    ED Prescriptions     Medication Sig Dispense Auth. Provider   diclofenac Sodium (VOLTAREN) 1 % GEL Apply 2 g topically 4 (four) times daily. 150 g Particia Nearing, [place]      PDMP not reviewed this encounter.   Particia Nearing, [place] 09/06/23 1925

## 2023-09-06 NOTE — ED Triage Notes (Signed)
Pt reports right hip/buttock pain since waking up x2 days ago. Pt denies any known injury reports had right hip replaced in January 2024, reports left hip was replaced in July 2024. Uses cane at baseline.

## 2023-09-18 ENCOUNTER — Other Ambulatory Visit: Payer: Self-pay

## 2023-09-18 ENCOUNTER — Emergency Department (HOSPITAL_COMMUNITY)
Admission: EM | Admit: 2023-09-18 | Discharge: 2023-09-18 | Disposition: A | Discharge status: Home / Self Care | Payer: Medicare Other | Attending: Emergency Medicine | Admitting: Emergency Medicine

## 2023-09-18 ENCOUNTER — Encounter (HOSPITAL_COMMUNITY): Payer: Self-pay | Admitting: *Deleted

## 2023-09-18 ENCOUNTER — Emergency Department (HOSPITAL_COMMUNITY): Payer: Medicare Other

## 2023-09-18 DIAGNOSIS — E039 Hypothyroidism, unspecified: Secondary | ICD-10-CM | POA: Insufficient documentation

## 2023-09-18 DIAGNOSIS — Z7901 Long term (current) use of anticoagulants: Secondary | ICD-10-CM | POA: Diagnosis not present

## 2023-09-18 DIAGNOSIS — M5442 Lumbago with sciatica, left side: Secondary | ICD-10-CM | POA: Diagnosis not present

## 2023-09-18 DIAGNOSIS — M545 Low back pain, unspecified: Secondary | ICD-10-CM | POA: Diagnosis present

## 2023-09-18 MED ORDER — LIDOCAINE 5 % EX PTCH
1.0000 | MEDICATED_PATCH | CUTANEOUS | Status: DC
  Administered 2023-09-18: 1 via TRANSDERMAL
  Filled 2023-09-18: qty 1

## 2023-09-18 MED ORDER — DEXAMETHASONE SODIUM PHOSPHATE 4 MG/ML IJ SOLN
4.0000 mg | Freq: Once | INTRAMUSCULAR | Status: AC
  Administered 2023-09-18: 4 mg via INTRAMUSCULAR
  Filled 2023-09-18: qty 1

## 2023-09-18 NOTE — ED Notes (Signed)
Introduced self to pt Pt attached to partial monitor Pt complains of LEFT hip aching that radiates down LEFT buttocks  Pain 7/10 Started Wednesday

## 2023-09-18 NOTE — Discharge Instructions (Addendum)
As discussed, your imaging of your low back and hip are reassuring.   Use tylenol as needed for pain and use OTC lidocaine patches at the area of pain as well.  I have provided information for orthopedics to follow up with if pain persists.  Get help right away if: You develop severe pain. Your pain suddenly gets worse. You develop increasing weakness in your legs. You lose the ability to control your bladder or bowel. You have difficulty walking or balancing. You have a fever.

## 2023-09-18 NOTE — ED Notes (Signed)
Reassessed Pt stated that pain is feeling better 6/10 Pt stated she took APAP before leaving the house  Waiting on XRAY results

## 2023-09-18 NOTE — ED Provider Notes (Signed)
[place] EMERGENCY DEPARTMENT AT [facility] Provider Note   CSN: [id number] Arrival date & time: 09/18/23  1121     History  Chief Complaint  Patient presents with   Back Pain    April Duke is a 77 y.o. female with a history of osteoporosis, atrial fibrillation, and hypothyroidism who presents to the ED today for back pain. Patient reports left lower back pain for the past 4 days that radiates to her left buttock.  Denies any injury prior to the onset of pain.  No saddle anesthesia, lower extremity weakness, or urinary incontinence.  She has tried taking Tylenol without much relief.  No urinary frequency, difficulty, or hematuria.  No additional complaints or concerns at this time.    Home Medications Prior to Admission medications   Medication Sig Start Date End Date Taking? Authorizing Provider  acetaminophen (TYLENOL) 500 MG tablet Take 2 tablets (1,000 mg total) by mouth every 6 (six) hours. Patient taking differently: Take 1,000 mg by mouth every 6 (six) hours as needed for mild pain (pain score 1-3) or moderate pain (pain score 4-6). 11/24/22   Cassandria Anger, PA-C  amLODipine (NORVASC) 5 MG tablet Take 1 tablet (5 mg total) by mouth daily. 07/25/23   Dettinger, Elige Radon, MD  atenolol (TENORMIN) 50 MG tablet Take 1 tablet (50 mg total) by mouth daily. 02/17/23   Rollene Rotunda, MD  calcium carbonate (OSCAL) 1500 (600 Ca) MG TABS tablet Take by mouth 2 (two) times daily with a meal.    [provider]  captopril (CAPOTEN) 25 MG tablet Take 1 tablet (25 mg total) by mouth daily. 02/17/23   Rollene Rotunda, MD  diclofenac Sodium (VOLTAREN) 1 % GEL Apply 2 g topically 4 (four) times daily. 09/06/23   Particia Nearing, PA-C  methocarbamol (ROBAXIN) 500 MG tablet Take 1 tablet (500 mg total) by mouth every 6 (six) hours as needed for muscle spasms. 05/18/23   Cassandria Anger, PA-C  Multiple Vitamin (MULTIVITAMIN) tablet Take 1 tablet by mouth daily.     [provider]  polyethylene glycol (MIRALAX / GLYCOLAX) 17 g packet Take 17 g by mouth 2 (two) times daily. 05/18/23   Cassandria Anger, PA-C  rosuvastatin (CRESTOR) 5 MG tablet Take 1 tablet (5 mg total) by mouth daily. 07/25/23   Dettinger, Elige Radon, MD  SYNTHROID 50 MCG tablet Take 1 tablet (50 mcg total) by mouth daily. 07/25/23   Dettinger, Elige Radon, MD  traMADol (ULTRAM) 50 MG tablet Take 1-2 tablets (50-100 mg total) by mouth every 6 (six) hours as needed for severe pain. 05/18/23   Cassandria Anger, PA-C  XARELTO 20 MG TABS tablet TAKE 1 TABLET BY MOUTH ONCE DAILY WITH SUPPER 05/24/23   Rollene Rotunda, MD      Allergies    Alcohol, Sulfa antibiotics, and Ethanol    Review of Systems   Review of Systems  Musculoskeletal:  Positive for back pain.  All other systems reviewed and are negative.   Physical Exam Updated Vital Signs BP (!) 168/54   Pulse (!) 53   Temp 97.8 F (36.6 C) (Oral)   Resp 16   Ht 5\' 4"  (1.626 m)   Wt 54.4 kg   SpO2 97%   BMI 20.60 kg/m  Physical Exam Vitals and nursing note reviewed.  Constitutional:      General: She is not in acute distress.    Appearance: Normal appearance.  HENT:     Head:  Normocephalic and atraumatic.     Mouth/Throat:     Mouth: Mucous membranes are moist.  Eyes:     Conjunctiva/sclera: Conjunctivae normal.     Pupils: Pupils are equal, round, and reactive to light.  Cardiovascular:     Rate and Rhythm: Normal rate and regular rhythm.     Pulses: Normal pulses.     Heart sounds: Normal heart sounds.  Pulmonary:     Effort: Pulmonary effort is normal.     Breath sounds: Normal breath sounds.  Musculoskeletal:        General: Tenderness present.     Comments: Midline tenderness to palpation of lumbar spine and posterior hip. No cervical or thoracic tenderness. Strength, sensation, and ROM intact of upper and lower extremities bilaterally.  Skin:    General: Skin is warm and dry.     Findings: No rash.   Neurological:     General: No focal deficit present.     Mental Status: She is alert.     Sensory: No sensory deficit.     Motor: No weakness.  Psychiatric:        Mood and Affect: Mood normal.        Behavior: Behavior normal.     ED Results / Procedures / Treatments   Labs (all labs ordered are listed, but only abnormal results are displayed) Labs Reviewed - No data to display  EKG None  Radiology DG Lumbar Spine Complete  Result Date: 09/18/2023 CLINICAL DATA:  Complains of left hip aching that radiates down left buttocks. Left hip surgery 4 months ago. EXAM: LUMBAR SPINE - COMPLETE 4+ VIEW COMPARISON:  04/23/2021 FINDINGS: Osteopenia. Five lumbar type vertebra. There is a mild curvature of the lumbar spine which appears convex towards the left. The lumbar vertebral body heights are well preserved. The facet joints appear aligned. There is no sign of acute lumbar spine fracture or subluxation. Anterior wedge compression deformities are identified involving T11 and T12. These are age indeterminate but appear new compared with 04/23/2021. No signs of retropulsion of fracture fragments identified. Multi level disc space narrowing and endplate spurring is noted. This is most severe at L2-3. Facet arthropathy noted at L5-S1. Status post bilateral hip arthroplasty. Aortic atherosclerotic calcifications. IMPRESSION: 1. No acute findings within the lumbar spine. 2. Anterior wedge compression deformities involving T11 and T12 are age indeterminate but appear new compared with 04/23/2021. 3. 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 Electronically Signed   By: Signa Kell M.D.   On: 09/18/2023 12:47   DG Hip Unilat W or Wo Pelvis 2-3 Views Left  Result Date: 09/18/2023 CLINICAL DATA:  Left hip pain rating down into the buttock region. EXAM: DG HIP (WITH OR WITHOUT PELVIS) 2-3V LEFT COMPARISON:  04/17/2023 and 12/06/2021 FINDINGS: Bones are demineralized. Status post  bilateral total hip replacement. SI joints and symphysis pubis unremarkable. No evidence for sacral or pubic ramus fracture. AP and frog-leg lateral views of the left hip show no evidence for dislocation. No periprosthetic femur fracture. IMPRESSION: Status post bilateral total hip replacement without evidence for dislocation or left periprosthetic femur fracture. Electronically Signed   By: Kennith Center M.D.   On: 09/18/2023 12:44    Procedures Procedures: not indicated.   Medications Ordered in ED Medications  lidocaine (LIDODERM) 5 % 1 patch (1 patch Transdermal Patch Applied 09/18/23 1401)  dexamethasone (DECADRON) injection 4 mg (4 mg Intramuscular Given 09/18/23 1401)    ED Course/ Medical Decision Making/ A&P  Medical Decision Making Amount and/or Complexity of Data Reviewed Radiology: ordered.  Risk Prescription drug management.   This patient presents to the ED for concern of low back pain, this involves an extensive number of treatment options, and is a complaint that carries with it a high risk of complications and morbidity.   Differential diagnosis includes: Fracture, misalignment, bulging disc, radiculopathy, muscle strain, etc.`   Comorbidities  See HPI above   Additional History  Additional history obtained from prior records.   Imaging Studies  I ordered imaging studies including lumbar and left hip x-rays.  I independently visualized and interpreted imaging which showed:  No acute findings of the lumbar spine. Multilevel degenerative disc disease. Left hip x-ray with pelvis shows no dislocation. No periprosthetic femur fracture. I agree with the radiologist interpretation   Problem List / ED Course / Critical Interventions / Medication Management  Lumbar spine pain and left hip pain I ordered medications including: Lidocaine patch and Decadron for radiculopathy prior to discharge I have reviewed the patients home  medicines and have made adjustments as needed   Social Determinants of Health  Physical activity   Test / Admission - Considered  Discussed findings with patient. She is stable and safe for discharge home. Return precautions given.       Final Clinical Impression(s) / ED Diagnoses Final diagnoses:  Acute left-sided low back pain with left-sided sciatica    Rx / DC Orders ED Discharge Orders     None         Maxwell Marion, PA-C 09/18/23 1523    Gloris Manchester, MD 09/18/23 [phone]

## 2023-09-18 NOTE — ED Triage Notes (Signed)
Pt with left lower back pain since Thursday, denies any falls.  Has tried tylenol for pain with little relief.

## 2023-10-13 ENCOUNTER — Telehealth: Payer: Self-pay

## 2023-10-13 NOTE — Progress Notes (Signed)
Transition Care Management Unsuccessful Follow-up Telephone Call  Date of discharge and from where:  [facility]  Attempts:  1st Attempt  Reason for unsuccessful TCM follow-up call:  No answer/busy   Lenard Forth [place]  [facility], [facility] Guide, Phone: [phone] Website: [email or web address]

## 2023-10-13 NOTE — Progress Notes (Signed)
Transition Care Management Unsuccessful Follow-up Telephone Call  Date of discharge and from where:  [facility]  Attempts:  2nd Attempt  Reason for unsuccessful TCM follow-up call:  No answer/busy   Lenard Forth [place]  [facility], [facility] Guide, Phone: [phone] Website: [email or web address]

## 2023-10-21 ENCOUNTER — Telehealth: Payer: Self-pay | Admitting: Pharmacy Technician

## 2023-10-21 ENCOUNTER — Other Ambulatory Visit (HOSPITAL_COMMUNITY): Payer: Self-pay

## 2023-10-21 ENCOUNTER — Telehealth: Payer: Self-pay | Admitting: Cardiology

## 2023-10-21 NOTE — Telephone Encounter (Signed)
Pt states they received a call from a nurse and are calling back. Please advise

## 2023-10-21 NOTE — Telephone Encounter (Signed)
Called and spoke to patient's son. Informed him of message below from PA . No questions at this time.  Clinton Sawyer, [place] D, CPhT  I will retime this for next week. As of right now it goes through with too soon to refill and does not need a PA. I will try again next week in the new year to be certain. Patient should have enough until the end of Jan

## 2023-10-21 NOTE — Telephone Encounter (Signed)
Pt c/o medication issue:  1. Name of Medication: captopril (CAPOTEN) 25 MG tablet   2. How are you currently taking this medication (dosage and times per day)? Take 1 tablet (25 mg total) by mouth daily.   3. Are you having a reaction (difficulty breathing--STAT)?No  4. What is your medication issue?Patient's son is calling because the insurance is requiring the patient to change this medication. Patient's would like to know if they can get a note from the provider stating that she needs this medication. Please advise.

## 2023-10-21 NOTE — Telephone Encounter (Signed)
Called and spoke to patient's son who is call about patient's Captopril. He stated patient received a letter from her insurance stating they longer want to cover the medication. He states patient has been on this medication for over 20 years. Sending to PA team to see if medication needs PA to continue,

## 2023-10-21 NOTE — Telephone Encounter (Signed)
Pharmacy Patient Advocate Encounter   Received notification from Pt Calls Messages that prior authorization for captopril is required/requested.   Insurance verification completed.   The patient is insured through [employer] .   Per test claim: Refill too soon. PA is not needed at this time. Medication was filled 09/09/23. Next eligible fill date is 11/23/23.   PA says available without authorization

## 2023-10-27 ENCOUNTER — Other Ambulatory Visit (HOSPITAL_COMMUNITY): Payer: Self-pay

## 2023-10-27 ENCOUNTER — Telehealth: Payer: Self-pay | Admitting: Pharmacy Technician

## 2023-10-27 NOTE — Telephone Encounter (Signed)
 Pharmacy Patient Advocate Encounter   Received notification from Pt Calls Messages that prior authorization for captopril  is required/requested.   Insurance verification completed.   The patient is insured through [employer] .   Per test claim: Refill too soon. PA is not needed at this time. Medication was filled 09/09/23. Next eligible fill date is 11/23/23.

## 2023-11-14 ENCOUNTER — Other Ambulatory Visit: Payer: Self-pay | Admitting: Cardiology

## 2023-11-14 DIAGNOSIS — I480 Paroxysmal atrial fibrillation: Secondary | ICD-10-CM

## 2023-11-14 NOTE — Telephone Encounter (Signed)
Prescription refill request for Xarelto received.  Indication:afib Last office visit:4/24 Weight:54.4  kg Age:78 Scr:0.68  9/24 CrCl:58.56  ml/min  Prescription refilled

## 2023-11-21 ENCOUNTER — Other Ambulatory Visit: Payer: Medicare Other

## 2023-11-21 DIAGNOSIS — R7303 Prediabetes: Secondary | ICD-10-CM

## 2023-11-21 DIAGNOSIS — I10 Essential (primary) hypertension: Secondary | ICD-10-CM

## 2023-11-21 DIAGNOSIS — E039 Hypothyroidism, unspecified: Secondary | ICD-10-CM

## 2023-11-21 DIAGNOSIS — E782 Mixed hyperlipidemia: Secondary | ICD-10-CM

## 2023-11-21 LAB — BAYER DCA HB A1C WAIVED: HB A1C (BAYER DCA - WAIVED): 5.5 % (ref 4.8–5.6)

## 2023-11-22 LAB — CMP14+EGFR
ALT: 9 [IU]/L (ref 0–32)
AST: 25 [IU]/L (ref 0–40)
Albumin: 4.5 g/dL (ref 3.8–4.8)
Alkaline Phosphatase: 140 [IU]/L — ABNORMAL HIGH (ref 44–121)
BUN/Creatinine Ratio: 10 — ABNORMAL LOW (ref 12–28)
BUN: 6 mg/dL — ABNORMAL LOW (ref 8–27)
Bilirubin Total: 0.8 mg/dL (ref 0.0–1.2)
CO2: 19 mmol/L — ABNORMAL LOW (ref 20–29)
Calcium: 10.1 mg/dL (ref 8.7–10.3)
Chloride: 98 mmol/L (ref 96–106)
Creatinine, Ser: 0.6 mg/dL (ref 0.57–1.00)
Globulin, Total: 2.9 g/dL (ref 1.5–4.5)
Glucose: 92 mg/dL (ref 70–99)
Potassium: 4 mmol/L (ref 3.5–5.2)
Sodium: 136 mmol/L (ref 134–144)
Total Protein: 7.4 g/dL (ref 6.0–8.5)
eGFR: 92 mL/min/{1.73_m2} (ref >59)

## 2023-11-22 LAB — LIPID PANEL
Chol/HDL Ratio: 2.3 {ratio} (ref 0.0–4.4)
Cholesterol, Total: 135 mg/dL (ref 100–199)
HDL: 58 mg/dL (ref >39)
LDL Chol Calc (NIH): 62 mg/dL (ref 0–99)
Triglycerides: 75 mg/dL (ref 0–149)
VLDL Cholesterol Cal: 15 mg/dL (ref 5–40)

## 2023-11-22 LAB — TSH: TSH: 1.47 u[IU]/mL (ref 0.450–4.500)

## 2023-11-23 ENCOUNTER — Other Ambulatory Visit (HOSPITAL_COMMUNITY): Payer: Self-pay

## 2023-11-23 ENCOUNTER — Telehealth: Payer: Self-pay | Admitting: Pharmacy Technician

## 2023-11-23 NOTE — Telephone Encounter (Signed)
Pharmacy Patient Advocate Encounter   Received notification from Pt Calls Messages that prior authorization for captopril is required/requested.   Insurance verification completed.   The patient is insured through [employer] .   Per test claim: The current 11/23/23 day co-pay is, $1.60 -3 months.  No PA needed at this time. This test claim was processed through [facility]- copay amounts may vary at other pharmacies due to pharmacy/plan contracts, or as the patient moves through the different stages of their insurance plan.

## 2023-11-24 ENCOUNTER — Encounter: Payer: Self-pay | Admitting: Family Medicine

## 2023-11-24 ENCOUNTER — Ambulatory Visit: Payer: Medicare Other | Admitting: Family Medicine

## 2023-11-24 VITALS — BP 148/61 | Ht 64.0 in | Wt 127.0 lb

## 2023-11-24 DIAGNOSIS — J3089 Other allergic rhinitis: Secondary | ICD-10-CM

## 2023-11-24 DIAGNOSIS — E782 Mixed hyperlipidemia: Secondary | ICD-10-CM | POA: Diagnosis not present

## 2023-11-24 DIAGNOSIS — I10 Essential (primary) hypertension: Secondary | ICD-10-CM

## 2023-11-24 DIAGNOSIS — Z780 Asymptomatic menopausal state: Secondary | ICD-10-CM | POA: Diagnosis not present

## 2023-11-24 DIAGNOSIS — R062 Wheezing: Secondary | ICD-10-CM

## 2023-11-24 DIAGNOSIS — R7303 Prediabetes: Secondary | ICD-10-CM

## 2023-11-24 DIAGNOSIS — E039 Hypothyroidism, unspecified: Secondary | ICD-10-CM

## 2023-11-24 MED ORDER — AIRSUPRA 90-80 MCG/ACT IN AERO: 2.0000 | INHALATION_SPRAY | Freq: Four times a day (QID) | RESPIRATORY_TRACT | Status: DC | PRN

## 2023-11-24 MED ORDER — LORATADINE 10 MG PO TABS
10.0000 mg | ORAL_TABLET | Freq: Every day | ORAL | 3 refills | Status: DC
Start: 2023-11-24 — End: 2024-02-22

## 2023-11-24 NOTE — Progress Notes (Signed)
BP (!) 148/61   Ht 5\' 4"  (1.626 m)   Wt 127 lb (57.6 kg)   SpO2 99%   BMI 21.80 kg/m    Subjective:   Patient ID: April Duke, female    DOB: 05/16/1946, 78 y.o.   MRN: [id number]  HPI: April Duke is a 78 y.o. female presenting on 11/24/2023 for Medical Management of Chronic Issues, Hypertension, Hypothyroidism, and Prediabetes  Prediabetes Patient comes in today for recheck of her prediabetes, which is diet-controlled. Patient denies any vision changes or new issues with her feet. Her prediabetes is complicated by hyperlipidemia.   Hypertension and A-fib Patient is currently taking amlodipine, captopril, atenolol, and Xarelto. Her blood pressure today is 148/61. Patient has been unable to monitor her blood pressure at home because her machine is broken. She denies lightheadedness or dizziness, headaches, vision changes, chest pain, or shortness of breath.    Hyperlipidemia Patient is currently taking rosuvastatin. She denies myalgias or weakness. She does not have a history of liver damage from it.   Hypothyroidism recheck Patient is currently taking levothyroxine 50 mcg. She denies any hair or skin changes, heat or cold intolerance, diarrhea or constipation, and chest pain.  Patient also reports a 2-day history of nasal pruritus, sneezing, and cough. She has a history of environmental allergies and reports weather changes cause her allergies to flare. She has not tried any over the counter allergy medications. She denies any fever, chills, myalgias, sick contacts, or eye itching. She does get palpitations during the coughing spells.  Relevant past medical, surgical, family and social history reviewed and updated as indicated. Interim medical history since our last visit reviewed. Allergies and medications reviewed and updated.  Review of Systems  Constitutional:  Negative for chills and fever.  HENT:  Positive for sneezing. Negative for congestion, nosebleeds, rhinorrhea, sinus  pain and sore throat.   Eyes:  Negative for itching and visual disturbance.  Respiratory:  Positive for cough and wheezing. Negative for chest tightness and shortness of breath.   Cardiovascular:  Positive for palpitations. Negative for chest pain and leg swelling.  Gastrointestinal:  Negative for abdominal distention, abdominal pain, constipation and diarrhea.  Endocrine: Negative for cold intolerance and heat intolerance.  Genitourinary:  Negative for dysuria, hematuria and urgency.  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 Negative for myalgias.  Skin:  Negative for rash and wound.  Allergic/Immunologic: Positive for environmental allergies.  Neurological:  Negative for dizziness, syncope, weakness, light-headedness, numbness and headaches.  Hematological:  Does not bruise/bleed easily.    Per HPI unless specifically indicated above   Allergies as of 11/24/2023       Reactions   Alcohol Palpitations      Sulfa Antibiotics Rash   Ethanol    RAPID HEARTRATE         Medication List        Accurate as of November 24, 2023 11:40 AM. If you have any questions, ask your nurse or doctor.          acetaminophen 500 MG tablet Commonly known as: TYLENOL Take 2 tablets (1,000 mg total) by mouth every 6 (six) hours. What changed:  when to take this reasons to take this   Airsupra 90-80 MCG/ACT Aero Generic drug: Albuterol-Budesonide Inhale 2 Inhalations into the lungs every 6 (six) hours as needed. Started by: Elige Radon Amear Strojny   Paulene Floor 90-80 MCG/ACT Aero Generic drug: Albuterol-Budesonide Inhale 2 Inhalations into the lungs every 6 (six) hours  as needed. Started by: Elige Radon Mikell Kazlauskas   amLODipine 5 MG tablet Commonly known as: NORVASC Take 1 tablet (5 mg total) by mouth daily.   atenolol 50 MG tablet Commonly known as: TENORMIN Take 1 tablet (50 mg total) by mouth daily.   calcium carbonate 1500 (600 Ca) MG Tabs  tablet Commonly known as: OSCAL Take by mouth 2 (two) times daily with a meal.   captopril 25 MG tablet Commonly known as: CAPOTEN Take 1 tablet (25 mg total) by mouth daily.   diclofenac Sodium 1 % Gel Commonly known as: Voltaren Apply 2 g topically 4 (four) times daily.   loratadine 10 MG tablet Commonly known as: CLARITIN Take 1 tablet (10 mg total) by mouth daily. Started by: Elige Radon Parish Dubose   methocarbamol 500 MG tablet Commonly known as: ROBAXIN Take 1 tablet (500 mg total) by mouth every 6 (six) hours as needed for muscle spasms.   multivitamin tablet Take 1 tablet by mouth daily.   polyethylene glycol 17 g packet Commonly known as: MIRALAX / GLYCOLAX Take 17 g by mouth 2 (two) times daily.   rosuvastatin 5 MG tablet Commonly known as: CRESTOR Take 1 tablet (5 mg total) by mouth daily.   Synthroid 50 MCG tablet Generic drug: levothyroxine Take 1 tablet (50 mcg total) by mouth daily.   traMADol 50 MG tablet Commonly known as: ULTRAM Take 1-2 tablets (50-100 mg total) by mouth every 6 (six) hours as needed for severe pain.   Xarelto 20 MG Tabs tablet Generic drug: rivaroxaban TAKE 1 TABLET BY MOUTH ONCE DAILY WITH SUPPER         Objective:   BP (!) 148/61   Ht 5\' 4"  (1.626 m)   Wt 127 lb (57.6 kg)   SpO2 99%   BMI 21.80 kg/m   Wt Readings from Last 3 Encounters:  11/24/23 127 lb (57.6 kg)  09/18/23 120 lb (54.4 kg)  08/30/23 126 lb (57.2 kg)    Physical Exam Vitals and nursing note reviewed.  Constitutional:      Appearance: Normal appearance. She is normal weight.  HENT:     Head: Normocephalic and atraumatic.     Right Ear: External ear normal.     Left Ear: External ear normal.     Nose: Congestion present.     Mouth/Throat:     Mouth: Mucous membranes are moist.     Pharynx: Oropharynx is clear.  Eyes:     Conjunctiva/sclera: Conjunctivae normal.  Cardiovascular:     Rate and Rhythm: Regular rhythm. Bradycardia present.      Heart sounds: Normal heart sounds.  Pulmonary:     Effort: Pulmonary effort is normal.     Breath sounds: Wheezing present. No rales.  Abdominal:     General: Abdomen is flat. There is no distension.     Palpations: Abdomen is soft.     Tenderness: There is no abdominal tenderness. There is no right CVA tenderness or left CVA tenderness.  Musculoskeletal:     Cervical back: Normal range of motion and neck supple. No tenderness.     Right lower leg: No edema.     Left lower leg: No edema.  Skin:    General: Skin is warm and dry.     Findings: Lesion present.     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  Psychiatric:  Mood and Affect: Mood normal.        Behavior: Behavior normal.        Thought Content: Thought content normal.        Judgment: Judgment normal.     Results for orders placed or performed in visit on 11/21/23  Bayer DCA Hb A1c Waived   Collection Time: 11/21/23  2:32 PM  Result Value Ref Range   HB A1C (BAYER DCA - WAIVED) 5.5 4.8 - 5.6 %  TSH   Collection Time: 11/21/23  2:34 PM  Result Value Ref Range   TSH 1.470 0.450 - 4.500 uIU/mL  Lipid panel   Collection Time: 11/21/23  2:34 PM  Result Value Ref Range   Cholesterol, Total 135 100 - 199 mg/dL   Triglycerides 75 0 - 149 mg/dL   HDL 58 >16 mg/dL   VLDL Cholesterol Cal 15 5 - 40 mg/dL   LDL Chol Calc (NIH) 62 0 - 99 mg/dL   Chol/HDL Ratio 2.3 0.0 - 4.4 ratio  CMP14+EGFR   Collection Time: 11/21/23  2:34 PM  Result Value Ref Range   Glucose 92 70 - 99 mg/dL   BUN 6 (L) 8 - 27 mg/dL   Creatinine, Ser 1.09 0.57 - 1.00 mg/dL   eGFR 92 >60 AV/WUJ/8.11   BUN/Creatinine Ratio 10 (L) 12 - 28   Sodium 136 134 - 144 mmol/L   Potassium 4.0 3.5 - 5.2 mmol/L   Chloride 98 96 - 106 mmol/L   CO2 19 (L) 20 - 29 mmol/L   Calcium 10.1 8.7 - 10.3 mg/dL   Total Protein 7.4 6.0 - 8.5 g/dL   Albumin 4.5 3.8 - 4.8  g/dL   Globulin, Total 2.9 1.5 - 4.5 g/dL   Bilirubin Total 0.8 0.0 - 1.2 mg/dL   Alkaline Phosphatase 140 (H) 44 - 121 IU/L   AST 25 0 - 40 IU/L   ALT 9 0 - 32 IU/L    Assessment & Plan:   Problem List Items Addressed This Visit       Cardiovascular and Mediastinum   HTN (hypertension)   Relevant Orders   CBC with Differential/Platelet     Endocrine   Hypothyroid - Primary   Relevant Orders   TSH     Other   Hyperlipemia   Relevant Orders   CMP14+EGFR   Prediabetes   Relevant Orders   Bayer DCA Hb A1c Waived   Other Visit Diagnoses       Postmenopausal       Relevant Orders   DG [facility] DEXA     Seasonal allergic rhinitis due to other allergic trigger       Relevant Medications   loratadine (CLARITIN) 10 MG tablet     Wheezing           Blood pressure elevated at 148/61. Provided sample blood pressure monitor to patient, and recommended she take her blood pressure once daily for 2 weeks and notify us of the numbers. Prediabetes stable with HgbA1c 5.5%. Hyperlipidemia is well-controlled with LDL 62. Hypothyroidism controlled with TSH 1.470. Suspect sneezing, cough, nasal pruritus, and wheezing are from a flare of her environmental allergies. Will start loratadine once daily. Will also start Airsupra 2 puffs every 6 hours as needed (sample provided). Recommended patient schedule appointment for keratoacanthoma removal.  Follow up plan: Return if symptoms worsen or fail to improve, for Prediabetes-hld and hypothyroidism 3 months, also skin lesion removal, 30 min in 1 to 2 months.  Counseling  provided for all of the vaccine components Orders Placed This Encounter  Procedures   DG [facility] DEXA   CBC with Differential/Platelet   Bayer DCA Hb A1c Waived   CMP14+EGFR   TSH    Gillermina Phy, Medical Student [facility] 11/24/2023, 11:40 AM  I was personally present for all components of the history, physical exam and/or medical decision making.   I agree with the documentation performed by the student and agree with assessment and plan above.  Arville Care, MD [facility] 11/25/2023, 12:48 PM

## 2023-11-28 ENCOUNTER — Telehealth (INDEPENDENT_AMBULATORY_CARE_PROVIDER_SITE_OTHER): Payer: Medicare Other | Admitting: Family Medicine

## 2023-11-28 ENCOUNTER — Ambulatory Visit: Payer: Self-pay | Admitting: Family Medicine

## 2023-11-28 NOTE — Telephone Encounter (Signed)
Yes Tussin DM or Coricidin would be fine for her.  She could also take NyQuil if she wanted.

## 2023-11-28 NOTE — Telephone Encounter (Signed)
This RN attempted to contact the patient for triage, no answer, unable to leave voicemail.  Copied from CRM [id number]. Topic: Clinical - Medical Advice >> Nov 28, 2023 11:10 AM Fuller Mandril wrote: Reason for CRM: Patient son called wanted to know if something could be sent in to [facility] [address] [place] - [address] for cough and congestion. Was recently seen on 1/30. This started right after. Thank You

## 2023-11-28 NOTE — Telephone Encounter (Signed)
What OTC cough medications do you recommend?  Tussin DM?

## 2023-11-28 NOTE — Progress Notes (Signed)
Called patient and spoke with her husband and he said that she was not home currently but that he would let me know if she needed the visit and call back, I know we answered some questions for on MyChart earlier.  If she needs anything further then please let me know

## 2023-11-28 NOTE — Telephone Encounter (Signed)
  Chief Complaint: cough Symptoms: cough, congestion, runny nose, wheezing Frequency: 6 days Pertinent Negatives: Patient denies SOB at rest, CP, fever Disposition: [] ED /[x] Urgent Care (no appt availability in office) / [] Appointment(In office/virtual)/ []  [facility]/ [] Home Care/ [x] Refused Recommended Disposition /[] [facility] [place] [facility]/ []  Follow-up with PCP Additional Notes: Patient calls reporting cough, congestion, runny nose x 6 days. States she feels she has wheezing as well. Denies fever, CP, SOB with rest. Speaking in full sentences. Per protocol, patient to be evaluated within 4 hours. No availability with any provider in clinic within that time frame. Patient declines urgent care. Patient is asking what medications she can take OTC for her cough with hx of afib. Requesting a call back from PCP. Care advice reviewed, alerting PCP for follow up.   Reason for Disposition  Wheezing is present  Answer Assessment - Initial Assessment Questions 1. ONSET: "When did the cough begin?"      6 days 2. SEVERITY: "How bad is the cough today?"      Real bad several times, once I start it is hard to stop 3. SPUTUM: "Describe the color of your sputum" (none, dry cough; clear, white, yellow, green)     clear 4. HEMOPTYSIS: "Are you coughing up any blood?" If so ask: "How much?" (flecks, streaks, tablespoons, etc.)     Denies 5. DIFFICULTY BREATHING: "Are you having difficulty breathing?" If Yes, ask: "How bad is it?" (e.g., mild, moderate, severe)    - MILD: No SOB at rest, mild SOB with walking, speaks normally in sentences, can lie down, no retractions, pulse < 100.    - MODERATE: SOB at rest, SOB with minimal exertion and prefers to sit, cannot lie down flat, speaks in phrases, mild retractions, audible wheezing, pulse 100-120.    - SEVERE: Very SOB at rest, speaks in single words, struggling to breathe, sitting hunched forward, retractions, pulse > 120      Mild 6. FEVER: "Do  you have a fever?" If Yes, ask: "What is your temperature, how was it measured, and when did it start?"     Denies 7. CARDIAC HISTORY: "Do you have any history of heart disease?" (e.g., heart attack, congestive heart failure)      Afib 8. LUNG HISTORY: "Do you have any history of lung disease?"  (e.g., pulmonary embolus, asthma, emphysema)     Denies 9. PE RISK FACTORS: "Do you have a history of blood clots?" (or: recent major surgery, recent prolonged travel, bedridden)     Denies 10. OTHER SYMPTOMS: "Do you have any other symptoms?" (e.g., runny nose, wheezing, chest pain)       Wheezing, runny nose  12. TRAVEL: "Have you traveled out of the country in the last month?" (e.g., travel history, exposures)       Denies  Protocols used: Cough - Acute Productive-A-AH

## 2023-11-28 NOTE — Telephone Encounter (Signed)
Pt made aware and understood. Will call back if needed.

## 2023-12-01 ENCOUNTER — Encounter: Payer: Self-pay | Admitting: Family

## 2023-12-01 ENCOUNTER — Ambulatory Visit: Payer: Medicare Other

## 2023-12-01 VITALS — BP 192/71 | HR 57 | Temp 96.6°F | Wt 127.0 lb

## 2023-12-01 DIAGNOSIS — R300 Dysuria: Secondary | ICD-10-CM

## 2023-12-01 DIAGNOSIS — N3000 Acute cystitis without hematuria: Secondary | ICD-10-CM

## 2023-12-01 LAB — URINALYSIS, COMPLETE
Bilirubin, UA: NEGATIVE
Glucose, UA: NEGATIVE
Ketones, UA: NEGATIVE
Nitrite, UA: NEGATIVE
Protein,UA: NEGATIVE
Specific Gravity, UA: 1.01 (ref 1.005–1.030)
Urobilinogen, Ur: 0.2 mg/dL (ref 0.2–1.0)
pH, UA: 7 (ref 5.0–7.5)

## 2023-12-01 LAB — MICROSCOPIC EXAMINATION
Renal Epithel, UA: NONE SEEN /[HPF]
Yeast, UA: NONE SEEN

## 2023-12-01 MED ORDER — CEPHALEXIN 500 MG PO CAPS: 500.0000 mg | ORAL_CAPSULE | Freq: Two times a day (BID) | ORAL | 0 refills | Status: DC

## 2023-12-01 NOTE — Progress Notes (Signed)
 Subjective:    Patient ID: April Duke, female    DOB: 11-21-1945, 78 y.o.   MRN: [id number]  Chief Complaint  Patient presents with   Dysuria    X 3 days    Cough    X 1 week    Pt presents to the office today with dysuria for 3 days.  Dysuria  This is a new problem. The current episode started in the past 7 days. The problem occurs every urination. The problem has been gradually worsening. The quality of the pain is described as burning. The pain is mild. There has been no fever. Associated symptoms include frequency, hesitancy, nausea and urgency. Pertinent negatives include no flank pain, hematuria or vomiting. She has tried increased fluids for the symptoms. The treatment provided moderate relief.  Cough      Review of Systems  Respiratory:  Positive for cough.   Gastrointestinal:  Positive for nausea. Negative for vomiting.  Genitourinary:  Positive for dysuria, frequency, hesitancy and urgency. Negative for flank pain and hematuria.  All other systems reviewed and are negative.   Social History   Socioeconomic History   Marital status: Married    Spouse name: Not on file   Number of children: 3   Years of education: Not on file   Highest education level: Not on file  Occupational History   Occupation: retired  Tobacco Use   Smoking status: Never   Smokeless tobacco: Never  Vaping Use   Vaping status: Never Used  Substance and Sexual Activity   Alcohol use: Never   Drug use: Never   Sexual activity: Not on file    Comment: married 56 years in 2020  Other Topics Concern   Not on file  Social History Narrative   Lives with husband.  Has 3 sons.     Social Drivers of Corporate Investment Banker Strain: Low Risk  (08/30/2023)   Overall Financial Resource Strain (CARDIA)    Difficulty of Paying Living Expenses: Not hard at all  Food Insecurity: No Food Insecurity (08/30/2023)   Hunger Vital Sign    Worried About Running Out of Food in the Last Year: Never  true    Ran Out of Food in the Last Year: Never true  Transportation Needs: No Transportation Needs (08/30/2023)   PRAPARE - Administrator, Civil Service (Medical): No    Lack of Transportation (Non-Medical): No  Physical Activity: Inactive (08/30/2023)   Exercise Vital Sign    Days of Exercise per Week: 0 days    Minutes of Exercise per Session: 0 min  Stress: No Stress Concern Present (08/30/2023)   [employer] of Occupational Health - Occupational Stress Questionnaire    Feeling of Stress : Not at all  Social Connections: Moderately Integrated (08/30/2023)   Social Connection and Isolation Panel [NHANES]    Frequency of Communication with Friends and Family: More than three times a week    Frequency of Social Gatherings with Friends and Family: More than three times a week    Attends Religious Services: More than 4 times per year    Active Member of [employer] or Organizations: No    Attends Banker Meetings: Never    Marital Status: Married   Family History  Problem Relation Age of Onset   Cancer Mother        breast   Hypertension Mother    Hypertension Father    Pulmonary embolism Sister    Lung  cancer Brother    Hypertension Brother    Hyperlipidemia Brother         Objective:   Physical Exam Vitals reviewed.  Constitutional:      General: She is not in acute distress.    Appearance: She is well-developed.  HENT:     Head: Normocephalic and atraumatic.  Eyes:     Pupils: Pupils are equal, round, and reactive to light.  Neck:     Thyroid : No thyromegaly.  Cardiovascular:     Rate and Rhythm: Normal rate and regular rhythm.     Heart sounds: Normal heart sounds. No murmur heard. Pulmonary:     Effort: Pulmonary effort is normal. No respiratory distress.     Breath sounds: Normal breath sounds. No wheezing.  Abdominal:     General: Bowel sounds are normal. There is no distension.     Palpations: Abdomen is soft.     Tenderness: There  is no abdominal tenderness.  Musculoskeletal:     Cervical back: Normal range of motion and neck supple.  Skin:    General: Skin is warm and dry.  Neurological:     Mental Status: She is alert and oriented to person, place, and time.     Cranial Nerves: No cranial nerve deficit.     Deep Tendon Reflexes: Reflexes are normal and symmetric.  Psychiatric:        Behavior: Behavior normal.        Thought Content: Thought content normal.        Judgment: Judgment normal.       BP (!) 192/71   Pulse (!) 57   Temp (!) 96.6 F (35.9 C) (Temporal)   Wt 127 lb (57.6 kg)   SpO2 93%   BMI 21.80 kg/m      Assessment & Plan:  April Duke comes in today with chief complaint of Dysuria (X 3 days ) and Cough (X 1 week )   Diagnosis and orders addressed:  1. Dysuria (Primary) - Urinalysis, Complete - Urine Culture  2. Acute cystitis without hematuria Force fluids AZO over the counter X2 days RTO prn Culture pending Start Keflex  BID - cephALEXin  (KEFLEX ) 500 MG capsule; Take 1 capsule (500 mg total) by mouth 2 (two) times daily.  Dispense: 14 capsule; Refill: 0     Bari Learn, FNP

## 2023-12-01 NOTE — Patient Instructions (Signed)

## 2023-12-03 LAB — URINE CULTURE

## 2023-12-12 ENCOUNTER — Other Ambulatory Visit: Payer: Self-pay | Admitting: Family

## 2023-12-12 DIAGNOSIS — N3000 Acute cystitis without hematuria: Secondary | ICD-10-CM

## 2023-12-12 NOTE — Telephone Encounter (Unsigned)
Copied from CRM [id number]. Topic: Clinical - Medication Refill >> Dec 12, 2023 11:08 AM Fuller Mandril wrote: Most Recent Primary Care Visit:  Provider: Jannifer Rodney A  Department: [facility]  Visit Type: ACUTE  Date: 12/01/2023  Medication: cephALEXin (KEFLEX) 500 MG capsule; Take 1 capsule (500 mg total) by mouth 2 (two) times daily - still having some back pain   Has the patient contacted their pharmacy? No (Agent: If no, request that the patient contact the pharmacy for the refill. If patient does not wish to contact the pharmacy document the reason why and proceed with request.) (Agent: If yes, when and what did the pharmacy advise?)  Is this the correct pharmacy for this prescription? Yes If no, delete pharmacy and type the correct one.  This is the patient's preferred pharmacy:  [facility] [address], [place] - [address] [place] [address] Phone: [phone] Fax: [phone]  Has the prescription been filled recently? Yes  Is the patient out of the medication? Yes  Has the patient been seen for an appointment in the last year OR does the patient have an upcoming appointment? Yes  Can we respond through MyChart? No - call son Thereasa Distance   Agent: Please be advised that Rx refills may take up to 3 business days. We ask that you follow-up with your pharmacy.

## 2023-12-13 ENCOUNTER — Ambulatory Visit
Admission: EM | Admit: 2023-12-13 | Discharge: 2023-12-13 | Disposition: A | Discharge status: Home / Self Care | Payer: Medicare Other | Attending: Family Medicine | Admitting: Family Medicine

## 2023-12-13 ENCOUNTER — Telehealth: Payer: Self-pay

## 2023-12-13 ENCOUNTER — Encounter: Payer: Self-pay | Admitting: Emergency Medicine

## 2023-12-13 ENCOUNTER — Other Ambulatory Visit: Payer: Self-pay

## 2023-12-13 DIAGNOSIS — N390 Urinary tract infection, site not specified: Secondary | ICD-10-CM | POA: Diagnosis not present

## 2023-12-13 DIAGNOSIS — R030 Elevated blood-pressure reading, without diagnosis of hypertension: Secondary | ICD-10-CM | POA: Insufficient documentation

## 2023-12-13 DIAGNOSIS — R109 Unspecified abdominal pain: Secondary | ICD-10-CM | POA: Insufficient documentation

## 2023-12-13 LAB — POCT URINALYSIS DIP (MANUAL ENTRY)
Bilirubin, UA: NEGATIVE
Glucose, UA: NEGATIVE mg/dL
Ketones, POC UA: NEGATIVE mg/dL
Nitrite, UA: NEGATIVE
Protein Ur, POC: NEGATIVE mg/dL
Spec Grav, UA: 1.015 (ref 1.010–1.025)
Urobilinogen, UA: 0.2 U/dL
pH, UA: 7 (ref 5.0–8.0)

## 2023-12-13 MED ORDER — CEPHALEXIN 500 MG PO CAPS: 500.0000 mg | ORAL_CAPSULE | Freq: Two times a day (BID) | ORAL | 0 refills | Status: DC

## 2023-12-13 NOTE — Discharge Instructions (Signed)
You appear to have a urinary tract infection today.  We have sent out for urine culture and we will let you know if we need to make any changes to your medication but in the meantime I have sent over an antibiotic to start treating this.  Drink plenty of fluids and follow-up if your symptoms significantly worsen

## 2023-12-13 NOTE — ED Triage Notes (Signed)
Pt reports lower back pain, and intermittent abd pain, urinary frequency for last several days.

## 2023-12-13 NOTE — Telephone Encounter (Signed)
Copied from CRM [id number]. Topic: Clinical - Prescription Issue >> Dec 13, 2023  3:34 PM Gaetano Hawthorne wrote: Reason for CRM: Patient's spouse was checking on prescription refill for Keflex - they were hoping to pick it up from the pharmacy this afternoon with the weather coming up. Please call patient when you have a moment to confirm.

## 2023-12-14 LAB — URINE CULTURE

## 2023-12-15 NOTE — Telephone Encounter (Signed)
No futher action needed. Pt has picked up meds. LS

## 2023-12-15 NOTE — Telephone Encounter (Signed)
Keflex was sent in the day of her appointment. I see she went to Urgent Care and Keflex was resent. Please see if there is anything else patient needs.

## 2023-12-17 NOTE — ED Provider Notes (Signed)
 [facility]    CSN: [id number] Arrival date & time: 12/13/23  1757      History   Chief Complaint Chief Complaint  Patient presents with   Back Pain    HPI April Duke is a 78 y.o. female.   Patient presenting today with several day history of urinary frequency, lower abdominal pain, lower back pain.  Denies fever, chills, hematuria, significant dysuria, vomiting, diarrhea.  So far not tried anything over-the-counter for symptoms.    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   S/P total right hip arthroplasty 11/23/2022   Prediabetes 09/04/2021   Arthritis of right hip 01/30/2021   A-fib (HCC) 04/27/2019   HTN (hypertension) 04/27/2019   Hyperlipemia 04/27/2019   Hypothyroid 04/27/2019   Osteoporosis 04/27/2019   GERD (gastroesophageal reflux disease) 07/02/2014    Past Surgical History:  Procedure Laterality Date   CARDIOVERSION     TOTAL HIP ARTHROPLASTY Right 11/23/2022   Procedure: TOTAL HIP ARTHROPLASTY ANTERIOR APPROACH;  Surgeon: Durene Romans, MD;  Location: [facility];  Service: Orthopedics;  Laterality: Right;   TOTAL HIP ARTHROPLASTY Left 05/17/2023   Procedure: TOTAL HIP ARTHROPLASTY ANTERIOR APPROACH;  Surgeon: Durene Romans, MD;  Location: [facility];  Service: Orthopedics;  Laterality: Left;    OB History   No obstetric history on file.      Home Medications    Prior to Admission medications   Medication Sig Start Date End Date Taking? Authorizing Provider  cephALEXin (KEFLEX) 500 MG capsule Take 1 capsule (500 mg total) by mouth 2 (two) times daily. 12/13/23  Yes Particia Nearing, PA-C  acetaminophen (TYLENOL) 500 MG tablet Take 2 tablets (1,000 mg total) by mouth every 6 (six) hours. Patient taking  differently: Take 1,000 mg by mouth every 6 (six) hours as needed for mild pain (pain score 1-3) or moderate pain (pain score 4-6). 11/24/22   Cassandria Anger, PA-C  Albuterol-Budesonide (AIRSUPRA) 90-80 MCG/ACT AERO Inhale 2 Inhalations into the lungs every 6 (six) hours as needed. 11/24/23   Dettinger, Elige Radon, MD  Albuterol-Budesonide (AIRSUPRA) 90-80 MCG/ACT AERO Inhale 2 Inhalations into the lungs every 6 (six) hours as needed. 11/24/23   Dettinger, Elige Radon, MD  amLODipine (NORVASC) 5 MG tablet Take 1 tablet (5 mg total) by mouth daily. 07/25/23   Dettinger, Elige Radon, MD  atenolol (TENORMIN) 50 MG tablet Take 1 tablet (50 mg total) by mouth daily. 02/17/23   Rollene Rotunda, MD  calcium carbonate (OSCAL) 1500 (600 Ca) MG TABS tablet Take by mouth 2 (two) times daily with a meal.    [provider]  captopril (CAPOTEN) 25 MG tablet Take 1 tablet (25 mg total) by mouth daily. 02/17/23   Rollene Rotunda, MD  cephALEXin (KEFLEX) 500 MG capsule Take 1 capsule (500 mg total) by mouth 2 (two) times daily. 12/01/23   Jannifer Rodney A, FNP  diclofenac Sodium (VOLTAREN) 1 % GEL Apply 2 g topically 4 (four) times daily. 09/06/23   Particia Nearing, PA-C  loratadine (CLARITIN) 10 MG tablet Take 1 tablet (10 mg total) by mouth daily. 11/24/23   Dettinger, Elige Radon, MD  methocarbamol (ROBAXIN) 500 MG tablet Take 1 tablet (500 mg total)  by mouth every 6 (six) hours as needed for muscle spasms. 05/18/23   Cassandria Anger, PA-C  Multiple Vitamin (MULTIVITAMIN) tablet Take 1 tablet by mouth daily.    [provider]  polyethylene glycol (MIRALAX / GLYCOLAX) 17 g packet Take 17 g by mouth 2 (two) times daily. 05/18/23   Cassandria Anger, PA-C  rosuvastatin (CRESTOR) 5 MG tablet Take 1 tablet (5 mg total) by mouth daily. 07/25/23   Dettinger, Elige Radon, MD  SYNTHROID 50 MCG tablet Take 1 tablet (50 mcg total) by mouth daily. 07/25/23   Dettinger, Elige Radon, MD  traMADol (ULTRAM) 50 MG tablet Take 1-2  tablets (50-100 mg total) by mouth every 6 (six) hours as needed for severe pain. 05/18/23   Cassandria Anger, PA-C  XARELTO 20 MG TABS tablet TAKE 1 TABLET BY MOUTH ONCE DAILY WITH SUPPER 11/14/23   Rollene Rotunda, MD    Family History Family History  Problem Relation Age of Onset   Cancer Mother        breast   Hypertension Mother    Hypertension Father    Pulmonary embolism Sister    Lung cancer Brother    Hypertension Brother    Hyperlipidemia Brother     Social History Social History   Tobacco Use   Smoking status: Never   Smokeless tobacco: Never  Vaping Use   Vaping status: Never Used  Substance Use Topics   Alcohol use: Never   Drug use: Never     Allergies   Alcohol, Sulfa antibiotics, and Ethanol   Review of Systems Review of Systems Per HPI  Physical Exam Triage Vital Signs ED Triage Vitals  Encounter Vitals Group     BP 12/13/23 1914 (!) 170/81     Systolic BP Percentile --      Diastolic BP Percentile --      Pulse Rate 12/13/23 1914 (!) 56     Resp 12/13/23 1914 14     Temp 12/13/23 1914 97.7 F (36.5 C)     Temp Source 12/13/23 1914 Oral     SpO2 12/13/23 1914 97 %     Weight --      Height --      Head Circumference --      Peak Flow --      Pain Score 12/13/23 1920 8     Pain Loc --      Pain Education --      Exclude from Growth Chart --    No data found.  Updated Vital Signs BP (!) 170/81 (BP Location: Right Arm)   Pulse (!) 56   Temp 97.7 F (36.5 C) (Oral)   Resp 14   SpO2 97%   Visual Acuity Right Eye Distance:   Left Eye Distance:   Bilateral Distance:    Right Eye Near:   Left Eye Near:    Bilateral Near:     Physical Exam Vitals and nursing note reviewed.  Constitutional:      Appearance: Normal appearance. She is not ill-appearing.  HENT:     Head: Atraumatic.  Eyes:     Extraocular Movements: Extraocular movements intact.     Conjunctiva/sclera: Conjunctivae normal.  Cardiovascular:     Rate and  Rhythm: Normal rate and regular rhythm.     Heart sounds: Normal heart sounds.  Pulmonary:     Effort: Pulmonary effort is normal.     Breath sounds: Normal breath sounds.  Abdominal:  General: Bowel sounds are normal. There is no distension.     Palpations: Abdomen is soft.     Tenderness: There is no abdominal tenderness. There is no guarding.  Musculoskeletal:        General: Normal range of motion.     Cervical back: Normal range of motion and neck supple.  Skin:    General: Skin is warm and dry.  Neurological:     Mental Status: She is alert and oriented to person, place, and time.  Psychiatric:        Mood and Affect: Mood normal.        Thought Content: Thought content normal.        Judgment: Judgment normal.      UC Treatments / Results  Labs (all labs ordered are listed, but only abnormal results are displayed) Labs Reviewed  URINE CULTURE - Abnormal; Notable for the following components:      Result Value   Culture MULTIPLE SPECIES PRESENT, SUGGEST RECOLLECTION (*)    All other components within normal limits  POCT URINALYSIS DIP (MANUAL ENTRY) - Abnormal; Notable for the following components:   Clarity, UA cloudy (*)    Blood, UA trace-lysed (*)    Leukocytes, UA Small (1+) (*)    All other components within normal limits    EKG   Radiology No results found.  Procedures Procedures (including critical care time)  Medications Ordered in UC Medications - No data to display  Initial Impression / Assessment and Plan / UC Course  I have reviewed the triage vital signs and the nursing notes.  Pertinent labs & imaging results that were available during my care of the patient were reviewed by me and considered in my medical decision making (see chart for details).     Hypertensive in triage, otherwise vital signs reassuring.  Discussed to continue monitoring this at home and continued compliance with medication regimen.  Follow-up with PCP for recheck  of this.  Urinalysis with evidence of a UTI.  Treat with Keflex and await urine culture and adjust if needed.  Final Clinical Impressions(s) / UC Diagnoses   Final diagnoses:  Acute lower UTI  Right flank pain  Elevated blood pressure reading     Discharge Instructions      You appear to have a urinary tract infection today.  We have sent out for urine culture and we will let you know if we need to make any changes to your medication but in the meantime I have sent over an antibiotic to start treating this.  Drink plenty of fluids and follow-up if your symptoms significantly worsen    ED Prescriptions     Medication Sig Dispense Auth. Provider   cephALEXin (KEFLEX) 500 MG capsule Take 1 capsule (500 mg total) by mouth 2 (two) times daily. 10 capsule Particia Nearing, [place]      PDMP not reviewed this encounter.   Roosvelt Maser [place], [place] 12/17/23 [phone]

## 2023-12-20 ENCOUNTER — Ambulatory Visit
Admission: RE | Admit: 2023-12-20 | Discharge: 2023-12-20 | Disposition: A | Discharge status: Home / Self Care | Payer: Medicare Other | Source: Ambulatory Visit | Attending: Nurse Practitioner | Admitting: Nurse Practitioner

## 2023-12-20 VITALS — BP 140/52 | HR 52 | Temp 97.9°F | Resp 16

## 2023-12-20 DIAGNOSIS — R109 Unspecified abdominal pain: Secondary | ICD-10-CM | POA: Diagnosis not present

## 2023-12-20 DIAGNOSIS — R8290 Unspecified abnormal findings in urine: Secondary | ICD-10-CM | POA: Insufficient documentation

## 2023-12-20 LAB — POCT URINALYSIS DIP (MANUAL ENTRY)
Bilirubin, UA: NEGATIVE
Glucose, UA: NEGATIVE mg/dL
Ketones, POC UA: NEGATIVE mg/dL
Nitrite, UA: NEGATIVE
Spec Grav, UA: 1.015 (ref 1.010–1.025)
Urobilinogen, UA: 0.2 U/dL
pH, UA: 6 (ref 5.0–8.0)

## 2023-12-20 NOTE — ED Triage Notes (Signed)
 Was seen on 2/18 for UTI.  Continues to have lower back pain.  States she was called to come back and have urine rechecked.

## 2023-12-20 NOTE — ED Provider Notes (Signed)
 [facility]    CSN: [id number] Arrival date & time: 12/20/23  1447      History   Chief Complaint No chief complaint on file.   HPI April Duke is a 78 y.o. female.   The history is provided by the patient.   Patient presents for follow-up for continued low back pain.  Patient was seen in this clinic on 12/13/2023.  Urinalysis was concerning for possible UTI.  Patient was started on Keflex 500 mg.  Patient has since stopped taking the Keflex.  Patient states despite taking the medication, symptoms fail to improve.  Patient continues to complain of pain in the right side of her low back at the flank and right kidney.  Continues to deny fever, chills, nausea, vomiting, pain with urination, hematuria, urgency, frequency, or vaginal symptoms.  Urine culture was performed, which was inconclusive, recollection was suggested. 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   S/P total right hip arthroplasty 11/23/2022   Prediabetes 09/04/2021   Arthritis of right hip 01/30/2021   A-fib (HCC) 04/27/2019   HTN (hypertension) 04/27/2019   Hyperlipemia 04/27/2019   Hypothyroid 04/27/2019   Osteoporosis 04/27/2019   GERD (gastroesophageal reflux disease) 07/02/2014    Past Surgical History:  Procedure Laterality Date   CARDIOVERSION     TOTAL HIP ARTHROPLASTY Right 11/23/2022   Procedure: TOTAL HIP ARTHROPLASTY ANTERIOR APPROACH;  Surgeon: Durene Romans, MD;  Location: [facility];  Service: Orthopedics;  Laterality: Right;   TOTAL HIP ARTHROPLASTY Left 05/17/2023   Procedure: TOTAL HIP ARTHROPLASTY ANTERIOR APPROACH;  Surgeon: Durene Romans, MD;  Location: [facility];  Service: Orthopedics;  Laterality: Left;    OB History   No obstetric history on file.       Home Medications    Prior to Admission medications   Medication Sig Start Date End Date Taking? Authorizing Provider  acetaminophen (TYLENOL) 500 MG tablet Take 2 tablets (1,000 mg total) by mouth every 6 (six) hours. Patient taking differently: Take 1,000 mg by mouth every 6 (six) hours as needed for mild pain (pain score 1-3) or moderate pain (pain score 4-6). 11/24/22   Cassandria Anger, PA-C  Albuterol-Budesonide (AIRSUPRA) 90-80 MCG/ACT AERO Inhale 2 Inhalations into the lungs every 6 (six) hours as needed. 11/24/23   Dettinger, Elige Radon, MD  Albuterol-Budesonide (AIRSUPRA) 90-80 MCG/ACT AERO Inhale 2 Inhalations into the lungs every 6 (six) hours as needed. 11/24/23   Dettinger, Elige Radon, MD  amLODipine (NORVASC) 5 MG tablet Take 1 tablet (5 mg total) by mouth daily. 07/25/23   Dettinger, Elige Radon, MD  atenolol (TENORMIN) 50 MG tablet Take 1 tablet (50 mg total) by mouth daily. 02/17/23   Rollene Rotunda, MD  calcium carbonate (OSCAL) 1500 (600 Ca) MG TABS tablet Take by mouth 2 (two) times daily with a meal.    [provider]  captopril (CAPOTEN) 25 MG tablet Take 1 tablet (25 mg total) by mouth daily. 02/17/23   Rollene Rotunda, MD  cephALEXin (KEFLEX) 500 MG capsule Take 1 capsule (500 mg total) by mouth 2 (two) times daily. 12/01/23   Jannifer Rodney A, FNP  cephALEXin (KEFLEX) 500 MG capsule Take 1 capsule (500 mg total) by mouth  2 (two) times daily. 12/13/23   Particia Nearing, PA-C  diclofenac Sodium (VOLTAREN) 1 % GEL Apply 2 g topically 4 (four) times daily. 09/06/23   Particia Nearing, PA-C  loratadine (CLARITIN) 10 MG tablet Take 1 tablet (10 mg total) by mouth daily. 11/24/23   Dettinger, Elige Radon, MD  methocarbamol (ROBAXIN) 500 MG tablet Take 1 tablet (500 mg total) by mouth every 6 (six) hours as needed for muscle spasms. 05/18/23   Cassandria Anger, PA-C  Multiple Vitamin (MULTIVITAMIN) tablet Take 1 tablet by mouth daily.    [provider]   polyethylene glycol (MIRALAX / GLYCOLAX) 17 g packet Take 17 g by mouth 2 (two) times daily. 05/18/23   Cassandria Anger, PA-C  rosuvastatin (CRESTOR) 5 MG tablet Take 1 tablet (5 mg total) by mouth daily. 07/25/23   Dettinger, Elige Radon, MD  SYNTHROID 50 MCG tablet Take 1 tablet (50 mcg total) by mouth daily. 07/25/23   Dettinger, Elige Radon, MD  traMADol (ULTRAM) 50 MG tablet Take 1-2 tablets (50-100 mg total) by mouth every 6 (six) hours as needed for severe pain. 05/18/23   Cassandria Anger, PA-C  XARELTO 20 MG TABS tablet TAKE 1 TABLET BY MOUTH ONCE DAILY WITH SUPPER 11/14/23   Rollene Rotunda, MD    Family History Family History  Problem Relation Age of Onset   Cancer Mother        breast   Hypertension Mother    Hypertension Father    Pulmonary embolism Sister    Lung cancer Brother    Hypertension Brother    Hyperlipidemia Brother     Social History Social History   Tobacco Use   Smoking status: Never   Smokeless tobacco: Never  Vaping Use   Vaping status: Never Used  Substance Use Topics   Alcohol use: Never   Drug use: Never     Allergies   Alcohol, Sulfa antibiotics, and Ethanol   Review of Systems Review of Systems Per HPI  Physical Exam Triage Vital Signs ED Triage Vitals  Encounter Vitals Group     BP 12/20/23 1510 (!) 140/52     Systolic BP Percentile --      Diastolic BP Percentile --      Pulse Rate 12/20/23 1510 (!) 52     Resp 12/20/23 1510 16     Temp 12/20/23 1510 97.9 F (36.6 C)     Temp Source 12/20/23 1510 Oral     SpO2 12/20/23 1510 94 %     Weight --      Height --      Head Circumference --      Peak Flow --      Pain Score 12/20/23 1513 7     Pain Loc --      Pain Education --      Exclude from Growth Chart --    No data found.  Updated Vital Signs BP (!) 140/52 (BP Location: Right Arm)   Pulse (!) 52   Temp 97.9 F (36.6 C) (Oral)   Resp 16   SpO2 94%   Visual Acuity Right Eye Distance:   Left Eye Distance:    Bilateral Distance:    Right Eye Near:   Left Eye Near:    Bilateral Near:     Physical Exam Vitals and nursing note reviewed.  Constitutional:      General: She is not in acute distress.    Appearance: Normal appearance.  HENT:  Head: Normocephalic.  Eyes:     Extraocular Movements: Extraocular movements intact.     Pupils: Pupils are equal, round, and reactive to light.  Cardiovascular:     Rate and Rhythm: Normal rate and regular rhythm.     Pulses: Normal pulses.     Heart sounds: Normal heart sounds.  Pulmonary:     Effort: Pulmonary effort is normal. No respiratory distress.     Breath sounds: Normal breath sounds. No stridor. No wheezing, rhonchi or rales.  Abdominal:     General: Bowel sounds are normal.     Palpations: Abdomen is soft.     Tenderness: There is right CVA tenderness.  Musculoskeletal:     Cervical back: Normal range of motion.  Skin:    General: Skin is warm and dry.  Neurological:     General: No focal deficit present.     Mental Status: She is alert and oriented to person, place, and time.  Psychiatric:        Mood and Affect: Mood normal.        Behavior: Behavior normal.      UC Treatments / Results  Labs (all labs ordered are listed, but only abnormal results are displayed) Labs Reviewed  POCT URINALYSIS DIP (MANUAL ENTRY) - Abnormal; Notable for the following components:      Result Value   Color, UA light yellow (*)    Blood, UA trace-intact (*)    Protein Ur, POC trace (*)    Leukocytes, UA Small (1+) (*)    All other components within normal limits  URINE CULTURE    EKG   Radiology No results found.  Procedures Procedures (including critical care time)  Medications Ordered in UC Medications - No data to display  Initial Impression / Assessment and Plan / UC Course  I have reviewed the triage vital signs and the nursing notes.  Pertinent labs & imaging results that were available during my care of the patient  were reviewed by me and considered in my medical decision making (see chart for details).  Urinalysis indicate an obvious UTI, urine culture is pending.  Discussion with patient and her son that it is recommended that treatment be reinitiated based on the culture results.  Supportive care recommendations were provided in the interim to include continuing to drink plenty of fluids, and Tylenol for pain or discomfort.  Advised patient and son that if urine culture is negative, it is recommended that patient follow-up with her PCP for further evaluation.  Patient and son were in agreement with this plan of care and verbalizes understanding.  All questions were answered.  Patient stable for discharge.  Final Clinical Impressions(s) / UC Diagnoses   Final diagnoses:  Right flank pain  Abnormal urinalysis     Discharge Instructions      Urine culture has been ordered.  You will be contacted if the urine culture results are abnormal.  You also access to the results via MyChart. May take over-the-counter Tylenol as needed for pain or discomfort. Continue to drink plenty of fluids.  Try to drink at least 5-7 8 ounce glasses of water daily. If the results of the culture are negative, and you are continuing to experience symptoms, you will need to follow-up with your primary care physician for further evaluation. Follow-up as needed.     ED Prescriptions   None    PDMP not reviewed this encounter.   Abran Cantor, NP 12/20/23 [phone]

## 2023-12-20 NOTE — Discharge Instructions (Addendum)
 Urine culture has been ordered.  You will be contacted if the urine culture results are abnormal.  You also access to the results via MyChart. May take over-the-counter Tylenol as needed for pain or discomfort. Continue to drink plenty of fluids.  Try to drink at least 5-7 8 ounce glasses of water daily. If the results of the culture are negative, and you are continuing to experience symptoms, you will need to follow-up with your primary care physician for further evaluation. Follow-up as needed.

## 2023-12-23 LAB — URINE CULTURE: Culture: 60000 — AB

## 2023-12-26 ENCOUNTER — Telehealth (HOSPITAL_COMMUNITY): Payer: Self-pay

## 2023-12-26 MED ORDER — CIPROFLOXACIN HCL 250 MG PO TABS: 250.0000 mg | ORAL_TABLET | Freq: Two times a day (BID) | ORAL | 0 refills | Status: DC

## 2023-12-26 NOTE — Telephone Encounter (Signed)
 Pt's son returned call. Advised of meds and ER precautions.  Verbalized understanding.

## 2023-12-26 NOTE — Telephone Encounter (Signed)
 Per P. Banister, MD, "Lets do Cipro 250 mg--1 twice daily for 7 days. That will cover her E. coli that is most likely the pathogen causing her trouble. The Enterococcus is most likely covered by the Cipro also, but it is also probably most likely a contaminant with the low colony count. And, if she has not improved in 48 hours, she needs to go to the emergency room for further evaluation that we cannot do in one of our clinics. Please" Attempted to reach patient x1. Unable to LVM.  Rx sent to pharmacy on file.

## 2023-12-29 ENCOUNTER — Ambulatory Visit: Payer: Medicare Other | Admitting: Family Medicine

## 2023-12-29 ENCOUNTER — Encounter: Payer: Self-pay | Admitting: Family Medicine

## 2023-12-29 ENCOUNTER — Other Ambulatory Visit (HOSPITAL_COMMUNITY)
Admission: RE | Admit: 2023-12-29 | Discharge: 2023-12-29 | Disposition: A | Discharge status: Home / Self Care | Source: Ambulatory Visit | Attending: Family Medicine | Admitting: Family Medicine

## 2023-12-29 VITALS — BP 174/68 | HR 54 | Ht 64.0 in | Wt 127.0 lb

## 2023-12-29 DIAGNOSIS — L858 Other specified epidermal thickening: Secondary | ICD-10-CM | POA: Diagnosis present

## 2023-12-29 NOTE — Progress Notes (Signed)
 BP (!) 174/68   Pulse (!) 54   Ht 5\' 4"  (1.626 m)   Wt 127 lb (57.6 kg)   SpO2 94%   BMI 21.80 kg/m    Subjective:   Patient ID: April Duke, female    DOB: 11-18-1945, 78 y.o.   MRN: [id number]  HPI: April Duke is a 78 y.o. female presenting on 12/29/2023 for lesion Left hand (Requesting removal)   HPI Skin lesion on left posterior hand Patient has a skin lesion on left posterior hand that has a horn sticking up from it.  She says that 1 part just fell off recently but it still has the thicker part underneath.  She says it has been there for quite some time but its gotten larger recently and then fell off.  Relevant past medical, surgical, family and social history reviewed and updated as indicated. Interim medical history since our last visit reviewed. Allergies and medications reviewed and updated.  Review of Systems  Skin:  Positive for rash.    Per HPI unless specifically indicated above   Allergies as of 12/29/2023       Reactions   Alcohol Palpitations      Sulfa Antibiotics Rash   Ethanol    RAPID HEARTRATE         Medication List        Accurate as of December 29, 2023  1:57 PM. If you have any questions, ask your nurse or doctor.          STOP taking these medications    ciprofloxacin 250 MG tablet Commonly known as: CIPRO Stopped by: Elige Radon Lionardo Haze       TAKE these medications    acetaminophen 500 MG tablet Commonly known as: TYLENOL Take 2 tablets (1,000 mg total) by mouth every 6 (six) hours. What changed:  when to take this reasons to take this   Airsupra 90-80 MCG/ACT Aero Generic drug: Albuterol-Budesonide Inhale 2 Inhalations into the lungs every 6 (six) hours as needed.   Airsupra 90-80 MCG/ACT Aero Generic drug: Albuterol-Budesonide Inhale 2 Inhalations into the lungs every 6 (six) hours as needed.   amLODipine 5 MG tablet Commonly known as: NORVASC Take 1 tablet (5 mg total) by mouth daily.   atenolol 50 MG  tablet Commonly known as: TENORMIN Take 1 tablet (50 mg total) by mouth daily.   calcium carbonate 1500 (600 Ca) MG Tabs tablet Commonly known as: OSCAL Take by mouth 2 (two) times daily with a meal.   captopril 25 MG tablet Commonly known as: CAPOTEN Take 1 tablet (25 mg total) by mouth daily.   cephALEXin 500 MG capsule Commonly known as: KEFLEX Take 1 capsule (500 mg total) by mouth 2 (two) times daily. What changed: Another medication with the same name was removed. Continue taking this medication, and follow the directions you see here. Changed by: Elige Radon Jaylenn Baiza   diclofenac Sodium 1 % Gel Commonly known as: Voltaren Apply 2 g topically 4 (four) times daily.   loratadine 10 MG tablet Commonly known as: CLARITIN Take 1 tablet (10 mg total) by mouth daily.   methocarbamol 500 MG tablet Commonly known as: ROBAXIN Take 1 tablet (500 mg total) by mouth every 6 (six) hours as needed for muscle spasms.   multivitamin tablet Take 1 tablet by mouth daily.   polyethylene glycol 17 g packet Commonly known as: MIRALAX / GLYCOLAX Take 17 g by mouth 2 (two) times daily.   rosuvastatin 5 MG tablet Commonly  known as: CRESTOR Take 1 tablet (5 mg total) by mouth daily.   Synthroid 50 MCG tablet Generic drug: levothyroxine Take 1 tablet (50 mcg total) by mouth daily.   traMADol 50 MG tablet Commonly known as: ULTRAM Take 1-2 tablets (50-100 mg total) by mouth every 6 (six) hours as needed for severe pain.   Xarelto 20 MG Tabs tablet Generic drug: rivaroxaban TAKE 1 TABLET BY MOUTH ONCE DAILY WITH SUPPER         Objective:   BP (!) 174/68   Pulse (!) 54   Ht 5\' 4"  (1.626 m)   Wt 127 lb (57.6 kg)   SpO2 94%   BMI 21.80 kg/m   Wt Readings from Last 3 Encounters:  12/29/23 127 lb (57.6 kg)  12/01/23 127 lb (57.6 kg)  11/24/23 127 lb (57.6 kg)    Physical Exam Vitals and nursing note reviewed.  Constitutional:      Appearance: Normal appearance.  Skin:     Findings: Lesion (Hyperkeratotic lesion on posterior left hand in the center, that horn that was there previously has fallen off and has a small scab in the center) present.  Neurological:     Mental Status: She is alert.     Skin lesion removal: Verbal consent was obtained.  Betadine was used for cleansing.  0.5cc of 2% lidocaine without epinephrine was used for anesthesia.  Shave biopsy was performed with good margins.  electrocaudery was used to achieve hemostasis.  Topical antibiotic was used and then it was covered by 3x 3 and bandage told in place. Procedure was tolerated well.  Bleeding was minimal   Assessment & Plan:   Problem List Items Addressed This Visit   None Visit Diagnoses       Cutaneous horn    -  Primary   Relevant Orders   Surgical pathology        Follow up plan: Return if symptoms worsen or fail to improve.  Counseling provided for all of the vaccine components No orders of the defined types were placed in this encounter.   Arville Care, MD [facility] 12/29/2023, 1:57 PM

## 2024-01-04 LAB — SURGICAL PATHOLOGY

## 2024-01-05 ENCOUNTER — Other Ambulatory Visit: Payer: Self-pay

## 2024-01-05 DIAGNOSIS — C44629 Squamous cell carcinoma of skin of left upper limb, including shoulder: Secondary | ICD-10-CM

## 2024-02-13 NOTE — Progress Notes (Unsigned)
  Cardiology Office Note:   Date:  02/14/2024  ID:  April Duke, DOB 1946/04/07, MRN [id number] PCP: Dettinger, Lucio Sabin, MD  [facility] Providers Cardiologist:  Eilleen Grates, MD {  History of Present Illness:   April Duke is a 78 y.o. female who was referred by Dettinger, Lucio Sabin, MD for evaluation of atrial fib.    She was previously seen by Dr. Anderson Banana at [place].   The patient has been on anticoagulation for several years.  She has had cardioversion in the past.     Since I last saw her she had hip surgery.   She is now have both of her hips replaced last year in 2024.  She walks with a cane and does pretty well.  She does not notice her chronic atrial fibrillation.  The patient denies any new symptoms such as chest discomfort, neck or arm discomfort. There has been no new shortness of breath, PND or orthopnea. There have been no reported palpitations, presyncope or syncope.   ROS: As stated in the HPI and negative for all other systems.  Studies Reviewed:    EKG:   EKG Interpretation Date/Time:  Tuesday February 14 2024 16:03:02 EDT Ventricular Rate:  43 PR Interval:    QRS Duration:  78 QT Interval:  456 QTC Calculation: 385 R Axis:   -14  Text Interpretation: Atrial fibrillation with slow ventricular response with ventricular escape complexes Confirmed by Eilleen Grates ([id number]) on 02/14/2024 4:46:50 PM    Risk Assessment/Calculations:     Physical Exam:   VS:  BP (!) 150/72 (BP Location: Left Arm, Patient Position: Sitting, Cuff Size: Normal)   Pulse (!) 43   Ht 5\' 4"  (1.626 m)   Wt 124 lb (56.2 kg)   SpO2 96%   BMI 21.28 kg/m    Wt Readings from Last 3 Encounters:  02/14/24 124 lb (56.2 kg)  12/29/23 127 lb (57.6 kg)  12/01/23 127 lb (57.6 kg)     GEN: Well nourished, well developed in no acute distress NECK: No JVD; No carotid bruits CARDIAC: Irregular RR, no murmurs, rubs, gallops RESPIRATORY:  Clear to auscultation without rales, wheezing or  rhonchi  ABDOMEN: Soft, non-tender, non-distended EXTREMITIES:  No edema; No deformity   ASSESSMENT AND PLAN:   PAF:  Ms. April Duke has a CHA2DS2 - VASc score of 3.   She tolerates anticoagulation.  No change in therapy.  She does have a low rate but she really does not want to back down on her beta-blocker because she feels palpitations when she does that.  She will let me know if she gets lightheadedness or presyncope at which point not have to back down.   DYSLIPIDEMIA:   LDL was 62.  No change in therapy.   HTN: Her blood pressure is at target at home.  She says she checks it with a new blood pressure cuff that is accurate and she is typically in the 130s to 120s systolic.  She will keep a blood pressure diary and let me know if that is not the case at which point she would need med titration.   Follow up with me in about a year.  Signed, Eilleen Grates, MD

## 2024-02-14 ENCOUNTER — Ambulatory Visit: Payer: Medicare Other | Attending: Cardiology | Admitting: Cardiology

## 2024-02-14 ENCOUNTER — Encounter: Payer: Self-pay | Admitting: Cardiology

## 2024-02-14 VITALS — BP 150/72 | HR 43 | Ht 64.0 in | Wt 124.0 lb

## 2024-02-14 DIAGNOSIS — I10 Essential (primary) hypertension: Secondary | ICD-10-CM | POA: Diagnosis present

## 2024-02-14 DIAGNOSIS — I480 Paroxysmal atrial fibrillation: Secondary | ICD-10-CM | POA: Insufficient documentation

## 2024-02-14 DIAGNOSIS — E785 Hyperlipidemia, unspecified: Secondary | ICD-10-CM | POA: Insufficient documentation

## 2024-02-14 NOTE — Patient Instructions (Signed)
 Medication Instructions:  Your physician recommends that you continue on your current medications as directed. Please refer to the Current Medication list given to you today.  *If you need a refill on your cardiac medications before your next appointment, please call your pharmacy*   Follow-Up: At [facility], you and your health needs are our priority.  As part of our continuing mission to provide you with exceptional heart care, our providers are all part of one team.  This team includes your primary Cardiologist (physician) and Advanced Practice Providers or APPs (Physician Assistants and Nurse Practitioners) who all work together to provide you with the care you need, when you need it.  Your next appointment:   1 year(s)  Provider:   Eilleen Grates, MD     We recommend signing up for the patient portal called "MyChart".  Sign up information is provided on this After Visit Summary.  MyChart is used to connect with patients for Virtual Visits (Telemedicine).  Patients are able to view lab/test results, encounter notes, upcoming appointments, etc.  Non-urgent messages can be sent to your provider as well.   To learn more about what you can do with MyChart, go to [email or web address].   Other Instructions    1st Floor: - Lobby - Registration  - Pharmacy  - Lab - Cafe  2nd Floor: - PV Lab - Diagnostic Testing (echo, CT, nuclear med)  3rd Floor: - Vacant  4th Floor: - TCTS (cardiothoracic surgery) - AFib Clinic - Structural Heart Clinic - Vascular Surgery  - Vascular Ultrasound  5th Floor: - HeartCare Cardiology (general and EP) - Clinical Pharmacy for coumadin, hypertension, lipid, weight-loss medications, and med management appointments    Valet parking services will be available as well.

## 2024-02-15 ENCOUNTER — Encounter: Payer: Self-pay | Admitting: Cardiology

## 2024-02-17 ENCOUNTER — Other Ambulatory Visit

## 2024-02-17 DIAGNOSIS — I10 Essential (primary) hypertension: Secondary | ICD-10-CM

## 2024-02-17 DIAGNOSIS — E782 Mixed hyperlipidemia: Secondary | ICD-10-CM

## 2024-02-17 DIAGNOSIS — R7303 Prediabetes: Secondary | ICD-10-CM

## 2024-02-17 DIAGNOSIS — E039 Hypothyroidism, unspecified: Secondary | ICD-10-CM

## 2024-02-17 LAB — CMP14+EGFR
ALT: 9 IU/L (ref 0–32)
AST: 20 IU/L (ref 0–40)
Albumin: 4.4 g/dL (ref 3.8–4.8)
Alkaline Phosphatase: 117 IU/L (ref 44–121)
BUN/Creatinine Ratio: 14 (ref 12–28)
BUN: 10 mg/dL (ref 8–27)
Bilirubin Total: 0.5 mg/dL (ref 0.0–1.2)
CO2: 20 mmol/L (ref 20–29)
Calcium: 10.2 mg/dL (ref 8.7–10.3)
Chloride: 97 mmol/L (ref 96–106)
Creatinine, Ser: 0.73 mg/dL (ref 0.57–1.00)
Globulin, Total: 3.4 g/dL (ref 1.5–4.5)
Glucose: 104 mg/dL — ABNORMAL HIGH (ref 70–99)
Potassium: 4.3 mmol/L (ref 3.5–5.2)
Sodium: 132 mmol/L — ABNORMAL LOW (ref 134–144)
Total Protein: 7.8 g/dL (ref 6.0–8.5)
eGFR: 84 mL/min/{1.73_m2} (ref >59)

## 2024-02-17 LAB — CBC WITH DIFFERENTIAL/PLATELET
Basophils Absolute: 0.1 10*3/uL (ref 0.0–0.2)
Basos: 1 %
EOS (ABSOLUTE): 0.4 10*3/uL (ref 0.0–0.4)
Eos: 7 %
Hematocrit: 42.2 % (ref 34.0–46.6)
Hemoglobin: 13.8 g/dL (ref 11.1–15.9)
Immature Grans (Abs): 0 10*3/uL (ref 0.0–0.1)
Immature Granulocytes: 0 %
Lymphocytes Absolute: 1.7 10*3/uL (ref 0.7–3.1)
Lymphs: 34 %
MCH: 28.6 pg (ref 26.6–33.0)
MCHC: 32.7 g/dL (ref 31.5–35.7)
MCV: 88 fL (ref 79–97)
Monocytes Absolute: 0.6 10*3/uL (ref 0.1–0.9)
Monocytes: 13 %
Neutrophils Absolute: 2.3 10*3/uL (ref 1.4–7.0)
Neutrophils: 45 %
Platelets: 263 10*3/uL (ref 150–450)
RBC: 4.82 x10E6/uL (ref 3.77–5.28)
RDW: 14.1 % (ref 11.7–15.4)
WBC: 5 10*3/uL (ref 3.4–10.8)

## 2024-02-17 LAB — TSH: TSH: 2.14 u[IU]/mL (ref 0.450–4.500)

## 2024-02-17 LAB — BAYER DCA HB A1C WAIVED: HB A1C (BAYER DCA - WAIVED): 5.9 % — ABNORMAL HIGH (ref 4.8–5.6)

## 2024-02-22 ENCOUNTER — Encounter: Payer: Self-pay | Admitting: Family Medicine

## 2024-02-22 ENCOUNTER — Ambulatory Visit: Payer: Medicare Other

## 2024-02-22 ENCOUNTER — Ambulatory Visit: Payer: Medicare Other | Admitting: Family Medicine

## 2024-02-22 VITALS — BP 145/65 | HR 38 | Ht 64.0 in | Wt 124.0 lb

## 2024-02-22 DIAGNOSIS — E039 Hypothyroidism, unspecified: Secondary | ICD-10-CM

## 2024-02-22 DIAGNOSIS — I10 Essential (primary) hypertension: Secondary | ICD-10-CM

## 2024-02-22 DIAGNOSIS — M654 Radial styloid tenosynovitis [de Quervain]: Secondary | ICD-10-CM

## 2024-02-22 DIAGNOSIS — R7303 Prediabetes: Secondary | ICD-10-CM | POA: Diagnosis not present

## 2024-02-22 DIAGNOSIS — E782 Mixed hyperlipidemia: Secondary | ICD-10-CM

## 2024-02-22 MED ORDER — METHYLPREDNISOLONE ACETATE 40 MG/ML IJ SUSP
40.0000 mg | Freq: Once | INTRAMUSCULAR | Status: AC
  Administered 2024-02-22: 40 mg via INTRALESIONAL

## 2024-02-22 NOTE — Progress Notes (Signed)
 BP (!) 145/65   Pulse (!) 38   Ht 5\' 4"  (1.626 m)   Wt 124 lb (56.2 kg)   SpO2 96%   BMI 21.28 kg/m    Subjective:   Patient ID: April Duke, female    DOB: 06-07-1946, 78 y.o.   MRN: [id number]  HPI: April Duke is a 78 y.o. female presenting on 02/22/2024 for Medical Management of Chronic Issues, Hypertension, and Hypothyroidism   HPI Hypertension Patient is currently on amlodipine  and atenolol  and captopril , and their blood pressure today is 145/65. Patient denies any lightheadedness or dizziness. Patient denies headaches, blurred vision, chest pains, shortness of breath, or weakness. Denies any side effects from medication and is content with current medication.   Hyperlipidemia Patient is coming in for recheck of his hyperlipidemia. The patient is currently taking Crestor . They deny any issues with myalgias or history of liver damage from it. They deny any focal numbness or weakness or chest pain.   Hypothyroidism recheck Patient is coming in for thyroid  recheck today as well. They deny any issues with hair changes or heat or cold problems or diarrhea or constipation. They deny any chest pain or palpitations. They are currently on levothyroxine  50 mcg daily  Prediabetes Patient comes in today for recheck of his diabetes. Patient has been currently taking no medication currently. Patient is not currently on an ACE inhibitor/ARB. Patient has not seen an ophthalmologist this year. Patient denies any new issues with their feet. The symptom started onset as an adult hypertension and hyperlipidemia and hypothyroidism ARE RELATED TO DM   Patient has been having some pains on the lateral aspect of her right wrist that hurts up to her thumb and up into her arm sometimes as well.  She says it has been bothering her off and on for couple weeks.  She has not used anything over-the-counter for it.  Relevant past medical, surgical, family and social history reviewed and updated as indicated.  Interim medical history since our last visit reviewed. Allergies and medications reviewed and updated.  Review of Systems  Constitutional:  Negative for chills and fever.  HENT:  Negative for congestion, ear discharge and ear pain.   Eyes:  Negative for redness and visual disturbance.  Respiratory:  Negative for chest tightness and shortness of breath.   Cardiovascular:  Negative for chest pain and leg swelling.  Genitourinary:  Negative for difficulty urinating and dysuria.  Musculoskeletal:  Positive for arthralgias and myalgias. Negative for back pain and gait problem.  Skin:  Negative for rash.  Neurological:  Negative for light-headedness and headaches.  Psychiatric/Behavioral:  Negative for agitation and behavioral problems.   All other systems reviewed and are negative.   Per HPI unless specifically indicated above   Allergies as of 02/22/2024       Reactions   Alcohol Palpitations      Sulfa Antibiotics Rash   Ethanol    RAPID HEARTRATE         Medication List        Accurate as of February 22, 2024  2:38 PM. If you have any questions, ask your nurse or doctor.          STOP taking these medications    acetaminophen  500 MG tablet Commonly known as: TYLENOL  Stopped by: Lucio Sabin Miran Kautzman   Airsupra  90-80 MCG/ACT Aero Generic drug: Albuterol-Budesonide Stopped by: Lucio Sabin Elzabeth Mcquerry   cephALEXin  500 MG capsule Commonly known as: KEFLEX  Stopped by: Lucio Sabin Zeven Kocak  diclofenac  Sodium 1 % Gel Commonly known as: Voltaren  Stopped by: Lucio Sabin Armari Fussell   loratadine  10 MG tablet Commonly known as: CLARITIN  Stopped by: Lucio Sabin Berenice Oehlert   methocarbamol  500 MG tablet Commonly known as: ROBAXIN  Stopped by: Lucio Sabin Shiana Rappleye   polyethylene glycol 17 g packet Commonly known as: MIRALAX  / GLYCOLAX  Stopped by: Lucio Sabin Gabreal Worton   traMADol  50 MG tablet Commonly known as: ULTRAM  Stopped by: Lucio Sabin Kirbi Farrugia       TAKE these medications     amLODipine  5 MG tablet Commonly known as: NORVASC  Take 1 tablet (5 mg total) by mouth daily.   atenolol  50 MG tablet Commonly known as: TENORMIN  Take 1 tablet (50 mg total) by mouth daily.   calcium  carbonate 1500 (600 Ca) MG Tabs tablet Commonly known as: OSCAL Take 1 tablet by mouth daily at 6 (six) AM.   captopril  25 MG tablet Commonly known as: CAPOTEN  Take 1 tablet (25 mg total) by mouth daily.   magnesium hydroxide 400 MG/5ML suspension Commonly known as: MILK OF MAGNESIA Take 5 mLs by mouth daily as needed for mild constipation.   multivitamin tablet Take 1 tablet by mouth daily.   rosuvastatin  5 MG tablet Commonly known as: CRESTOR  Take 1 tablet (5 mg total) by mouth daily.   Synthroid  50 MCG tablet Generic drug: levothyroxine  Take 1 tablet (50 mcg total) by mouth daily.   Xarelto  20 MG Tabs tablet Generic drug: rivaroxaban  TAKE 1 TABLET BY MOUTH ONCE DAILY WITH SUPPER         Objective:   BP (!) 145/65   Pulse (!) 38   Ht 5\' 4"  (1.626 m)   Wt 124 lb (56.2 kg)   SpO2 96%   BMI 21.28 kg/m   Wt Readings from Last 3 Encounters:  02/22/24 124 lb (56.2 kg)  02/14/24 124 lb (56.2 kg)  12/29/23 127 lb (57.6 kg)    Physical Exam Vitals and nursing note reviewed.  Constitutional:      General: She is not in acute distress.    Appearance: She is well-developed. She is not diaphoretic.  Eyes:     Conjunctiva/sclera: Conjunctivae normal.  Cardiovascular:     Rate and Rhythm: Normal rate and regular rhythm.     Heart sounds: Normal heart sounds. No murmur heard. Pulmonary:     Effort: Pulmonary effort is normal. No respiratory distress.     Breath sounds: Normal breath sounds. No wheezing.  Musculoskeletal:        General: Tenderness present. Normal range of motion.     Right wrist: Tenderness (Lateral tenderness, positive pain with medial flexion) present.  Skin:    General: Skin is warm and dry.     Findings: No rash.  Neurological:      Mental Status: She is alert and oriented to person, place, and time.     Coordination: Coordination normal.  Psychiatric:        Behavior: Behavior normal.     Results for orders placed or performed in visit on 02/17/24  Bayer DCA Hb A1c Waived   Collection Time: 02/17/24  9:57 AM  Result Value Ref Range   HB A1C (BAYER DCA - WAIVED) 5.9 (H) 4.8 - 5.6 %  TSH   Collection Time: 02/17/24  9:59 AM  Result Value Ref Range   TSH 2.140 0.450 - 4.500 uIU/mL  CMP14+EGFR   Collection Time: 02/17/24  9:59 AM  Result Value Ref Range   Glucose 104 (H) 70 -  99 mg/dL   BUN 10 8 - 27 mg/dL   Creatinine, Ser 1.61 0.57 - 1.00 mg/dL   eGFR 84 >09 UE/AVW/0.98   BUN/Creatinine Ratio 14 12 - 28   Sodium 132 (L) 134 - 144 mmol/L   Potassium 4.3 3.5 - 5.2 mmol/L   Chloride 97 96 - 106 mmol/L   CO2 20 20 - 29 mmol/L   Calcium  10.2 8.7 - 10.3 mg/dL   Total Protein 7.8 6.0 - 8.5 g/dL   Albumin 4.4 3.8 - 4.8 g/dL   Globulin, Total 3.4 1.5 - 4.5 g/dL   Bilirubin Total 0.5 0.0 - 1.2 mg/dL   Alkaline Phosphatase 117 44 - 121 IU/L   AST 20 0 - 40 IU/L   ALT 9 0 - 32 IU/L  CBC with Differential/Platelet   Collection Time: 02/17/24  9:59 AM  Result Value Ref Range   WBC 5.0 3.4 - 10.8 x10E3/uL   RBC 4.82 3.77 - 5.28 x10E6/uL   Hemoglobin 13.8 11.1 - 15.9 g/dL   Hematocrit 11.9 14.7 - 46.6 %   MCV 88 79 - 97 fL   MCH 28.6 26.6 - 33.0 pg   MCHC 32.7 31.5 - 35.7 g/dL   RDW 82.9 56.2 - 13.0 %   Platelets 263 150 - 450 x10E3/uL   Neutrophils 45 Not Estab. %   Lymphs 34 Not Estab. %   Monocytes 13 Not Estab. %   Eos 7 Not Estab. %   Basos 1 Not Estab. %   Neutrophils Absolute 2.3 1.4 - 7.0 x10E3/uL   Lymphocytes Absolute 1.7 0.7 - 3.1 x10E3/uL   Monocytes Absolute 0.6 0.1 - 0.9 x10E3/uL   EOS (ABSOLUTE) 0.4 0.0 - 0.4 x10E3/uL   Basophils Absolute 0.1 0.0 - 0.2 x10E3/uL   Immature Granulocytes 0 Not Estab. %   Immature Grans (Abs) 0.0 0.0 - 0.1 x10E3/uL   Right de Quervain's injection: Consent  form signed. Risk factors of bleeding and infection discussed with patient and patient is agreeable towards injection. Patient prepped with Betadine . Lateral approach towards injection used. Injected 40 mg of Depo-Medrol and 1 mL of 2% lidocaine . Patient tolerated procedure well and no side effects from noted. Minimal to no bleeding. Simple bandage applied after.  Assessment & Plan:   Problem List Items Addressed This Visit       Cardiovascular and Mediastinum   HTN (hypertension) - Primary     Endocrine   Hypothyroid     Other   Hyperlipemia   Prediabetes   Other Visit Diagnoses       De Quervain's tenosynovitis       Relevant Medications   methylPREDNISolone acetate (DEPO-MEDROL) injection 40 mg (Start on 02/22/2024  2:45 PM)       Gave wrist injection for de Quervain's, her blood work looks great.  Her blood pressure is mildly elevated but she will keep an eye on it for now.  No changes Follow up plan: Return in about 6 months (around 08/23/2024), or if symptoms worsen or fail to improve, for Prediabetes hypertension thyroid  recheck.  Counseling provided for all of the vaccine components No orders of the defined types were placed in this encounter.   Jolyne Needs, MD [facility] 02/22/2024, 2:38 PM

## 2024-03-06 ENCOUNTER — Other Ambulatory Visit: Payer: Self-pay | Admitting: Cardiology

## 2024-05-08 ENCOUNTER — Other Ambulatory Visit: Payer: Self-pay | Admitting: Cardiology

## 2024-05-08 DIAGNOSIS — I480 Paroxysmal atrial fibrillation: Secondary | ICD-10-CM

## 2024-05-09 NOTE — Telephone Encounter (Signed)
 Xarelto  20mg  refill request received. Pt is 78 years old, weight-56.2kg, Crea-0.73 on 02/17/24, last seen by Dr. Lavona on 02/14/24, Diagnosis-Afib, CrCl-55.44 mL/min; Dose is appropriate based on dosing criteria. Will send in refill to requested pharmacy.

## 2024-06-15 ENCOUNTER — Other Ambulatory Visit

## 2024-06-15 DIAGNOSIS — I10 Essential (primary) hypertension: Secondary | ICD-10-CM

## 2024-06-15 DIAGNOSIS — R7303 Prediabetes: Secondary | ICD-10-CM

## 2024-06-15 DIAGNOSIS — E039 Hypothyroidism, unspecified: Secondary | ICD-10-CM

## 2024-06-15 DIAGNOSIS — E782 Mixed hyperlipidemia: Secondary | ICD-10-CM

## 2024-06-15 LAB — BAYER DCA HB A1C WAIVED: HB A1C (BAYER DCA - WAIVED): 5.7 % — ABNORMAL HIGH (ref 4.8–5.6)

## 2024-06-16 LAB — CMP14+EGFR
ALT: 13 IU/L (ref 0–32)
AST: 24 IU/L (ref 0–40)
Albumin: 4.7 g/dL (ref 3.8–4.8)
Alkaline Phosphatase: 96 IU/L (ref 44–121)
BUN/Creatinine Ratio: 16 (ref 12–28)
BUN: 12 mg/dL (ref 8–27)
Bilirubin Total: 0.5 mg/dL (ref 0.0–1.2)
CO2: 20 mmol/L (ref 20–29)
Calcium: 10.1 mg/dL (ref 8.7–10.3)
Chloride: 97 mmol/L (ref 96–106)
Creatinine, Ser: 0.75 mg/dL (ref 0.57–1.00)
Globulin, Total: 3.1 g/dL (ref 1.5–4.5)
Glucose: 98 mg/dL (ref 70–99)
Potassium: 4.5 mmol/L (ref 3.5–5.2)
Sodium: 135 mmol/L (ref 134–144)
Total Protein: 7.8 g/dL (ref 6.0–8.5)
eGFR: 81 mL/min/1.73 (ref >59)

## 2024-06-16 LAB — LIPID PANEL
Chol/HDL Ratio: 2.4 ratio (ref 0.0–4.4)
Cholesterol, Total: 135 mg/dL (ref 100–199)
HDL: 57 mg/dL (ref >39)
LDL Chol Calc (NIH): 65 mg/dL (ref 0–99)
Triglycerides: 65 mg/dL (ref 0–149)
VLDL Cholesterol Cal: 13 mg/dL (ref 5–40)

## 2024-06-16 LAB — CBC WITH DIFFERENTIAL/PLATELET
Basophils Absolute: 0.1 x10E3/uL (ref 0.0–0.2)
Basos: 1 %
EOS (ABSOLUTE): 0.4 x10E3/uL (ref 0.0–0.4)
Eos: 7 %
Hematocrit: 43.8 % (ref 34.0–46.6)
Hemoglobin: 14.4 g/dL (ref 11.1–15.9)
Immature Grans (Abs): 0 x10E3/uL (ref 0.0–0.1)
Immature Granulocytes: 0 %
Lymphocytes Absolute: 1.6 x10E3/uL (ref 0.7–3.1)
Lymphs: 30 %
MCH: 30.7 pg (ref 26.6–33.0)
MCHC: 32.9 g/dL (ref 31.5–35.7)
MCV: 93 fL (ref 79–97)
Monocytes Absolute: 0.7 x10E3/uL (ref 0.1–0.9)
Monocytes: 14 %
Neutrophils Absolute: 2.6 x10E3/uL (ref 1.4–7.0)
Neutrophils: 48 %
Platelets: 219 x10E3/uL (ref 150–450)
RBC: 4.69 x10E6/uL (ref 3.77–5.28)
RDW: 13.6 % (ref 11.7–15.4)
WBC: 5.3 x10E3/uL (ref 3.4–10.8)

## 2024-06-16 LAB — TSH: TSH: 1.98 u[IU]/mL (ref 0.450–4.500)

## 2024-06-21 ENCOUNTER — Ambulatory Visit: Payer: Self-pay | Admitting: Family Medicine

## 2024-06-22 ENCOUNTER — Ambulatory Visit

## 2024-06-22 ENCOUNTER — Other Ambulatory Visit: Payer: Self-pay | Admitting: Family Medicine

## 2024-06-22 ENCOUNTER — Ambulatory Visit: Admitting: Family Medicine

## 2024-06-22 ENCOUNTER — Encounter: Payer: Self-pay | Admitting: Family Medicine

## 2024-06-22 VITALS — BP 154/59 | HR 47 | Temp 96.7°F | Ht 64.0 in | Wt 124.0 lb

## 2024-06-22 DIAGNOSIS — I10 Essential (primary) hypertension: Secondary | ICD-10-CM

## 2024-06-22 DIAGNOSIS — Z780 Asymptomatic menopausal state: Secondary | ICD-10-CM

## 2024-06-22 DIAGNOSIS — E039 Hypothyroidism, unspecified: Secondary | ICD-10-CM | POA: Diagnosis not present

## 2024-06-22 DIAGNOSIS — R7303 Prediabetes: Secondary | ICD-10-CM

## 2024-06-22 DIAGNOSIS — E782 Mixed hyperlipidemia: Secondary | ICD-10-CM

## 2024-06-22 DIAGNOSIS — Z13820 Encounter for screening for osteoporosis: Secondary | ICD-10-CM

## 2024-06-22 MED ORDER — AMLODIPINE BESYLATE 5 MG PO TABS: 5.0000 mg | ORAL_TABLET | Freq: Every day | ORAL | 3 refills | Status: AC

## 2024-06-22 MED ORDER — ROSUVASTATIN CALCIUM 5 MG PO TABS: 5.0000 mg | ORAL_TABLET | Freq: Every day | ORAL | 3 refills | Status: AC

## 2024-06-22 MED ORDER — SYNTHROID 50 MCG PO TABS: 50.0000 ug | ORAL_TABLET | Freq: Every day | ORAL | 3 refills | Status: AC

## 2024-06-22 NOTE — Progress Notes (Signed)
 BP (!) 151/66   Pulse (!) 47   Temp (!) 96.7 F (35.9 C) (Temporal)   Ht 5' 4 (1.626 m)   Wt 124 lb (56.2 kg)   BMI 21.28 kg/m    Subjective:   Patient ID: April Duke, female    DOB: [birth year], 78 y.o.   MRN: [id number]  HPI: April Duke is a 78 y.o. female presenting on 06/22/2024 for Medical Management of Chronic Issues   Discussed the use of AI scribe software for clinical note transcription with the patient, who gave verbal consent to proceed.  History of Present Illness   April Duke is a 78 year old female with hypertension, thyroid  issues, and hyperlipidemia who presents for a recheck of her blood pressure, thyroid , and cholesterol.  Her blood pressure was 152/70 mmHg at home this morning and 151/66 mmHg in the office. She is currently taking amlodipine  and atenolol  daily. Her blood pressure often decreases to around 130/60 mmHg after resting for a few minutes.  She is also here for a recheck of her thyroid  and cholesterol levels. She has been taking a blood thinner for about ten years without any bleeding issues. Recent blood work showed good kidney and liver function, a blood sugar level of 98 mg/dL, a hemoglobin level of 14.4 g/dL, and a platelet count of 219,000/mm. Her thyroid  level was 1.9 mIU/L, and her cholesterol levels were both 65 mg/dL. Her A1c was 5.7%.  She has a new sore on her leg that appeared about two weeks ago. It started as a small scab with a black center and has been growing and is very sore. She describes it as having a core and some discharge, resembling a cyst. Her brother has a history of skin cancer, and she has lighter skin.  She experiences frequent heartburn and inquires about medication that can be taken alongside her current medications. She is concerned about potential side effects on her kidneys from heartburn medications.  No blood in her urine or stool.          Relevant past medical, surgical, family and social history reviewed and  updated as indicated. Interim medical history since our last visit reviewed. Allergies and medications reviewed and updated.  Review of Systems  Constitutional:  Negative for chills and fever.  HENT:  Negative for congestion, ear discharge and ear pain.   Eyes:  Negative for redness and visual disturbance.  Respiratory:  Negative for chest tightness and shortness of breath.   Cardiovascular:  Negative for chest pain and leg swelling.  Genitourinary:  Negative for difficulty urinating and dysuria.  Musculoskeletal:  Negative for back pain and gait problem.  Skin:  Negative for rash.  Neurological:  Negative for dizziness, light-headedness and headaches.  Psychiatric/Behavioral:  Negative for agitation and behavioral problems.   All other systems reviewed and are negative.   Per HPI unless specifically indicated above   Allergies as of 06/22/2024       Reactions   Alcohol Palpitations      Sulfa Antibiotics Rash   Ethanol    RAPID HEARTRATE         Medication List        Accurate as of June 22, 2024  2:05 PM. If you have any questions, ask your nurse or doctor.          amLODipine  5 MG tablet Commonly known as: NORVASC  Take 1 tablet (5 mg total) by mouth daily.   atenolol  50 MG tablet Commonly known as:  TENORMIN  Take 1 tablet by mouth once daily   calcium  carbonate 1500 (600 Ca) MG Tabs tablet Commonly known as: OSCAL Take 1 tablet by mouth daily at 6 (six) AM.   captopril  25 MG tablet Commonly known as: CAPOTEN  Take 1 tablet by mouth once daily   magnesium hydroxide 400 MG/5ML suspension Commonly known as: MILK OF MAGNESIA Take 5 mLs by mouth daily as needed for mild constipation.   multivitamin tablet Take 1 tablet by mouth daily.   rosuvastatin  5 MG tablet Commonly known as: CRESTOR  Take 1 tablet (5 mg total) by mouth daily.   Synthroid  50 MCG tablet Generic drug: levothyroxine  Take 1 tablet (50 mcg total) by mouth daily.   Xarelto  20 MG Tabs  tablet Generic drug: rivaroxaban  TAKE 1 TABLET BY MOUTH ONCE DAILY WITH SUPPER         Objective:   BP (!) 151/66   Pulse (!) 47   Temp (!) 96.7 F (35.9 C) (Temporal)   Ht 5' 4 (1.626 m)   Wt 124 lb (56.2 kg)   BMI 21.28 kg/m   Wt Readings from Last 3 Encounters:  06/22/24 124 lb (56.2 kg)  02/22/24 124 lb (56.2 kg)  02/14/24 124 lb (56.2 kg)    Physical Exam Physical Exam   VITALS: BP- 151/66 NECK: Thyroid  non-tender, no thyromegaly. CHEST: Lungs clear to auscultation bilaterally. CARDIOVASCULAR: Regular rate and rhythm, no murmurs. EXTREMITIES: No edema, good peripheral pulses. SKIN: Cyst on leg, cleaned out.       Results for orders placed or performed in visit on 06/15/24  Bayer DCA Hb A1c Waived   Collection Time: 06/15/24 10:09 AM  Result Value Ref Range   HB A1C (BAYER DCA - WAIVED) 5.7 (H) 4.8 - 5.6 %  CBC with Differential/Platelet   Collection Time: 06/15/24 10:20 AM  Result Value Ref Range   WBC 5.3 3.4 - 10.8 x10E3/uL   RBC 4.69 3.77 - 5.28 x10E6/uL   Hemoglobin 14.4 11.1 - 15.9 g/dL   Hematocrit 56.1 65.9 - 46.6 %   MCV 93 79 - 97 fL   MCH 30.7 26.6 - 33.0 pg   MCHC 32.9 31.5 - 35.7 g/dL   RDW 86.3 88.2 - 84.5 %   Platelets 219 150 - 450 x10E3/uL   Neutrophils 48 Not Estab. %   Lymphs 30 Not Estab. %   Monocytes 14 Not Estab. %   Eos 7 Not Estab. %   Basos 1 Not Estab. %   Neutrophils Absolute 2.6 1.4 - 7.0 x10E3/uL   Lymphocytes Absolute 1.6 0.7 - 3.1 x10E3/uL   Monocytes Absolute 0.7 0.1 - 0.9 x10E3/uL   EOS (ABSOLUTE) 0.4 0.0 - 0.4 x10E3/uL   Basophils Absolute 0.1 0.0 - 0.2 x10E3/uL   Immature Granulocytes 0 Not Estab. %   Immature Grans (Abs) 0.0 0.0 - 0.1 x10E3/uL  CMP14+EGFR   Collection Time: 06/15/24 10:20 AM  Result Value Ref Range   Glucose 98 70 - 99 mg/dL   BUN 12 8 - 27 mg/dL   Creatinine, Ser 9.24 0.57 - 1.00 mg/dL   eGFR 81 >40 fO/fpw/8.26   BUN/Creatinine Ratio 16 12 - 28   Sodium 135 134 - 144 mmol/L   Potassium  4.5 3.5 - 5.2 mmol/L   Chloride 97 96 - 106 mmol/L   CO2 20 20 - 29 mmol/L   Calcium  10.1 8.7 - 10.3 mg/dL   Total Protein 7.8 6.0 - 8.5 g/dL   Albumin 4.7 3.8 - 4.8 g/dL  Globulin, Total 3.1 1.5 - 4.5 g/dL   Bilirubin Total 0.5 0.0 - 1.2 mg/dL   Alkaline Phosphatase 96 44 - 121 IU/L   AST 24 0 - 40 IU/L   ALT 13 0 - 32 IU/L  TSH   Collection Time: 06/15/24 10:20 AM  Result Value Ref Range   TSH 1.980 0.450 - 4.500 uIU/mL  Lipid panel   Collection Time: 06/15/24 10:20 AM  Result Value Ref Range   Cholesterol, Total 135 100 - 199 mg/dL   Triglycerides 65 0 - 149 mg/dL   HDL 57 >60 mg/dL   VLDL Cholesterol Cal 13 5 - 40 mg/dL   LDL Chol Calc (NIH) 65 0 - 99 mg/dL   Chol/HDL Ratio 2.4 0.0 - 4.4 ratio    Assessment & Plan:   Problem List Items Addressed This Visit       Cardiovascular and Mediastinum   HTN (hypertension) - Primary   Relevant Medications   amLODipine  (NORVASC ) 5 MG tablet   rosuvastatin  (CRESTOR ) 5 MG tablet     Endocrine   Hypothyroid   Relevant Medications   SYNTHROID  50 MCG tablet     Other   Hyperlipemia   Relevant Medications   amLODipine  (NORVASC ) 5 MG tablet   rosuvastatin  (CRESTOR ) 5 MG tablet   Prediabetes       Essential hypertension Blood pressure elevated at 151/66 in office. Home readings show systolic pressure around 152/70, decreasing to 130s/60s after rest. Discussed risks of sustained hypertension, including stroke and heart attack. - Recheck blood pressure before leaving the office. - Monitor blood pressure at home regularly. - Advise to rest and recheck if blood pressure is high. - Contact the office if blood pressure remains high for multiple days.  Hypothyroidism Thyroid  function well-managed with TSH level of 1.9.  Mixed hyperlipidemia Cholesterol levels well-controlled with LDL and HDL at 65.  Prediabetes A1c is 5.7, indicating well-managed prediabetes. Blood sugar level is 98, within normal range.  Cutaneous cyst,  left leg Painful cyst on the left leg for two weeks, with a black center and tenderness. Drained, contents suggest cyst rather than skin cancer. Discussed potential for malignancy given family history and fair skin. - Apply a Band-Aid to the area. - Monitor the cyst for one month. - Return if the cyst does not clear up or grows. - Consider referral to a dermatologist if the cyst persists or enlarges.  Heartburn Reports frequent heartburn. Discussed over-the-counter treatment options. Famotidine 20 mg twice daily recommended, safe with current medications and does not typically affect kidney function. - Recommend famotidine 20 mg twice daily. - Advise to take famotidine for a couple of months and then as needed. - Purchase famotidine over-the-counter.          Follow up plan: Return in about 6 months (around 12/22/2024), or if symptoms worsen or fail to improve, for hypertension.  Counseling provided for all of the vaccine components No orders of the defined types were placed in this encounter.   Fonda Levins, MD [facility] 06/22/2024, 2:05 PM

## 2024-06-27 ENCOUNTER — Other Ambulatory Visit: Payer: Self-pay | Admitting: Family Medicine

## 2024-06-27 DIAGNOSIS — Z780 Asymptomatic menopausal state: Secondary | ICD-10-CM

## 2024-06-27 DIAGNOSIS — Z13820 Encounter for screening for osteoporosis: Secondary | ICD-10-CM

## 2024-07-12 ENCOUNTER — Telehealth: Payer: Self-pay | Admitting: Family Medicine

## 2024-07-12 DIAGNOSIS — L570 Actinic keratosis: Secondary | ICD-10-CM

## 2024-07-12 NOTE — Telephone Encounter (Unsigned)
 Copied from CRM #[id number]. Topic: Referral - Request for Referral >> Jul 12, 2024  4:04 PM Selinda RAMAN wrote: Did the patient discuss referral with their provider in the last year? Yes briefly at last appointment (If No - schedule appointment) (If Yes - send message)  Appointment offered? No as she was recently seen and the provider did a little procedure hoping it would take care of it but it hasn't  Type of order/referral and detailed reason for visit: Referral to Dermatologist for spot on her left leg that will not go away  Preference of office, provider, location: Dr Shona Dermatologist in [place]  If referral order, have you been seen by this specialty before? No (If Yes, this issue or another issue? When? Where?  Can we respond through MyChart? No  Please assist patient further

## 2024-07-12 NOTE — Telephone Encounter (Signed)
 Please review

## 2024-07-13 NOTE — Telephone Encounter (Signed)
 Referral placed for patient.

## 2024-07-13 NOTE — Telephone Encounter (Signed)
 Yes please place referral, dx actinic keratoses

## 2024-07-23 ENCOUNTER — Ambulatory Visit: Payer: Self-pay

## 2024-07-23 NOTE — Telephone Encounter (Signed)
 Patient son called for patient-states she has an appointment in a couple of weeks with dermatology. Son states he was told that she needed to have some round of antibiotics prior to dermatology seeing her. Scheduled patient for acute appointment for 07/25/2024 at 4:10 PM  FYI Only or Action Required?: FYI only for provider.  Patient was last seen in primary care on 06/22/2024 by Dettinger, Fonda LABOR, MD.  Called Nurse Triage reporting Cyst.  Symptoms began several weeks ago.  Interventions attempted: Rest, hydration, or home remedies.  Symptoms are: unchanged.  Triage Disposition: See PCP When Office is Open (Within 3 Days)  Patient/caregiver understands and will follow disposition?: Yes  Copied from CRM #[id number]. Topic: Clinical - Red Word Triage >> Jul 23, 2024  4:22 PM Merlynn A wrote: Red Word that prompted transfer to Nurse Triage: Cyst on left leg or skin cancer/pain and discomfort Reason for Disposition  [1] Small swelling or lump AND [2] unexplained AND [3] present > 1 week  Answer Assessment - Initial Assessment Questions 1. APPEARANCE of SWELLING: What does it look like?     Area of redness  2. SIZE: How large is the swelling? (e.g., inches, cm; or compare to size of pinhead, tip of pen, eraser, coin, pea, grape, ping pong ball)      Size of a penny or smaller 3. LOCATION: Where is the swelling located?     Left leg 4. ONSET: When did the swelling start?     Started prior to appointment with PCP 5. COLOR: What color is it? Is there more than one color?     red 6. PAIN: Is there any pain? If Yes, ask: How bad is the pain? (Scale 1-10; or mild, moderate, severe)       2-4 out of 10 7. ITCH: Does it itch? If Yes, ask: How bad is the itch?      Mild itching 8. CAUSE: What do you think caused the swelling?     Possible cyst 9 OTHER SYMPTOMS: Do you have any other symptoms? (e.g., fever)     no  Protocols used: Skin Lump or Localized  Swelling-A-AH

## 2024-07-23 NOTE — Telephone Encounter (Signed)
 Pt has appt

## 2024-07-25 ENCOUNTER — Encounter: Payer: Self-pay | Admitting: Family Medicine

## 2024-07-25 ENCOUNTER — Ambulatory Visit (INDEPENDENT_AMBULATORY_CARE_PROVIDER_SITE_OTHER): Admitting: Family Medicine

## 2024-07-25 VITALS — BP 138/72 | HR 44 | Temp 96.0°F | Ht 64.0 in | Wt 124.0 lb

## 2024-07-25 DIAGNOSIS — C44719 Basal cell carcinoma of skin of left lower limb, including hip: Secondary | ICD-10-CM

## 2024-07-25 MED ORDER — CEPHALEXIN 500 MG PO CAPS: 500.0000 mg | ORAL_CAPSULE | Freq: Four times a day (QID) | ORAL | 0 refills | Status: DC

## 2024-07-25 NOTE — Progress Notes (Signed)
 BP 138/72   Pulse (!) 44   Temp (!) 96 F (35.6 C)   Ht 5' 4 (1.626 m)   Wt 124 lb (56.2 kg)   SpO2 96%   BMI 21.28 kg/m    Subjective:   Patient ID: April Duke, female    DOB: 11-12-1945, 78 y.o.   MRN: [id number]  HPI: April Duke is a 78 y.o. female presenting on 07/25/2024 for growth LLE (Raised, scabbing, tender- thought cyst but not going away. Has an appt with Derm in 2w)   Discussed the use of AI scribe software for clinical note transcription with the patient, who gave verbal consent to proceed.  History of Present Illness   April Duke is a 78 year old female who presents with a concerning skin lesion on her leg.  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 Interim medical history since our last visit reviewed. Allergies and medications reviewed and updated.  Review of Systems  Constitutional:  Negative for chills and fever.  Skin:  Positive for rash. Negative for color change and wound.    Per HPI unless specifically indicated above   Allergies as of 07/25/2024       Reactions   Alcohol Palpitations      Sulfa Antibiotics Rash   Ethanol    RAPID HEARTRATE         Medication List        Accurate as of July 25, 2024  4:32 PM. If you have any questions, ask your nurse or doctor.          amLODipine  5 MG tablet Commonly known as: NORVASC  Take 1 tablet (5 mg total) by mouth daily.   atenolol  50 MG tablet Commonly known as: TENORMIN  Take 1  tablet by mouth once daily   calcium  carbonate 1500 (600 Ca) MG Tabs tablet Commonly known as: OSCAL Take 1 tablet by mouth daily at 6 (six) AM.   captopril  25 MG tablet Commonly known as: CAPOTEN  Take 1 tablet by mouth once daily   cephALEXin  500 MG capsule Commonly known as: KEFLEX  Take 1 capsule (500 mg total) by mouth 4 (four) times daily. Started by: Fonda LABOR Harrington Jobe   magnesium hydroxide 400 MG/5ML suspension Commonly known as: MILK OF MAGNESIA Take 5 mLs by mouth daily as needed for mild constipation.   multivitamin tablet Take 1 tablet by mouth daily.   rosuvastatin  5 MG tablet Commonly known as: CRESTOR  Take 1 tablet (5 mg total) by mouth daily.   Synthroid  50 MCG tablet Generic drug: levothyroxine  Take 1 tablet (50 mcg total) by mouth daily.   Xarelto  20 MG Tabs tablet Generic drug: rivaroxaban  TAKE 1 TABLET BY MOUTH ONCE DAILY WITH SUPPER         Objective:   BP 138/72   Pulse (!) 44   Temp (!) 96 F (35.6 C)   Ht 5' 4 (1.626 m)   Wt 124 lb (56.2 kg)   SpO2 96%   BMI 21.28 kg/m   Wt  Readings from Last 3 Encounters:  07/25/24 124 lb (56.2 kg)  06/22/24 124 lb (56.2 kg)  02/22/24 124 lb (56.2 kg)    Physical Exam Physical Exam   GENERAL: No acute distress, well developed, well nourished. SKIN: Skin lesion on leg, concerning for skin cancer, possibly squamous or basal cell carcinoma.         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 No signs of infection or metastasis. Typically treated effectively by excision. - Prescribed Keflex  for 7 days, to start 1.5 weeks before Dr. Eda' appointment. - Instructed to break  Keflex  tablet in half if swallowing is difficult. - Ensure Dr. Eda sends biopsy results post-excision.          Follow up plan: Return if symptoms worsen or fail to improve.  Counseling provided for all of the vaccine components No orders of the defined types were placed in this encounter.   Fonda Levins, MD [facility] 07/25/2024, 4:32 PM

## 2024-10-15 ENCOUNTER — Other Ambulatory Visit: Payer: Self-pay | Admitting: Family Medicine

## 2024-10-15 ENCOUNTER — Telehealth: Payer: Self-pay | Admitting: Family Medicine

## 2024-10-15 DIAGNOSIS — I10 Essential (primary) hypertension: Secondary | ICD-10-CM

## 2024-10-15 DIAGNOSIS — E782 Mixed hyperlipidemia: Secondary | ICD-10-CM

## 2024-10-15 DIAGNOSIS — R7303 Prediabetes: Secondary | ICD-10-CM

## 2024-10-15 DIAGNOSIS — E039 Hypothyroidism, unspecified: Secondary | ICD-10-CM

## 2024-10-15 NOTE — Telephone Encounter (Signed)
 Patient coming 12-24 to do her labs for her appt with Dettinger 12-29. Please add orders.

## 2024-10-15 NOTE — Telephone Encounter (Signed)
 Labs ordered.

## 2024-10-17 ENCOUNTER — Other Ambulatory Visit

## 2024-10-17 DIAGNOSIS — I10 Essential (primary) hypertension: Secondary | ICD-10-CM

## 2024-10-17 DIAGNOSIS — R7303 Prediabetes: Secondary | ICD-10-CM

## 2024-10-17 DIAGNOSIS — E782 Mixed hyperlipidemia: Secondary | ICD-10-CM

## 2024-10-17 DIAGNOSIS — E039 Hypothyroidism, unspecified: Secondary | ICD-10-CM

## 2024-10-17 LAB — CBC WITH DIFFERENTIAL/PLATELET
Basophils Absolute: 0.1 x10E3/uL (ref 0.0–0.2)
Basos: 1 %
EOS (ABSOLUTE): 0.4 x10E3/uL (ref 0.0–0.4)
Eos: 6 %
Hematocrit: 41.9 % (ref 34.0–46.6)
Hemoglobin: 13.9 g/dL (ref 11.1–15.9)
Immature Grans (Abs): 0 x10E3/uL (ref 0.0–0.1)
Immature Granulocytes: 0 %
Lymphocytes Absolute: 1.5 x10E3/uL (ref 0.7–3.1)
Lymphs: 25 %
MCH: 30.5 pg (ref 26.6–33.0)
MCHC: 33.2 g/dL (ref 31.5–35.7)
MCV: 92 fL (ref 79–97)
Monocytes Absolute: 0.8 x10E3/uL (ref 0.1–0.9)
Monocytes: 14 %
Neutrophils Absolute: 3.3 x10E3/uL (ref 1.4–7.0)
Neutrophils: 54 %
Platelets: 241 x10E3/uL (ref 150–450)
RBC: 4.56 x10E6/uL (ref 3.77–5.28)
RDW: 13.2 % (ref 11.7–15.4)
WBC: 6 x10E3/uL (ref 3.4–10.8)

## 2024-10-17 LAB — TSH: TSH: 1.76 u[IU]/mL (ref 0.450–4.500)

## 2024-10-17 LAB — CMP14+EGFR
ALT: 7 IU/L (ref 0–32)
AST: 17 IU/L (ref 0–40)
Albumin: 4.6 g/dL (ref 3.8–4.8)
Alkaline Phosphatase: 106 IU/L (ref 49–135)
BUN/Creatinine Ratio: 11 — ABNORMAL LOW (ref 12–28)
BUN: 8 mg/dL (ref 8–27)
Bilirubin Total: 0.6 mg/dL (ref 0.0–1.2)
CO2: 23 mmol/L (ref 20–29)
Calcium: 10 mg/dL (ref 8.7–10.3)
Chloride: 96 mmol/L (ref 96–106)
Creatinine, Ser: 0.71 mg/dL (ref 0.57–1.00)
Globulin, Total: 3 g/dL (ref 1.5–4.5)
Glucose: 120 mg/dL — ABNORMAL HIGH (ref 70–99)
Potassium: 4.4 mmol/L (ref 3.5–5.2)
Sodium: 132 mmol/L — ABNORMAL LOW (ref 134–144)
Total Protein: 7.6 g/dL (ref 6.0–8.5)
eGFR: 86 mL/min/1.73

## 2024-10-17 LAB — LIPID PANEL
Chol/HDL Ratio: 2.6 ratio (ref 0.0–4.4)
Cholesterol, Total: 138 mg/dL (ref 100–199)
HDL: 54 mg/dL
LDL Chol Calc (NIH): 69 mg/dL (ref 0–99)
Triglycerides: 73 mg/dL (ref 0–149)
VLDL Cholesterol Cal: 15 mg/dL (ref 5–40)

## 2024-10-17 LAB — BAYER DCA HB A1C WAIVED: HB A1C (BAYER DCA - WAIVED): 6 % — ABNORMAL HIGH (ref 4.8–5.6)

## 2024-10-22 ENCOUNTER — Ambulatory Visit: Payer: Self-pay | Admitting: Family Medicine

## 2024-10-22 ENCOUNTER — Ambulatory Visit: Payer: Self-pay

## 2024-10-22 ENCOUNTER — Encounter: Payer: Self-pay | Admitting: Family Medicine

## 2024-10-22 VITALS — BP 141/50 | HR 44 | Ht 64.0 in | Wt 125.0 lb

## 2024-10-22 DIAGNOSIS — E039 Hypothyroidism, unspecified: Secondary | ICD-10-CM | POA: Diagnosis not present

## 2024-10-22 DIAGNOSIS — I10 Essential (primary) hypertension: Secondary | ICD-10-CM

## 2024-10-22 DIAGNOSIS — R7303 Prediabetes: Secondary | ICD-10-CM

## 2024-10-22 DIAGNOSIS — I480 Paroxysmal atrial fibrillation: Secondary | ICD-10-CM

## 2024-10-22 DIAGNOSIS — E782 Mixed hyperlipidemia: Secondary | ICD-10-CM | POA: Diagnosis not present

## 2024-10-22 NOTE — Progress Notes (Signed)
 "  BP (!) 141/50   Pulse (!) 44   Ht 5' 4 (1.626 m)   Wt 125 lb (56.7 kg)   SpO2 95%   BMI 21.46 kg/m    Subjective:   Patient ID: April Duke, female    DOB: January 17, 1946, 78 y.o.   MRN: [id number]  HPI: April Duke is a 78 y.o. female presenting on 10/22/2024 for Medical Management of Chronic Issues, Hypertension, and Hypothyroidism   Discussed the use of AI scribe software for clinical note transcription with the patient, who gave verbal consent to proceed.  History of Present Illness   [name] is a 78 year old female with hypertension and atrial fibrillation who presents for a recheck of her thyroid  and blood pressure.  Hypertension - Blood pressure has been stable with home readings typically in the 140s/50s to 60s - Recent blood pressure reading of 140/72 - Currently taking amlodipine , atenolol , and captopril  for blood pressure management  Atrial fibrillation and anticoagulation - History of atrial fibrillation - Currently taking Xarelto  for anticoagulation - No bleeding issues reported  Glycemic control - Recent A1c is 6.0, slightly higher than previous value - Diet includes ice cream and other holiday foods  Constipation - Constipation associated with prior surgery - Diet includes apples but lacks cereals with fiber - Occasional use of Milk of Magnesia - No significant changes in bowel habits aside from constipation  Gastrointestinal symptoms - No bleeding or burning symptoms          Relevant past medical, surgical, family and social history reviewed and updated as indicated. Interim medical history since our last visit reviewed. Allergies and medications reviewed and updated.  Review of Systems  Constitutional:  Negative for chills and fever.  Eyes:  Negative for redness and visual disturbance.  Respiratory:  Negative for chest tightness and shortness of breath.   Cardiovascular:  Negative for chest pain and leg swelling.  Genitourinary:  Negative  for difficulty urinating and dysuria.  Musculoskeletal:  Negative for back pain and gait problem.  Skin:  Negative for rash.  Neurological:  Negative for dizziness, light-headedness and headaches.  Psychiatric/Behavioral:  Negative for agitation and behavioral problems.   All other systems reviewed and are negative.   Per HPI unless specifically indicated above   Allergies as of 10/22/2024       Reactions   Alcohol Palpitations      Sulfa Antibiotics Rash   Ethanol    RAPID HEARTRATE         Medication List        Accurate as of October 22, 2024  1:39 PM. If you have any questions, ask your nurse or doctor.          STOP taking these medications    cephALEXin  500 MG capsule Commonly known as: KEFLEX  Stopped by: Fonda Levins, MD       TAKE these medications    amLODipine  5 MG tablet Commonly known as: NORVASC  Take 1 tablet (5 mg total) by mouth daily.   atenolol  50 MG tablet Commonly known as: TENORMIN  Take 1 tablet by mouth once daily   calcium  carbonate 1500 (600 Ca) MG Tabs tablet Commonly known as: OSCAL Take 1 tablet by mouth daily at 6 (six) AM.   captopril  25 MG tablet Commonly known as: CAPOTEN  Take 1 tablet by mouth once daily   magnesium hydroxide 400 MG/5ML suspension Commonly known as: MILK OF MAGNESIA Take 5 mLs by mouth daily as needed for mild constipation.  multivitamin tablet Take 1 tablet by mouth daily.   rosuvastatin  5 MG tablet Commonly known as: CRESTOR  Take 1 tablet (5 mg total) by mouth daily.   Synthroid  50 MCG tablet Generic drug: levothyroxine  Take 1 tablet (50 mcg total) by mouth daily.   Xarelto  20 MG Tabs tablet Generic drug: rivaroxaban  TAKE 1 TABLET BY MOUTH ONCE DAILY WITH SUPPER         Objective:   BP (!) 141/50   Pulse (!) 44   Ht 5' 4 (1.626 m)   Wt 125 lb (56.7 kg)   SpO2 95%   BMI 21.46 kg/m   Wt Readings from Last 3 Encounters:  10/22/24 125 lb (56.7 kg)  07/25/24 124 lb (56.2  kg)  06/22/24 124 lb (56.2 kg)    Physical Exam Vitals and nursing note reviewed.    Physical Exam   NECK: Thyroid  normal on palpation. CHEST: Lungs clear to auscultation. CARDIOVASCULAR: Heart in atrial fibrillation, rate controlled.       Results for orders placed or performed in visit on 10/17/24  Bayer DCA Hb A1c Waived   Collection Time: 10/17/24  8:56 AM  Result Value Ref Range   HB A1C (BAYER DCA - WAIVED) 6.0 (H) 4.8 - 5.6 %  TSH   Collection Time: 10/17/24  8:58 AM  Result Value Ref Range   TSH 1.760 0.450 - 4.500 uIU/mL  CMP14+EGFR   Collection Time: 10/17/24  8:58 AM  Result Value Ref Range   Glucose 120 (H) 70 - 99 mg/dL   BUN 8 8 - 27 mg/dL   Creatinine, Ser 9.28 0.57 - 1.00 mg/dL   eGFR 86 >40 fO/fpw/8.26   BUN/Creatinine Ratio 11 (L) 12 - 28   Sodium 132 (L) 134 - 144 mmol/L   Potassium 4.4 3.5 - 5.2 mmol/L   Chloride 96 96 - 106 mmol/L   CO2 23 20 - 29 mmol/L   Calcium  10.0 8.7 - 10.3 mg/dL   Total Protein 7.6 6.0 - 8.5 g/dL   Albumin 4.6 3.8 - 4.8 g/dL   Globulin, Total 3.0 1.5 - 4.5 g/dL   Bilirubin Total 0.6 0.0 - 1.2 mg/dL   Alkaline Phosphatase 106 49 - 135 IU/L   AST 17 0 - 40 IU/L   ALT 7 0 - 32 IU/L  Lipid panel   Collection Time: 10/17/24  8:58 AM  Result Value Ref Range   Cholesterol, Total 138 100 - 199 mg/dL   Triglycerides 73 0 - 149 mg/dL   HDL 54 >60 mg/dL   VLDL Cholesterol Cal 15 5 - 40 mg/dL   LDL Chol Calc (NIH) 69 0 - 99 mg/dL   Chol/HDL Ratio 2.6 0.0 - 4.4 ratio  CBC with Differential/Platelet   Collection Time: 10/17/24  8:58 AM  Result Value Ref Range   WBC 6.0 3.4 - 10.8 x10E3/uL   RBC 4.56 3.77 - 5.28 x10E6/uL   Hemoglobin 13.9 11.1 - 15.9 g/dL   Hematocrit 58.0 65.9 - 46.6 %   MCV 92 79 - 97 fL   MCH 30.5 26.6 - 33.0 pg   MCHC 33.2 31.5 - 35.7 g/dL   RDW 86.7 88.2 - 84.5 %   Platelets 241 150 - 450 x10E3/uL   Neutrophils 54 Not Estab. %   Lymphs 25 Not Estab. %   Monocytes 14 Not Estab. %   Eos 6 Not Estab. %    Basos 1 Not Estab. %   Neutrophils Absolute 3.3 1.4 - 7.0 x10E3/uL  Lymphocytes Absolute 1.5 0.7 - 3.1 x10E3/uL   Monocytes Absolute 0.8 0.1 - 0.9 x10E3/uL   EOS (ABSOLUTE) 0.4 0.0 - 0.4 x10E3/uL   Basophils Absolute 0.1 0.0 - 0.2 x10E3/uL   Immature Granulocytes 0 Not Estab. %   Immature Grans (Abs) 0.0 0.0 - 0.1 x10E3/uL    Assessment & Plan:   Problem List Items Addressed This Visit       Cardiovascular and Mediastinum   HTN (hypertension) - Primary   Relevant Orders   Bayer DCA Hb A1c Waived   CMP14+EGFR   CBC With Diff/Platelet   Lipid panel   TSH   A-fib (HCC)   Relevant Orders   Bayer DCA Hb A1c Waived   CMP14+EGFR   CBC With Diff/Platelet   Lipid panel   TSH     Endocrine   Hypothyroid   Relevant Orders   Bayer DCA Hb A1c Waived   CMP14+EGFR   CBC With Diff/Platelet   Lipid panel   TSH     Other   Hyperlipemia   Relevant Orders   Bayer DCA Hb A1c Waived   CMP14+EGFR   CBC With Diff/Platelet   Lipid panel   TSH   Prediabetes   Relevant Orders   Bayer DCA Hb A1c Waived   CMP14+EGFR   CBC With Diff/Platelet   Lipid panel   TSH        Primary hypertension Blood pressure controlled with current medications. - Continue amlodipine , atenolol , and captopril .  Chronic atrial fibrillation Atrial fibrillation controlled with stable heart rate. Occasional skipped beats not concerning. - Continue current management and monitoring.  Acquired hypothyroidism Thyroid  function tests normal, indicating well-managed hypothyroidism.  Mixed hyperlipidemia Cholesterol levels well-controlled, particularly LDL. - Continue current lipid management.  Impaired fasting glucose A1c at 6.0, slightly higher but acceptable. Fasting glucose at 120, likely due to recent diet. Emphasized dietary control. - Continue monitoring blood glucose levels. - Encouraged dietary modifications to maintain glucose control.  Constipation Likely related to dietary habits and  post-surgical changes. Managed with dietary fiber. - Recommended daily fiber supplement such as Metamucil. - Advised increased water  intake. - Consider Docusate if needed. - Use Milk of Magnesia sparingly, not more than once a week.          Follow up plan: Return in about 4 months (around 02/20/2025), or if symptoms worsen or fail to improve, for Thyroid  and hypertension recheck.  Counseling provided for all of the vaccine components Orders Placed This Encounter  Procedures   Bayer DCA Hb A1c Waived   CMP14+EGFR   CBC With Diff/Platelet   Lipid panel   TSH    Fonda Levins, MD [facility] 10/22/2024, 1:39 PM     "

## 2024-11-06 ENCOUNTER — Other Ambulatory Visit: Payer: Self-pay | Admitting: Cardiology

## 2024-11-06 DIAGNOSIS — I480 Paroxysmal atrial fibrillation: Secondary | ICD-10-CM

## 2024-11-07 NOTE — Telephone Encounter (Signed)
 Xarelto  20mg  refill request received. Pt is 79 years old, weight-56.7kg, Crea-0.71 on 10/17/24, last seen by Dr. Lavona on 02/14/24, Diagnosis-Afib, CrCl- 57.51 mL/min; Dose is appropriate based on dosing criteria. Will send in refill to requested pharmacy.     Placed a note on refill to call to schedule annual appointment since due in April.

## 2025-02-20 ENCOUNTER — Ambulatory Visit: Admitting: Family Medicine
